# Patient Record
Sex: Male | Born: 1935 | Race: White | Hispanic: No | Marital: Single | Smoking: Former smoker
Health system: Southern US, Community
[De-identification: ages and names within clinical notes are randomized; demographics above are authoritative.]

## PROBLEM LIST (undated history)

## (undated) DIAGNOSIS — K7010 Alcoholic hepatitis without ascites: Secondary | ICD-10-CM

## (undated) DIAGNOSIS — F419 Anxiety disorder, unspecified: Secondary | ICD-10-CM

## (undated) DIAGNOSIS — J449 Chronic obstructive pulmonary disease, unspecified: Secondary | ICD-10-CM

## (undated) DIAGNOSIS — C44621 Squamous cell carcinoma of skin of unspecified upper limb, including shoulder: Secondary | ICD-10-CM

## (undated) DIAGNOSIS — I10 Essential (primary) hypertension: Secondary | ICD-10-CM

## (undated) DIAGNOSIS — F32A Depression, unspecified: Secondary | ICD-10-CM

## (undated) DIAGNOSIS — E785 Hyperlipidemia, unspecified: Secondary | ICD-10-CM

## (undated) DIAGNOSIS — K8050 Calculus of bile duct without cholangitis or cholecystitis without obstruction: Secondary | ICD-10-CM

## (undated) DIAGNOSIS — F329 Major depressive disorder, single episode, unspecified: Secondary | ICD-10-CM

## (undated) DIAGNOSIS — F1910 Other psychoactive substance abuse, uncomplicated: Secondary | ICD-10-CM

## (undated) DIAGNOSIS — N400 Enlarged prostate without lower urinary tract symptoms: Secondary | ICD-10-CM

## (undated) DIAGNOSIS — C4491 Basal cell carcinoma of skin, unspecified: Secondary | ICD-10-CM

## (undated) DIAGNOSIS — C4492 Squamous cell carcinoma of skin, unspecified: Secondary | ICD-10-CM

## (undated) DIAGNOSIS — I714 Abdominal aortic aneurysm, without rupture: Secondary | ICD-10-CM

## (undated) DIAGNOSIS — D649 Anemia, unspecified: Secondary | ICD-10-CM

## (undated) DIAGNOSIS — M1990 Unspecified osteoarthritis, unspecified site: Secondary | ICD-10-CM

## (undated) DIAGNOSIS — J189 Pneumonia, unspecified organism: Secondary | ICD-10-CM

## (undated) DIAGNOSIS — K219 Gastro-esophageal reflux disease without esophagitis: Secondary | ICD-10-CM

## (undated) DIAGNOSIS — G5630 Lesion of radial nerve, unspecified upper limb: Secondary | ICD-10-CM

## (undated) DIAGNOSIS — D334 Benign neoplasm of spinal cord: Secondary | ICD-10-CM

## (undated) HISTORY — DX: Depression, unspecified: F32.A

## (undated) HISTORY — DX: Benign neoplasm of spinal cord: D33.4

## (undated) HISTORY — DX: Anxiety disorder, unspecified: F41.9

## (undated) HISTORY — DX: Alcoholic hepatitis without ascites: K70.10

## (undated) HISTORY — DX: Basal cell carcinoma of skin, unspecified: C44.91

## (undated) HISTORY — DX: Squamous cell carcinoma of skin, unspecified: C44.92

## (undated) HISTORY — DX: Squamous cell carcinoma of skin of unspecified upper limb, including shoulder: C44.621

## (undated) HISTORY — DX: Calculus of bile duct without cholangitis or cholecystitis without obstruction: K80.50

## (undated) HISTORY — DX: Chronic obstructive pulmonary disease, unspecified: J44.9

## (undated) HISTORY — DX: Benign prostatic hyperplasia without lower urinary tract symptoms: N40.0

## (undated) HISTORY — DX: Hyperlipidemia, unspecified: E78.5

## (undated) HISTORY — DX: Major depressive disorder, single episode, unspecified: F32.9

## (undated) HISTORY — DX: Other psychoactive substance abuse, uncomplicated: F19.10

## (undated) HISTORY — DX: Essential (primary) hypertension: I10

## (undated) HISTORY — DX: Pneumonia, unspecified organism: J18.9

## (undated) HISTORY — DX: Lesion of radial nerve, unspecified upper limb: G56.30

## (undated) HISTORY — DX: Anemia, unspecified: D64.9

## (undated) HISTORY — DX: Abdominal aortic aneurysm, without rupture: I71.4

## (undated) NOTE — *Deleted (*Deleted)
Chronic Care Management Pharmacy Assistant   Name: Justin Benson  MRN: [id number] DOB: February 11, 1935  Reason for Encounter: Disease State and Medication Review  Patient Questions:  1.  Have you seen any other providers since your last visit? Yes, 05/10/2020- [name] (PCP)  2.  Any changes in your medicines or health? Yes, Amlodipine 10 mg discontinued 05/10/2020.   PCP : [name], MD  Allergies:   Allergies  Allergen Reactions   Amlodipine Other (See Comments)    transaminitis    Medications: Outpatient Encounter Medications as of 05/31/2020  Medication Sig   atorvastatin (LIPITOR) 10 MG tablet Take 0.5 tablets (5 mg total) by mouth daily.   famotidine (PEPCID) 20 MG tablet TAKE 1 TABLET BY MOUTH EVERYDAY AT BEDTIME   ferrous sulfate 325 (65 FE) MG tablet Take 1 tablet (325 mg total) by mouth every other day.   finasteride (PROSCAR) 5 MG tablet TAKE 1 TABLET BY MOUTH IN  THE EVENING   levothyroxine (SYNTHROID) 25 MCG tablet TAKE 1 TABLET BY MOUTH  DAILY BEFORE BREAKFAST   metFORMIN (GLUCOPHAGE-XR) 500 MG 24 hr tablet Take 2 tablets (1,000 mg total) by mouth 2 (two) times daily.   Multiple Vitamin (MULTIVITAMIN WITH MINERALS) TABS Take 1 tablet by mouth daily.   omeprazole (PRILOSEC) 40 MG capsule Take 1 capsule (40 mg total) by mouth daily.   polyethylene glycol powder (GLYCOLAX/MIRALAX) 17 GM/SCOOP powder Take 8.5 g by mouth daily as needed for moderate constipation. Hold for diarrhea   quinapril (ACCUPRIL) 20 MG tablet TAKE 1 TABLET BY MOUTH AT  BEDTIME   quinapril-hydrochlorothiazide (ACCURETIC) 20-25 MG tablet Take 1 tablet by mouth daily.   sertraline (ZOLOFT) 50 MG tablet Take 1 tablet (50 mg total) by mouth daily.   No facility-administered encounter medications on file as of 05/31/2020.    Current Diagnosis: Patient Active Problem List   Diagnosis Date Noted   Hoarseness 05/10/2020   Constipation 02/28/2020   Carotid stenosis 09/12/2019    Interstitial lung disease ([place]) 09/09/2019   Dysphagia 09/06/2019   Nausea 09/06/2019   Bilateral leg edema 02/09/2019   Iron deficiency anemia 08/30/2017   Borderline hypothyroidism 08/30/2017   Kidney cyst, acquired 05/26/2017   Chronic lower back pain 06/24/2016   Serous otitis media with rupture of tympanic membrane 03/28/2016   Abdominal aortic atherosclerosis ([place]) 06/05/2014   Advanced care planning/counseling discussion 17/49/4496   Alcoholic hepatitis without ascites 06/02/2014   Health maintenance examination 06/02/2014   Medicare annual wellness visit, subsequent 06/01/2013   Weight loss 01/11/2013   Transaminitis 12/01/2012   Right shoulder pain 11/30/2012   Schwannoma of spinal cord ([place]) 11/26/2012   DIVERTICULOSIS OF COLON 12/22/2008   Hyperlipidemia associated with type 2 diabetes mellitus ([place]) 01/10/2007   Alcohol use disorder, severe, in sustained remission ([place]) 01/10/2007   Ex-smoker 01/10/2007   MDD (major depressive disorder), recurrent episode, moderate ([place]) 01/10/2007   COPD (chronic obstructive pulmonary disease) ([place]) 01/10/2007   Benign prostatic hyperplasia 01/10/2007   Type 2 diabetes mellitus with other specified complication ([place]) 75/91/6384   Essential hypertension 01/09/2007    Goals Addressed             This Visit's Progress    Pharmacy Care Plan   Not on track    CARE PLAN ENTRY  Current Barriers:  Chronic Disease Management support, education, and care coordination needs related to Hypertension and Diabetes   Hypertension BP Readings from Last 3 Encounters:  01/18/20 132/60  01/06/20 Marland Kitchen)  142/66  11/18/19 (!) 169/70  Home blood pressure monitoring: weekly basis, 120-130s/60s Pharmacist Clinical Goal(s): Over the next 30 days, patient will work with PharmD and providers to maintain BP goal <140/90 mmHg Current regimen:  Quinapril 20 mg/HCTZ 25 mg - 1 tablet daily Quinapril 20 mg - 1 tablet daily Interventions: Reviewed home  blood pressure monitoring. Blood pressures consistently 130-140s/50-60s Pulse 50-60s. .  Discussed amlodipine discontinuation and patient's choice to defer carvedilol.  Patient self care activities - Over the next 30 days, patient will: Continue current medications as prescribed   Diabetes Lab Results  Component Value Date/Time   HGBA1C 7.4 (H) 09/02/2019 09:03 AM   HGBA1C 7.3 (H) 05/07/2019 11:46 AM  Pharmacist Clinical Goal(s): Over the next 30 days, patient will work with PharmD and providers to achieve A1c goal <7% Current regimen:  Metformin 500 mg XR - 2 tablets twice daily with meals  Interventions: Reviewed blood sugar readings. Fasting readings range: 94-137. Postprandial reading range: 115-219. The majority of patient's readings are at or below goal. Patient identified things that contributed to above goal readings.  Patient self care activities - Over the next 30 days, patient will: Continue to check blood sugar daily. Contact provider with any episodes of hypoglycemia (blood glucose < 70)  Schedule comprehensive eye exam   Please see past updates related to this goal by clicking on the "Past Updates" button in the selected goal        Reviewed chart prior to disease state call. Spoke with patient regarding BP  Recent Office Vitals: BP Readings from Last 3 Encounters:  05/10/20 (!) 150/66  03/22/20 (!) 150/60  02/28/20 (!) 154/62   Pulse Readings from Last 3 Encounters:  05/10/20 66  03/22/20 65  02/28/20 73    Wt Readings from Last 3 Encounters:  05/10/20 128 lb 3 oz (58.1 kg)  03/22/20 127 lb (57.6 kg)  02/28/20 129 lb 3 oz (58.6 kg)     Kidney Function Lab Results  Component Value Date/Time   CREATININE 0.80 09/02/2019 09:03 AM   CREATININE 0.75 05/07/2019 11:46 AM   GFR 92.50 09/02/2019 09:03 AM   GFRNONAA 90 (L) 02/26/2013 11:00 AM   GFRAA >90 02/26/2013 11:00 AM    BMP Latest Ref Rng & Units 09/02/2019 05/07/2019 02/09/2019  Glucose 70 - 99 mg/dL  151(H) 106(H) 116(H)  BUN 6 - 23 mg/dL _0 Creatinine 0.40 - 1.50 mg/dL 0.80 0.75 0.76  Sodium 135 - 145 mEq/L 137 138 138  Potassium 3.5 - 5.1 mEq/L 4.5 4.4 4.9  Chloride 96 - 112 mEq/L 100 99 99  CO2 19 - 32 mEq/L 33(H) 33(H) 34(H)  Calcium 8.4 - 10.5 mg/dL 9.5 9.9 9.6    Current antihypertensive regimen:  Quinapril 20 mg/HCTZ 25 mg- 1 tablet daily Quinapril 20 mg- 1 tablet daily How often are you checking your Blood Pressure? twice daily Current home BP readings: 166/68- morning reading, 145/62- afternoon reading, 170/82- morning reading, 137/61- afternoon reading. What recent interventions/DTPs have been made by any provider to improve Blood Pressure control since last CPP Visit: Amlodipine discontinued by PCP on 05/10/2020, asked to bring BP cuff to next appointment to compare readings. Any recent hospitalizations or ED visits since last visit with CPP? No What diet changes have been made to improve Blood Pressure Control?  Patient states he eats salads for lunch, banana and coffee in the morning with medications. What exercise is being done to improve your Blood Pressure Control?  Patient  did not mention any particular exercise routines but he states he keep pretty busy, takes care of himself.  Adherence Review: Is the patient currently on ACE/ARB medication? Yes- Quinapril  Does the patient have >5 day gap between last estimated fill dates? No  Per Insurance Adherence Review data: ? Medication Adherence for Cholesterol (Statins) (MAC)--- Met: Med Compliance CHOL:  90-99% ? Medication Adherence for Diabetes Medications (MAD)--- Met:  Med Compliance DM:  80-89% ? Medication Adherence for Hypertension (RAS antagonists) [facility])--- Met: Med Compliance HTN:  90-99%    Follow-Up:  Pharmacist Review- Follow up with patient on elevated blood pressure from Medication dispensing call. Patient states his blood pressure are still elevated, he finds that he feel anxious a lot and mostly  when he wakes up, he lives by himself and has to take care of every household chores himself. Patient agreed to check his blood pressure daily and record. Patient aware we will follow up with him next week for Hypertension adherence call.  Debbora Dus, CPP notified.  06/05/2020- Patient called stating when we were reviewing medications on 05/30/2020 for delivery he forgot to mention that he is taking Finasteride 5 mg- 1 tablet daily. Informed patient that medication was not on his profile with [facility] and inquired how many pill he had left. Patient stated he has 2 bottles of this medication from previous pharmacy, he will count pills and call me back.  06/06/2020- Patient returned call and stated he had a total of 93 pills on hand. Informed patient that we will follow up with him monthly to see how many pills he has left. Informed that I will send request to pharmacist Debbora Dus to request PCP to send a prescription to [facility] pharmacy to place on hold for when he needs. Patient agrees with plan and very thankful for the help. Debbora Dus notified.  Pattricia Boss, [place] Pharmacist Assistant [phone]

---

## 1994-07-29 DIAGNOSIS — I10 Essential (primary) hypertension: Secondary | ICD-10-CM

## 1994-07-29 HISTORY — DX: Essential (primary) hypertension: I10

## 1998-04-28 ENCOUNTER — Encounter: Payer: Self-pay | Admitting: Family Medicine

## 2000-02-27 ENCOUNTER — Encounter: Payer: Self-pay | Admitting: Family Medicine

## 2000-02-27 LAB — CONVERTED CEMR LAB
Hgb A1c MFr Bld: 6 %
PSA: 0.6 ng/mL

## 2000-08-29 ENCOUNTER — Encounter: Payer: Self-pay | Admitting: Family Medicine

## 2001-09-26 ENCOUNTER — Encounter: Payer: Self-pay | Admitting: Family Medicine

## 2002-03-29 ENCOUNTER — Encounter: Payer: Self-pay | Admitting: Family Medicine

## 2002-09-27 ENCOUNTER — Encounter: Payer: Self-pay | Admitting: Family Medicine

## 2002-12-28 ENCOUNTER — Encounter: Payer: Self-pay | Admitting: Family Medicine

## 2002-12-28 LAB — CONVERTED CEMR LAB: Hgb A1c MFr Bld: 7.1 %

## 2003-06-29 ENCOUNTER — Encounter: Payer: Self-pay | Admitting: Family Medicine

## 2003-06-29 LAB — CONVERTED CEMR LAB: Hgb A1c MFr Bld: 6.2 %

## 2003-09-27 ENCOUNTER — Encounter: Payer: Self-pay | Admitting: Family Medicine

## 2003-09-27 LAB — CONVERTED CEMR LAB: Microalbumin U total vol: 3.1 mg/L

## 2003-12-28 ENCOUNTER — Encounter: Payer: Self-pay | Admitting: Family Medicine

## 2003-12-28 LAB — CONVERTED CEMR LAB
Hgb A1c MFr Bld: 6.3 %
PSA: 0.9 ng/mL

## 2004-04-28 ENCOUNTER — Encounter: Payer: Self-pay | Admitting: Family Medicine

## 2004-06-14 ENCOUNTER — Ambulatory Visit: Payer: Self-pay | Admitting: Family Medicine

## 2004-06-28 ENCOUNTER — Encounter: Payer: Self-pay | Admitting: Family Medicine

## 2004-06-28 LAB — CONVERTED CEMR LAB: Hgb A1c MFr Bld: 7.4 %

## 2004-07-24 ENCOUNTER — Ambulatory Visit: Payer: Self-pay | Admitting: Family Medicine

## 2004-07-26 ENCOUNTER — Ambulatory Visit: Payer: Self-pay | Admitting: Family Medicine

## 2004-09-05 ENCOUNTER — Ambulatory Visit: Payer: Self-pay | Admitting: Family Medicine

## 2004-09-24 ENCOUNTER — Ambulatory Visit: Payer: Self-pay | Admitting: Family Medicine

## 2004-09-24 ENCOUNTER — Encounter: Admission: RE | Admit: 2004-09-24 | Discharge: 2004-09-24 | Payer: Self-pay | Admitting: Family Medicine

## 2004-11-26 ENCOUNTER — Encounter: Payer: Self-pay | Admitting: Family Medicine

## 2004-11-26 LAB — CONVERTED CEMR LAB: Hgb A1c MFr Bld: 5.9 %

## 2004-11-29 ENCOUNTER — Ambulatory Visit: Payer: Self-pay | Admitting: Family Medicine

## 2004-12-06 ENCOUNTER — Ambulatory Visit: Payer: Self-pay | Admitting: Family Medicine

## 2005-04-28 ENCOUNTER — Encounter: Payer: Self-pay | Admitting: Family Medicine

## 2005-05-23 ENCOUNTER — Ambulatory Visit: Payer: Self-pay | Admitting: Family Medicine

## 2005-05-27 ENCOUNTER — Ambulatory Visit: Payer: Self-pay | Admitting: Family Medicine

## 2005-10-23 ENCOUNTER — Ambulatory Visit: Payer: Self-pay | Admitting: Family Medicine

## 2005-11-21 ENCOUNTER — Ambulatory Visit: Payer: Self-pay | Admitting: Family Medicine

## 2005-11-22 ENCOUNTER — Ambulatory Visit: Payer: Self-pay | Admitting: Family Medicine

## 2005-11-25 ENCOUNTER — Ambulatory Visit: Payer: Self-pay | Admitting: Family Medicine

## 2006-01-10 ENCOUNTER — Ambulatory Visit: Payer: Self-pay | Admitting: Family Medicine

## 2006-01-15 ENCOUNTER — Ambulatory Visit: Payer: Self-pay | Admitting: Family Medicine

## 2006-04-28 ENCOUNTER — Encounter: Payer: Self-pay | Admitting: Family Medicine

## 2006-05-26 ENCOUNTER — Ambulatory Visit: Payer: Self-pay | Admitting: Family Medicine

## 2006-05-29 ENCOUNTER — Ambulatory Visit: Payer: Self-pay | Admitting: Family Medicine

## 2006-06-28 ENCOUNTER — Encounter: Payer: Self-pay | Admitting: Family Medicine

## 2006-06-28 LAB — CONVERTED CEMR LAB: Hgb A1c MFr Bld: 6.3 %

## 2006-07-08 ENCOUNTER — Ambulatory Visit: Payer: Self-pay | Admitting: Family Medicine

## 2006-07-10 ENCOUNTER — Ambulatory Visit: Payer: Self-pay | Admitting: Family Medicine

## 2006-09-27 ENCOUNTER — Encounter: Payer: Self-pay | Admitting: Family Medicine

## 2006-10-10 ENCOUNTER — Ambulatory Visit: Payer: Self-pay | Admitting: Family Medicine

## 2006-10-10 LAB — CONVERTED CEMR LAB
ALT: 36 units/L (ref 0–40)
AST: 33 units/L (ref 0–37)
BUN: 18 mg/dL (ref 6–23)
Calcium: 9.1 mg/dL (ref 8.4–10.5)
Chloride: 105 meq/L (ref 96–112)
Creatinine, Ser: 0.9 mg/dL (ref 0.4–1.5)
PSA: 1.32 ng/mL (ref 0.10–4.00)
Potassium: 4.7 meq/L (ref 3.5–5.1)
VLDL: 17 mg/dL (ref 0–40)

## 2006-10-14 ENCOUNTER — Ambulatory Visit: Payer: Self-pay | Admitting: Family Medicine

## 2006-11-27 ENCOUNTER — Ambulatory Visit: Payer: Self-pay | Admitting: Family Medicine

## 2007-01-08 ENCOUNTER — Ambulatory Visit: Payer: Self-pay | Admitting: Family Medicine

## 2007-01-08 LAB — CONVERTED CEMR LAB
Bilirubin Urine: NEGATIVE
Glucose, Urine, Semiquant: NEGATIVE
Hgb A1c MFr Bld: 6 % (ref 4.6–6.0)
Mucus, UA: 0
Nitrite: NEGATIVE
Protein, U semiquant: NEGATIVE
Specific Gravity, Urine: <1.005
Urine crystals, microscopic: 0 /hpf
Urobilinogen, UA: 0.2
pH: 7

## 2007-01-09 ENCOUNTER — Encounter: Payer: Self-pay | Admitting: Family Medicine

## 2007-01-09 DIAGNOSIS — I10 Essential (primary) hypertension: Secondary | ICD-10-CM | POA: Insufficient documentation

## 2007-01-09 DIAGNOSIS — E1169 Type 2 diabetes mellitus with other specified complication: Secondary | ICD-10-CM

## 2007-01-10 DIAGNOSIS — F1010 Alcohol abuse, uncomplicated: Secondary | ICD-10-CM

## 2007-01-10 DIAGNOSIS — N400 Enlarged prostate without lower urinary tract symptoms: Secondary | ICD-10-CM

## 2007-01-10 DIAGNOSIS — Z87891 Personal history of nicotine dependence: Secondary | ICD-10-CM | POA: Insufficient documentation

## 2007-01-10 DIAGNOSIS — F331 Major depressive disorder, recurrent, moderate: Secondary | ICD-10-CM

## 2007-01-10 DIAGNOSIS — E785 Hyperlipidemia, unspecified: Secondary | ICD-10-CM

## 2007-01-10 DIAGNOSIS — J449 Chronic obstructive pulmonary disease, unspecified: Secondary | ICD-10-CM

## 2007-01-12 ENCOUNTER — Ambulatory Visit: Payer: Self-pay | Admitting: Family Medicine

## 2007-05-04 ENCOUNTER — Ambulatory Visit: Payer: Self-pay | Admitting: Family Medicine

## 2007-05-12 ENCOUNTER — Ambulatory Visit: Payer: Self-pay | Admitting: Family Medicine

## 2007-05-12 DIAGNOSIS — K921 Melena: Secondary | ICD-10-CM | POA: Insufficient documentation

## 2007-06-15 ENCOUNTER — Ambulatory Visit: Payer: Self-pay | Admitting: Family Medicine

## 2007-06-15 LAB — CONVERTED CEMR LAB
ALT: 26 units/L (ref 0–53)
AST: 20 units/L (ref 0–37)
Albumin: 4 g/dL (ref 3.5–5.2)
Basophils Absolute: 0 10*3/uL (ref 0.0–0.1)
Calcium: 9.2 mg/dL (ref 8.4–10.5)
Chloride: 102 meq/L (ref 96–112)
Creatinine, Ser: 0.9 mg/dL (ref 0.4–1.5)
Creatinine,U: 119.2 mg/dL
Eosinophils Relative: 1.6 % (ref 0.0–5.0)
HCT: 43 % (ref 39.0–52.0)
LDL Cholesterol: 73 mg/dL (ref 0–99)
MCHC: 33.5 g/dL (ref 30.0–36.0)
Neutrophils Relative %: 52.4 % (ref 43.0–77.0)
PSA: 1.37 ng/mL (ref 0.10–4.00)
Platelets: 228 10*3/uL (ref 150–400)
RBC: 4.65 M/uL (ref 4.22–5.81)
RDW: 12.9 % (ref 11.5–14.6)
Sodium: 138 meq/L (ref 135–145)
TSH: 2.7 microintl units/mL (ref 0.35–5.50)
Total Bilirubin: 0.7 mg/dL (ref 0.3–1.2)
Total CHOL/HDL Ratio: 3.6
WBC: 4 10*3/uL — ABNORMAL LOW (ref 4.5–10.5)

## 2007-06-17 ENCOUNTER — Ambulatory Visit: Payer: Self-pay | Admitting: Family Medicine

## 2007-06-18 ENCOUNTER — Encounter: Payer: Self-pay | Admitting: Family Medicine

## 2007-07-17 ENCOUNTER — Ambulatory Visit: Payer: Self-pay | Admitting: Family Medicine

## 2007-07-20 ENCOUNTER — Encounter (INDEPENDENT_AMBULATORY_CARE_PROVIDER_SITE_OTHER): Payer: Self-pay | Admitting: *Deleted

## 2007-11-30 ENCOUNTER — Telehealth: Payer: Self-pay | Admitting: Family Medicine

## 2007-12-09 ENCOUNTER — Ambulatory Visit: Payer: Self-pay | Admitting: Family Medicine

## 2007-12-09 LAB — CONVERTED CEMR LAB: Hgb A1c MFr Bld: 6.3 % — ABNORMAL HIGH (ref 4.6–6.0)

## 2007-12-14 ENCOUNTER — Ambulatory Visit: Payer: Self-pay | Admitting: Family Medicine

## 2008-05-24 ENCOUNTER — Encounter (INDEPENDENT_AMBULATORY_CARE_PROVIDER_SITE_OTHER): Payer: Self-pay | Admitting: *Deleted

## 2008-06-27 ENCOUNTER — Ambulatory Visit: Payer: Self-pay | Admitting: Family Medicine

## 2008-06-27 LAB — CONVERTED CEMR LAB
ALT: 61 units/L — ABNORMAL HIGH (ref 0–53)
Basophils Absolute: 0 10*3/uL (ref 0.0–0.1)
Basophils Relative: 0.1 % (ref 0.0–3.0)
Bilirubin, Direct: 0.1 mg/dL (ref 0.0–0.3)
Calcium: 9.1 mg/dL (ref 8.4–10.5)
Cholesterol: 170 mg/dL (ref 0–200)
Creatinine, Ser: 0.9 mg/dL (ref 0.4–1.5)
Creatinine,U: 226.1 mg/dL
GFR calc Af Amer: 107 mL/min
HCT: 42 % (ref 39.0–52.0)
Hemoglobin: 14.5 g/dL (ref 13.0–17.0)
MCHC: 34.6 g/dL (ref 30.0–36.0)
MCV: 89.8 fL (ref 78.0–100.0)
Microalb Creat Ratio: 4 mg/g (ref 0.0–30.0)
Microalb, Ur: 0.9 mg/dL (ref 0.0–1.9)
Monocytes Absolute: 0.6 10*3/uL (ref 0.1–1.0)
Neutro Abs: 1.9 10*3/uL (ref 1.4–7.7)
PSA: 1.17 ng/mL (ref 0.10–4.00)
RBC: 4.67 M/uL (ref 4.22–5.81)
RDW: 13.6 % (ref 11.5–14.6)
Sodium: 143 meq/L (ref 135–145)
TSH: 2.61 microintl units/mL (ref 0.35–5.50)
Total Bilirubin: 0.9 mg/dL (ref 0.3–1.2)
Total Protein: 6.8 g/dL (ref 6.0–8.3)
Triglycerides: 140 mg/dL (ref 0–149)

## 2008-06-29 ENCOUNTER — Ambulatory Visit: Payer: Self-pay | Admitting: Family Medicine

## 2008-11-01 ENCOUNTER — Telehealth: Payer: Self-pay | Admitting: Family Medicine

## 2008-11-09 ENCOUNTER — Ambulatory Visit: Payer: Self-pay | Admitting: Family Medicine

## 2008-11-09 LAB — FECAL OCCULT BLOOD, GUAIAC: Fecal Occult Blood: POSITIVE

## 2008-11-09 LAB — CONVERTED CEMR LAB: OCCULT 3: POSITIVE

## 2008-11-10 ENCOUNTER — Ambulatory Visit: Payer: Self-pay | Admitting: Family Medicine

## 2008-11-26 HISTORY — PX: COLONOSCOPY: SHX174

## 2008-12-20 ENCOUNTER — Ambulatory Visit: Payer: Self-pay | Admitting: Gastroenterology

## 2008-12-21 ENCOUNTER — Encounter: Payer: Self-pay | Admitting: Gastroenterology

## 2008-12-21 ENCOUNTER — Ambulatory Visit: Payer: Self-pay | Admitting: Gastroenterology

## 2008-12-21 LAB — HM COLONOSCOPY

## 2008-12-22 DIAGNOSIS — D126 Benign neoplasm of colon, unspecified: Secondary | ICD-10-CM

## 2008-12-22 DIAGNOSIS — K5730 Diverticulosis of large intestine without perforation or abscess without bleeding: Secondary | ICD-10-CM | POA: Insufficient documentation

## 2008-12-26 ENCOUNTER — Encounter: Payer: Self-pay | Admitting: Gastroenterology

## 2008-12-28 ENCOUNTER — Ambulatory Visit: Payer: Self-pay | Admitting: Family Medicine

## 2008-12-28 LAB — CONVERTED CEMR LAB: Hgb A1c MFr Bld: 6.7 % — ABNORMAL HIGH (ref 4.6–6.5)

## 2009-01-02 ENCOUNTER — Ambulatory Visit: Payer: Self-pay | Admitting: Family Medicine

## 2009-01-16 ENCOUNTER — Ambulatory Visit: Payer: Self-pay | Admitting: Family Medicine

## 2009-04-05 ENCOUNTER — Ambulatory Visit: Payer: Self-pay | Admitting: Family Medicine

## 2009-04-19 ENCOUNTER — Ambulatory Visit: Payer: Self-pay | Admitting: Family Medicine

## 2009-07-17 ENCOUNTER — Ambulatory Visit: Payer: Self-pay | Admitting: Family Medicine

## 2009-07-17 LAB — CONVERTED CEMR LAB
ALT: 42 units/L (ref 0–53)
AST: 27 units/L (ref 0–37)
Alkaline Phosphatase: 55 units/L (ref 39–117)
BUN: 17 mg/dL (ref 6–23)
Basophils Relative: 0.1 % (ref 0.0–3.0)
Chloride: 102 meq/L (ref 96–112)
Creatinine,U: 107.5 mg/dL
Eosinophils Absolute: 0.1 10*3/uL (ref 0.0–0.7)
Eosinophils Relative: 1.5 % (ref 0.0–5.0)
GFR calc non Af Amer: 88.14 mL/min (ref >60)
Hemoglobin: 14.4 g/dL (ref 13.0–17.0)
Hgb A1c MFr Bld: 6.3 % (ref 4.6–6.5)
LDL Cholesterol: 70 mg/dL (ref 0–99)
Lymphocytes Relative: 23.2 % (ref 12.0–46.0)
MCHC: 33.6 g/dL (ref 30.0–36.0)
Microalb, Ur: 0.3 mg/dL (ref 0.0–1.9)
Monocytes Relative: 14.2 % — ABNORMAL HIGH (ref 3.0–12.0)
Neutro Abs: 3.2 10*3/uL (ref 1.4–7.7)
Neutrophils Relative %: 61 % (ref 43.0–77.0)
PSA: 1.24 ng/mL (ref 0.10–4.00)
Potassium: 4.6 meq/L (ref 3.5–5.1)
RBC: 4.53 M/uL (ref 4.22–5.81)
Sodium: 140 meq/L (ref 135–145)
Total Bilirubin: 0.8 mg/dL (ref 0.3–1.2)
Total CHOL/HDL Ratio: 3
VLDL: 25.8 mg/dL (ref 0.0–40.0)
WBC: 5.2 10*3/uL (ref 4.5–10.5)

## 2009-08-14 ENCOUNTER — Telehealth: Payer: Self-pay | Admitting: Family Medicine

## 2009-08-28 ENCOUNTER — Ambulatory Visit: Payer: Self-pay | Admitting: Family Medicine

## 2009-10-23 ENCOUNTER — Encounter: Payer: Self-pay | Admitting: Family Medicine

## 2009-10-30 ENCOUNTER — Ambulatory Visit (HOSPITAL_BASED_OUTPATIENT_CLINIC_OR_DEPARTMENT_OTHER): Admission: RE | Admit: 2009-10-30 | Discharge: 2009-10-30 | Payer: Self-pay | Admitting: Urology

## 2009-10-30 HISTORY — PX: CIRCUMCISION: SUR203

## 2009-11-07 ENCOUNTER — Encounter: Payer: Self-pay | Admitting: Family Medicine

## 2009-11-22 ENCOUNTER — Telehealth: Payer: Self-pay | Admitting: Family Medicine

## 2009-12-19 ENCOUNTER — Encounter: Payer: Self-pay | Admitting: Family Medicine

## 2010-03-06 ENCOUNTER — Encounter (INDEPENDENT_AMBULATORY_CARE_PROVIDER_SITE_OTHER): Payer: Self-pay | Admitting: *Deleted

## 2010-05-17 ENCOUNTER — Encounter: Payer: Self-pay | Admitting: Family Medicine

## 2010-05-29 ENCOUNTER — Ambulatory Visit: Payer: Self-pay | Admitting: Family Medicine

## 2010-06-06 ENCOUNTER — Ambulatory Visit: Payer: Self-pay | Admitting: Family Medicine

## 2010-08-30 NOTE — Letter (Signed)
Summary: [facility]  [facility],Note   Imported By: Beau Fanny 12/20/2009 13:21:28  _____________________________________________________________________  External Attachment:    Type:   Image     Comment:   External Document

## 2010-08-30 NOTE — Assessment & Plan Note (Signed)
Summary: FOLLOW-UP/JRR   Vital Signs:  Patient profile:   76 year old male Weight:      155 pounds BMI:     23.65 Temp:     97.4 degrees F oral Pulse rate:   64 / minute Pulse rhythm:   regular BP sitting:   124 / 68  (left arm) Cuff size:   regular  Vitals Entered By: Sydell Axon LPN (June 06, 2010 8:05 AM) CC: Follow-up visit and head congestion   History of Present Illness: Pt here for followup. He started with congestion about three days ago and has gotten slightly more stopped up. He denies fever or chills. He feels ok otherwise. His A1C has increased. He has been eating too much with a "little" ice cream here and there. We discussed progressive nature of the diabetes disease. He went to [place] since being here and had a great time. Airplane was full of hacking, coughing people.  Problems Prior to Update: 1)  Phimosis (DR TANNENBAUM)  (ICD-605) 2)  Diverticulosis of Colon  (ICD-562.10) 3)  Hemorrhoids, Int and Ext  (ICD-455.6) 4)  Colonic Polyps  (ICD-211.3) 5)  Blood in Stool  (ICD-578.1) 6)  Screening For Malignannt Neoplasm, Site Nec  (ICD-V76.49) 7)  Balanitis  (ICD-607.1) 8)  Benign Prostatic Hypertrophy  (ICD-600.00) 9)  Hyperlipidemia  (ICD-272.4) 10)  Depression  (ICD-311) 11)  COPD  (ICD-496) 12)  Hypertension  (ICD-401.9) 13)  Nicotine Addiction Quit 1994 80pyh  (ICD-305.1) 14)  Abuse, Alcohol, in Remission (QUIT 06/1999) H/o Incr Lfts  (ICD-305.03) 15)  Diabetes Mellitus, Type II  (ICD-250.00)  Medications Prior to Update: 1)  Cardura 8 Mg Tabs (Doxazosin Mesylate) .... Take One By Mouth Daily 2)  Lipitor 10 Mg Tabs (Atorvastatin Calcium) .Marland Kitchen.. 1 Daily By Mouth At Bedtime 3)  Quinapril Hcl 40 Mg Tabs (Quinapril Hcl) .Marland Kitchen.. 1 Daily By Mouth 4)  Quinapril-Hydrochlorothiazide 10-12.5 Mg Tabs (Quinapril-Hydrochlorothiazide) .Marland Kitchen.. 1 Daily By Mouth 5)  Bayer Low Strength 81 Mg  Tbec (Aspirin) .Marland Kitchen.. 1 By Mouth Daily 6)  Tonic H2o .... Daily As Needed 7)  Hca  Multivitamin/minerals   Tabs (Multiple Vitamins-Minerals) .... Take One By Mouth Daily 8)  Metformin Hcl 500 Mg Tabs (Metformin Hcl) .... One Tab By Mouth Twice A Day. 9)  Sertraline Hcl 100 Mg Tabs (Sertraline Hcl) .... 1/2 Tablet By Mouth Daily  Allergies: No Known Drug Allergies  Physical Exam  General:  Well-developed,well-nourished,in no acute distress; alert,appropriate and cooperative throughout examination, congested with clear discharge. Head:  Normocephalic and atraumatic without obvious abnormalities. No apparent alopecia or balding, has mild thinning of hair. Sinuses NT. Eyes:  Conjunctiva clear bilaterally.  Ears:  External ear exam shows no significant lesions or deformities.  Otoscopic examination reveals clear canals, tympanic membranes are intact bilaterally without bulging, retraction, inflammation or discharge. Hearing is grossly normal bilaterally. Nose:  External nasal examination shows no deformity or inflammation. Nasal mucosa are pink and moist without lesions. Nose congested with clear discharge, L>R. Mouth:  Oral mucosa and oropharynx without lesions or exudates.  Teeth in good repair. Mild PND. Neck:  No deformities, masses, or tenderness noted. Chest Wall:  No deformities, masses, tenderness or gynecomastia noted. Lungs:  Normal respiratory effort, chest expands symmetrically. Lungs are clear to auscultation, no crackles or wheezes. Heart:  Normal rate and regular rhythm. S1 and S2 normal without gallop, murmur, click, rub or other extra sounds. Abdomen:  Bowel sounds positive,abdomen soft and non-tender without masses, organomegaly or hernias noted.  Impression & Recommendations:  Problem # 1:  URI (ICD-465.9) Assessment New Appears standard cold. See instructions. His updated medication list for this problem includes:    Bayer Low Strength 81 Mg Tbec (Aspirin) .Marland Kitchen... 1 by mouth daily  Problem # 2:  DIABETES MELLITUS, TYPE II (ICD-250.00) Assessment:  Deteriorated  A1C significantly elevated over usual level. He will tighten up. His updated medication list for this problem includes:    Quinapril Hcl 40 Mg Tabs (Quinapril hcl) .Marland Kitchen... 1 daily by mouth    Quinapril-hydrochlorothiazide 10-12.5 Mg Tabs (Quinapril-hydrochlorothiazide) .Marland Kitchen... 1 daily by mouth    Bayer Low Strength 81 Mg Tbec (Aspirin) .Marland Kitchen... 1 by mouth daily    Metformin Hcl 500 Mg Tabs (Metformin hcl) ..... One tab by mouth twice a day.  Labs Reviewed: Creat: 0.9 (07/17/2009)   Microalbumin: 2.4 (04/28/2006)  Last Eye Exam: normal (02/10/2008) Reviewed HgBA1c results: 7.0 (05/29/2010)  6.3 (07/17/2009)  Problem # 3:  PHIMOSIS (DR TANNENBAUM) (ICD-605) Assessment: Improved Circumcision has greatly improved chronic hygiene.   Problem # 4:  BENIGN PROSTATIC HYPERTROPHY (ICD-600.00) Assessment: Improved Switched from Cardura to Generic Flomax.Marland KitchenMarland KitchenBp stable and sxs improved. Has not helped his love life!  Problem # 5:  HYPERTENSION (ICD-401.9) Assessment: Unchanged Stable with stopping Cardura. The following medications were removed from the medication list:    Cardura 8 Mg Tabs (Doxazosin mesylate) .Marland Kitchen... Take one by mouth daily His updated medication list for this problem includes:    Quinapril Hcl 40 Mg Tabs (Quinapril hcl) .Marland Kitchen... 1 daily by mouth    Quinapril-hydrochlorothiazide 10-12.5 Mg Tabs (Quinapril-hydrochlorothiazide) .Marland Kitchen... 1 daily by mouth  BP today: 124/68 Prior BP: 138/60 (08/28/2009)  Prior 10 Yr Risk Heart Disease: 14 % (01/02/2009)  Labs Reviewed: K+: 4.6 (07/17/2009) Creat: : 0.9 (07/17/2009)   Chol: 138 (07/17/2009)   HDL: 42.00 (07/17/2009)   LDL: 70 (07/17/2009)   TG: 129.0 (07/17/2009)  Complete Medication List: 1)  Lipitor 10 Mg Tabs (Atorvastatin calcium) .Marland Kitchen.. 1 daily by mouth at bedtime 2)  Quinapril Hcl 40 Mg Tabs (Quinapril hcl) .Marland Kitchen.. 1 daily by mouth 3)  Quinapril-hydrochlorothiazide 10-12.5 Mg Tabs (Quinapril-hydrochlorothiazide) .Marland Kitchen.. 1  daily by mouth 4)  Bayer Low Strength 81 Mg Tbec (Aspirin) .Marland Kitchen.. 1 by mouth daily 5)  Tonic H2o  .... Daily as needed 6)  Hca Multivitamin/minerals Tabs (Multiple vitamins-minerals) .... Take one by mouth daily 7)  Metformin Hcl 500 Mg Tabs (Metformin hcl) .... One tab by mouth twice a day. 8)  Sertraline Hcl 100 Mg Tabs (Sertraline hcl) .... 1/2 tablet by mouth daily 9)  Tamsulosin Hcl 0.4 Mg Caps (Tamsulosin hcl) .... Take one by mouth at bedtime  Patient Instructions: 1)  RTC mos for Comp Exam, labs prior. 2)  Take Guaifenesin by going to [facility] or [facility] and getting MUCOUS RELIEF EXPECTORANT (400mg ), take 11/2 tabs by mouth AM and NOON. 3)  Drink lots of fluids anytime taking Guaifenesin.    Orders Added: 1)  Est. Patient Level IV [27253]    Current Allergies (reviewed today): No known allergies

## 2010-08-30 NOTE — Letter (Signed)
Summary: Nadara Eaton letter  [place] at [facility]  [address] [place], [place] [address]   Phone: [phone]  Fax: [phone]       03/06/2010 MRN: [id number]  [facility] [address] [place], [place]  [address]  Dear Mr. [facility] [place] [place] announce the retirement of Arta Silence, M.D., from full-time practice at the [facility] office effective January 25, 2010 and his plans of returning part-time.  It is important to Dr. Hetty Ely and to our practice that you understand that [facility] has seven physicians in our office for your health care needs.  We will continue to offer the same exceptional care that you have today.    Dr. Hetty Ely has spoken to many of you about his plans for retirement and returning part-time in the fall.   We will continue to work with you through the transition to schedule appointments for you in the office and meet the high standards that [place] is committed to.   Again, it is with great pleasure that we share the news that Dr. Hetty Ely will return to [facility] at [facility] in October of 2011 with a reduced schedule.    If you have any questions, or would like to request an appointment with one of our physicians, please call us at [phone] and press the option for Scheduling an appointment.  We take pleasure in providing you with excellent patient care and look forward to seeing you at your next office visit.  Our [facility] Physicians are:  Tillman Abide, M.D. Laurita Quint, M.D. Roxy Manns, M.D. Kerby Nora, M.D. Hannah Beat, M.D. Ruthe Mannan, M.D. We proudly welcomed Raechel Ache, M.D. and Eustaquio Boyden, M.D. to the practice in July/August 2011.  Sincerely,  [place] [facility]

## 2010-08-30 NOTE — Progress Notes (Signed)
Summary: Rx Sertraline & Quinapril/HCTZ  Phone Note Refill Request Call back at [phone] Message from:  [facility] on November 22, 2009 1:26 PM  Refills Requested: Medication #1:  SERTRALINE HCL 100 MG TABS 1/2 tablet by mouth daily.   Supply Requested: 3 months  Medication #2:  QUINAPRIL-HYDROCHLOROTHIAZIDE 10-12.5 MG TABS 1 daily by mouth   Supply Requested: 3 months Received faxed refill request, form in your IN box.   Method Requested: Fax to Mail Away Pharmacy Initial call taken by: Linde Gillis CMA Duncan Dull),  November 22, 2009 1:27 PM    Prescriptions: SERTRALINE HCL 100 MG TABS (SERTRALINE HCL) 1/2 tablet by mouth daily  #45 x 3   Entered and Authorized by:   Shaune Leeks MD   Signed by:   Shaune Leeks MD on 11/22/2009   Method used:   Electronically to        [facility]* (mail-order)             ,          Ph: [phone]       Fax: [phone]   RxID:   [id number] METFORMIN HCL 500 MG TABS (METFORMIN HCL) one tab by mouth twice a day.  #180 x 4   Entered and Authorized by:   Shaune Leeks MD   Signed by:   Shaune Leeks MD on 11/22/2009   Method used:   Electronically to        [facility]* (mail-order)             ,          Ph: [phone]       Fax: [phone]   RxID:   [id number] QUINAPRIL-HYDROCHLOROTHIAZIDE 10-12.5 MG TABS (QUINAPRIL-HYDROCHLOROTHIAZIDE) 1 daily by mouth  #90 x 3   Entered and Authorized by:   Shaune Leeks MD   Signed by:   Shaune Leeks MD on 11/22/2009   Method used:   Electronically to        [facility]* (mail-order)             ,          Ph: [phone]       Fax: [phone]   RxID:   [id number] QUINAPRIL HCL 40 MG TABS (QUINAPRIL HCL) 1 daily by mouth  #90 x 3   Entered and Authorized by:   Shaune Leeks MD   Signed by:   Shaune Leeks MD on 11/22/2009   Method used:   Electronically to        [facility]* (mail-order)             ,        Ph: [phone]       Fax: [phone]   RxID:   [id number] LIPITOR 10 MG TABS (ATORVASTATIN CALCIUM) 1 daily by mouth at bedtime  #90 x 3   Entered and Authorized by:   Shaune Leeks MD   Signed by:   Shaune Leeks MD on 11/22/2009   Method used:   Electronically to        [facility]* (mail-order)             ,          Ph: [phone]       Fax: [phone]   RxID:   [id number]

## 2010-08-30 NOTE — Assessment & Plan Note (Signed)
Summary: RESCHEDULED CPX... CYD   Vital Signs:  Patient profile:   76 year old male Weight:      152 pounds Temp:     97.8 degrees F oral Pulse rate:   64 / minute Pulse rhythm:   regular BP sitting:   138 / 60  (left arm) Cuff size:   regular  Vitals Entered By: Sydell Axon LPN (August 28, 2009 10:48 AM) CC: 30 Minute checkup, had a colonoscopy 05/10 by Dr. Christella Hartigan   History of Present Illness: Pt here for followup. He is doing well and has no complaints.  Preventive Screening-Counseling & Management  Alcohol-Tobacco     Alcohol drinks/day: 0     Alcohol type: Rare wine     Smoking Status: quit     Year Quit: 1994     Pack years: 66     Passive Smoke Exposure: no  Caffeine-Diet-Exercise     Caffeine use/day: 2     Does Patient Exercise: yes     Type of exercise: walks     Times/week: 5  Problems Prior to Update: 1)  Diverticulosis of Colon  (ICD-562.10) 2)  Hemorrhoids, Int and Ext  (ICD-455.6) 3)  Colonic Polyps  (ICD-211.3) 4)  Shoulder Pain, Right  (ICD-719.41) 5)  Unspecified Site of Sprain and Strain, L Elbow  (ICD-848.9) 6)  Other Specified Heat Effects  (ICD-992.8) 7)  Blood in Stool  (ICD-578.1) 8)  Screening For Malignannt Neoplasm, Site Nec  (ICD-V76.49) 9)  Balanitis  (ICD-607.1) 10)  Benign Prostatic Hypertrophy  (ICD-600.00) 11)  Hyperlipidemia  (ICD-272.4) 12)  Depression  (ICD-311) 13)  COPD  (ICD-496) 14)  Hypertension  (ICD-401.9) 15)  Nicotine Addiction Quit 1994 80pyh  (ICD-305.1) 16)  Abuse, Alcohol, 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 Take One By Mouth Daily 2)  Lipitor 10 Mg Tabs (Atorvastatin Calcium) .Marland Kitchen.. 1 Daily By Mouth At Bedtime 3)  Quinapril Hcl 40 Mg Tabs (Quinapril Hcl) .Marland Kitchen.. 1 Daily By Mouth 4)  Quinapril-Hydrochlorothiazide 10-12.5 Mg Tabs (Quinapril-Hydrochlorothiazide) .Marland Kitchen.. 1 Daily  By Mouth 5)  Bayer Low Strength 81 Mg  Tbec (Aspirin) .Marland Kitchen.. 1 By Mouth Daily 6)  Tonic H2o .... Daily As Needed 7)  Hca Multivitamin/minerals   Tabs (Multiple Vitamins-Minerals) .... Take One By Mouth Daily 8)  Metformin Hcl 500 Mg Tabs (Metformin Hcl) .... One Tab By Mouth Twice A Day. 9)  Sertraline Hcl 100 Mg Tabs (Sertraline Hcl) .... 1/2 Tablet By Mouth Daily  Allergies: No Known Drug Allergies  Past History:  Past Medical History: Last updated: 12/20/2008 Diabetes mellitus, type II (10/2000) Hypertension (1996) COPD Depression Hyperlipidemia Benign prostatic hypertrophy   Past Surgical History: Last updated: 12/28/2008 Pneumonia as baby  Colonoscopy Polyp x 2 Removed Divertics Int/Ext Hemms (Dr Christella Hartigan) 12/22/08         repeat 2015  Family History: Last updated: 08/28/2009 Father dec 84 Prostate Ca Aortic Aneurysm Stroke Mother dec 30s Tubal Childbirth Brother dec 48  Hodgkin's at [facility] 1/2  A 50s Prostate Ca  Brother 1/2  A 50s Prostate Ca Brother A  65 DM ([place]) Sister dec Cerebral Hemm Sister A Lupus no colon cancer  Social History: Last updated: 12/20/2008 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 Divorced since 1979  Active Sexually   1 Daughter 22, widowed  Former Smoker quit 1994 80pyh Alcohol use-no  excessive none since 06/1999 Drug use-no  Risk Factors: Alcohol Use: 0 (08/28/2009) Caffeine Use: 2 (08/28/2009) Exercise: yes (08/28/2009)  Risk Factors: Smoking Status: quit (08/28/2009) Passive Smoke Exposure: no (08/28/2009)  Family History: Father dec 84 Prostate Ca Aortic Aneurysm Stroke Mother dec 30s Tubal Childbirth Brother dec 48  Hodgkin's at [facility] 1/2  A 50s Prostate Ca  Brother 1/2  A 50s Prostate Ca Brother A  65 DM ([place]) Sister dec Cerebral Hemm Sister A Lupus no colon cancer  Review of Systems General:  Denies chills, fatigue, fever, sweats, weakness, and weight loss. Eyes:  Denies blurring,  discharge, and eye pain. ENT:  Complains of ringing in ears; denies decreased hearing and earache; chronic. CV:  Complains of shortness of breath with exertion; denies chest pain or discomfort, difficulty breathing while lying down, fainting, fatigue, and palpitations; stable. Resp:  Denies cough, shortness of breath, and wheezing. GI:  Complains of hemorrhoids and indigestion; denies abdominal pain, bloody stools, change in bowel habits, constipation, dark tarry stools, diarrhea, loss of appetite, nausea, vomiting, vomiting blood, and yellowish skin color; stable, rare heartburn. GU:  Complains of nocturia; denies discharge, dysuria, and urinary frequency; couple times with lots of liquids.. MS:  Complains of joint pain; denies joint swelling, low back pain, muscle aches, muscle weakness, and stiffness; right hip pain, wakes him up at times.. Derm:  Denies dryness, itching, and rash. Neuro:  Denies numbness, poor balance, tingling, and tremors.  Physical Exam  General:  Well-developed,well-nourished,in no acute distress; alert,appropriate and cooperative throughout examination Head:  Normocephalic and atraumatic without obvious abnormalities. No apparent alopecia or balding, has mild thinning of hair. Eyes:  Conjunctiva clear bilaterally.  Ears:  External ear exam shows no significant lesions or deformities.  Otoscopic examination reveals clear canals, tympanic membranes are intact bilaterally without bulging, retraction, inflammation or discharge. Hearing is grossly normal bilaterally. Nose:  External nasal examination shows no deformity or inflammation. Nasal mucosa are pink and moist without lesions or exudates. Mouth:  Oral mucosa and oropharynx without lesions or exudates.  Teeth in good repair. Neck:  No deformities, masses, or tenderness noted. Chest Wall:  No deformities, masses, tenderness or gynecomastia noted. Breasts:  No masses or gynecomastia noted Lungs:  Normal respiratory  effort, chest expands symmetrically. Lungs are clear to auscultation, no crackles or wheezes. Heart:  Normal rate and regular rhythm. S1 and S2 normal without gallop, murmur, click, rub or other extra sounds. Abdomen:  Bowel sounds positive,abdomen soft and non-tender without masses, organomegaly or hernias noted. Rectal:  No external abnormalities noted. Normal sphincter tone. No rectal masses or tenderness. Incr sphincter tone. Genitalia:  Testes bilaterally descended without nodularity, tenderness or masses. No scrotal masses or lesions. No penis lesions or urethral discharge. Prostate:  Prostate gland firm and smooth, no enlargement, nodularity, tenderness, mass, asymmetry or induration. 20 gms. Msk:  No deformity or scoliosis noted of thoracic or lumbar spine.   Pulses:  R and L carotid,radial,femoral,dorsalis pedis and posterior tibial pulses are full and equal bilaterally Extremities:  No clubbing, cyanosis, edema, or deformity noted with normal full range of motion of all joints.   Neurologic:  No cranial nerve deficits noted. Station and gait are normal. Plantar reflexes are down-going bilaterally. DTRs are symmetrical throughout. Sensory, motor and coordinative functions appear intact. Skin:  multiple Aks thick and irritated on the dorsum of the hands, some near prominent veins. No worrisomer lesions seen globally. Cervical Nodes:  No lymphadenopathy noted Inguinal Nodes:  No significant  adenopathy Psych:  Cognition and judgment appear intact. Alert and cooperative with normal attention span and concentration. No apparent delusions, illusions, hallucinations   Impression & Recommendations:  Problem # 1:  DIVERTICULOSIS OF COLON (ICD-562.10) Assessment Unchanged  Discussed being sseen for LLQ discomfort for more than two days.  Colonoscopy:  Labs Reviewed: Hgb: 14.4 (07/17/2009)   Hct: 42.8 (07/17/2009)   WBC: 5.2 (07/17/2009)  Problem # 2:  HEMORRHOIDS, INT AND EXT  (ICD-455.6) Assessment: Unchanged Stable.  Problem # 3:  COLONIC POLYPS (ICD-211.3) Assessment: Unchanged Just had colonoscopy last Fall.  Problem # 4:  HYPERLIPIDEMIA (ICD-272.4) Assessment: Unchanged Adequate. His updated medication list for this problem includes:    Lipitor 10 Mg Tabs (Atorvastatin calcium) .Marland Kitchen... 1 daily by mouth at bedtime  Labs Reviewed: SGOT: 27 (07/17/2009)   SGPT: 42 (07/17/2009)  Prior 10 Yr Risk Heart Disease: 14 % (01/02/2009)   HDL:42.00 (07/17/2009), 45.3 (06/27/2008)  LDL:70 (07/17/2009), 97 (06/27/2008)  Chol:138 (07/17/2009), 170 (06/27/2008)  Trig:129.0 (07/17/2009), 140 (06/27/2008)  Problem # 5:  DEPRESSION (ICD-311) Assessment: Unchanged Well controlled. His updated medication list for this problem includes:    Sertraline Hcl 100 Mg Tabs (Sertraline hcl) .Marland Kitchen... 1/2 tablet by mouth daily  Problem # 6:  HYPERTENSION (ICD-401.9) Assessment: Unchanged  His updated medication list for this problem includes:    Cardura 8 Mg Tabs (Doxazosin mesylate) .Marland Kitchen... Take one by mouth daily    Quinapril Hcl 40 Mg Tabs (Quinapril hcl) .Marland Kitchen... 1 daily by mouth    Quinapril-hydrochlorothiazide 10-12.5 Mg Tabs (Quinapril-hydrochlorothiazide) .Marland Kitchen... 1 daily by mouth  BP today: 138/60 Prior BP: 126/60 (04/19/2009)  Prior 10 Yr Risk Heart Disease: 14 % (01/02/2009)  Labs Reviewed: K+: 4.6 (07/17/2009) Creat: : 0.9 (07/17/2009)   Chol: 138 (07/17/2009)   HDL: 42.00 (07/17/2009)   LDL: 70 (07/17/2009)   TG: 129.0 (07/17/2009)  Problem # 7:  DIABETES MELLITUS, TYPE II (ICD-250.00) Assessment: Unchanged Good control altho A1C slightly elevated. His updated medication list for this problem includes:    Quinapril Hcl 40 Mg Tabs (Quinapril hcl) .Marland Kitchen... 1 daily by mouth    Quinapril-hydrochlorothiazide 10-12.5 Mg Tabs (Quinapril-hydrochlorothiazide) .Marland Kitchen... 1 daily by mouth    Bayer Low Strength 81 Mg Tbec (Aspirin) .Marland Kitchen... 1 by mouth daily    Metformin Hcl 500 Mg Tabs  (Metformin hcl) ..... One tab by mouth twice a day.  Labs Reviewed: Creat: 0.9 (07/17/2009)   Microalbumin: 2.4 (04/28/2006)  Last Eye Exam: normal (02/10/2008) Reviewed HgBA1c results: 6.3 (07/17/2009)  5.8 (04/05/2009)  Complete Medication List: 1)  Cardura 8 Mg Tabs (Doxazosin mesylate) .... Take one by mouth daily 2)  Lipitor 10 Mg Tabs (Atorvastatin calcium) .Marland Kitchen.. 1 daily by mouth at bedtime 3)  Quinapril Hcl 40 Mg Tabs (Quinapril hcl) .Marland Kitchen.. 1 daily by mouth 4)  Quinapril-hydrochlorothiazide 10-12.5 Mg Tabs (Quinapril-hydrochlorothiazide) .Marland Kitchen.. 1 daily by mouth 5)  Bayer Low Strength 81 Mg Tbec (Aspirin) .Marland Kitchen.. 1 by mouth daily 6)  Tonic H2o  .... Daily as needed 7)  Hca Multivitamin/minerals Tabs (Multiple vitamins-minerals) .... Take one by mouth daily 8)  Metformin Hcl 500 Mg Tabs (Metformin hcl) .... One tab by mouth twice a day. 9)  Sertraline Hcl 100 Mg Tabs (Sertraline hcl) .... 1/2 tablet by mouth daily  Other Orders: TD Toxoids IM 7 YR + (16109) Admin 1st Vaccine (60454) Admin 1st Vaccine [facility]) [phone])  Patient Instructions: 1)  RTC as needed.  Current Allergies (reviewed today): No known allergies    Tetanus/Td Vaccine  Vaccine Type: Td    Site: left deltoid    Mfr: Sanofi Pasteur    Dose: 0.5 ml    Route: IM    Given by: Sydell Axon LPN    Exp. Date: 06/13/2011    Lot #: [id number]    VIS given: 06/16/07 version given August 28, 2009.

## 2010-08-30 NOTE — Progress Notes (Signed)
Summary: Clarification for Quinapril   Phone Note Refill Request Call back at [phone] Message from:  [employer] on August 14, 2009 11:15 AM  Refills Requested: Medication #1:  QUINAPRIL HCL 40 MG TABS 1 daily by mouth  Medication #2:  QUINAPRIL-HYDROCHLOROTHIAZIDE 10-12.5 MG TABS 1 daily by mouth Received faxed request for clarification.  Please advise.  Form in your IN box   Method Requested: Fax to Local Pharmacy Initial call taken by: Linde Gillis CMA Duncan Dull),  August 14, 2009 11:16 AM  Follow-up for Phone Call        I'm not quite sure if he is supposed to be taking both.  Can you please call patient to find out what he is taking. Thank you. Follow-up by: Ruthe Mannan MD,  August 14, 2009 11:25 AM  Additional Follow-up for Phone Call Additional follow up Details #1::        Spoke with patient and he is taking both.  One tablet daily. Additional Follow-up by: Linde Gillis CMA [facility] Dull),  August 14, 2009 12:54 PM    Additional Follow-up for Phone Call Additional follow up Details #2::    Ok, signed form.  It's in my box. Follow-up by: Ruthe Mannan MD,  August 14, 2009 1:09 PM   Appended Document: Clarification for Quinapril  Faxed to [facility]

## 2010-08-30 NOTE — Miscellaneous (Signed)
Summary: Flu vaccine  Clinical Lists Changes  Observations: Added new observation of FLU VAX: Historical (05/07/2010 10:31)      Influenza Immunization History:    Influenza # 1:  Historical (05/07/2010) Received form from [facility]

## 2010-08-31 NOTE — Letter (Signed)
Summary: [facility],Note  Diane Warden,ANP-C,[facility],Note   Imported By: Beau Fanny 11/08/2009 14:44:07  _____________________________________________________________________  External Attachment:    Type:   Image     Comment:   External Document  Appended Document: Horton Chin    Clinical Lists Changes  Observations: Added new observation of PAST SURG HX: Pneumonia as baby  Colonoscopy Polyp x 2 Removed Divertics Int/Ext Hemms (Dr Christella Hartigan) 12/22/08         repeat 2015 Circumcision  (Dr Patsi Sears)  10/30/09 (11/09/2009 7:02)       Past Surgical History:    Pneumonia as baby     Colonoscopy Polyp x 2 Removed Divertics Int/Ext Hemms (Dr Christella Hartigan) 12/22/08         repeat 2015    Circumcision  (Dr Patsi Sears)  10/30/09

## 2010-08-31 NOTE — Letter (Signed)
Summary: Dr.Sigmund Tannenbaum,[facility],Note  Dr.Sigmund Tannenbaum,[facility],Note   Imported By: Beau Fanny 10/24/2009 13:51:04  _____________________________________________________________________  External Attachment:    Type:   Image     Comment:   External Document  Appended Document: Dr.Sigmund Tannenbaum,[facility],Note    Clinical Lists Changes  Problems: Added new problem of PHIMOSIS (DR TANNENBAUM) (ICD-605)

## 2010-09-03 ENCOUNTER — Encounter (INDEPENDENT_AMBULATORY_CARE_PROVIDER_SITE_OTHER): Payer: Self-pay | Admitting: *Deleted

## 2010-09-03 ENCOUNTER — Other Ambulatory Visit (INDEPENDENT_AMBULATORY_CARE_PROVIDER_SITE_OTHER): Payer: 59

## 2010-09-03 ENCOUNTER — Other Ambulatory Visit: Payer: Self-pay | Admitting: Family Medicine

## 2010-09-03 DIAGNOSIS — I10 Essential (primary) hypertension: Secondary | ICD-10-CM

## 2010-09-03 DIAGNOSIS — K5730 Diverticulosis of large intestine without perforation or abscess without bleeding: Secondary | ICD-10-CM

## 2010-09-03 DIAGNOSIS — E785 Hyperlipidemia, unspecified: Secondary | ICD-10-CM

## 2010-09-03 DIAGNOSIS — E119 Type 2 diabetes mellitus without complications: Secondary | ICD-10-CM

## 2010-09-03 DIAGNOSIS — N400 Enlarged prostate without lower urinary tract symptoms: Secondary | ICD-10-CM

## 2010-09-03 LAB — CBC WITH DIFFERENTIAL/PLATELET
Basophils Relative: 0.4 % (ref 0.0–3.0)
HCT: 40.8 % (ref 39.0–52.0)
Hemoglobin: 13.9 g/dL (ref 13.0–17.0)
Lymphocytes Relative: 20.8 % (ref 12.0–46.0)
MCHC: 33.9 g/dL (ref 30.0–36.0)
Monocytes Relative: 14.7 % — ABNORMAL HIGH (ref 3.0–12.0)
Neutro Abs: 3.4 10*3/uL (ref 1.4–7.7)
RBC: 4.41 Mil/uL (ref 4.22–5.81)

## 2010-09-03 LAB — BASIC METABOLIC PANEL
CO2: 31 mEq/L (ref 19–32)
Calcium: 9.2 mg/dL (ref 8.4–10.5)
GFR: 105.19 mL/min (ref >60.00)
Potassium: 5.1 mEq/L (ref 3.5–5.1)
Sodium: 139 mEq/L (ref 135–145)

## 2010-09-03 LAB — MICROALBUMIN / CREATININE URINE RATIO
Creatinine,U: 249.3 mg/dL
Microalb Creat Ratio: 0.8 mg/g (ref 0.0–30.0)
Microalb, Ur: 2 mg/dL — ABNORMAL HIGH (ref 0.0–1.9)

## 2010-09-03 LAB — LIPID PANEL
HDL: 41 mg/dL (ref >39.00)
Total CHOL/HDL Ratio: 3

## 2010-09-03 LAB — HEPATIC FUNCTION PANEL
ALT: 33 U/L (ref 0–53)
AST: 22 U/L (ref 0–37)
Alkaline Phosphatase: 68 U/L (ref 39–117)

## 2010-09-03 LAB — HEMOGLOBIN A1C: Hgb A1c MFr Bld: 7.5 % — ABNORMAL HIGH (ref 4.6–6.5)

## 2010-09-03 LAB — PSA: PSA: 1.32 ng/mL (ref 0.10–4.00)

## 2010-09-06 ENCOUNTER — Encounter (INDEPENDENT_AMBULATORY_CARE_PROVIDER_SITE_OTHER): Payer: Medicare Other | Admitting: Family Medicine

## 2010-09-06 ENCOUNTER — Encounter: Payer: Self-pay | Admitting: Family Medicine

## 2010-09-06 DIAGNOSIS — Z0100 Encounter for examination of eyes and vision without abnormal findings: Secondary | ICD-10-CM

## 2010-09-06 DIAGNOSIS — N400 Enlarged prostate without lower urinary tract symptoms: Secondary | ICD-10-CM

## 2010-09-06 DIAGNOSIS — I10 Essential (primary) hypertension: Secondary | ICD-10-CM

## 2010-09-06 DIAGNOSIS — E785 Hyperlipidemia, unspecified: Secondary | ICD-10-CM

## 2010-09-06 DIAGNOSIS — E119 Type 2 diabetes mellitus without complications: Secondary | ICD-10-CM

## 2010-09-06 DIAGNOSIS — Z0110 Encounter for examination of ears and hearing without abnormal findings: Secondary | ICD-10-CM

## 2010-09-06 DIAGNOSIS — Z0000 Encounter for general adult medical examination without abnormal findings: Secondary | ICD-10-CM

## 2010-09-06 LAB — HM DIABETES FOOT EXAM

## 2010-09-12 ENCOUNTER — Encounter: Payer: Self-pay | Admitting: Family Medicine

## 2010-09-12 ENCOUNTER — Ambulatory Visit (INDEPENDENT_AMBULATORY_CARE_PROVIDER_SITE_OTHER): Payer: Medicare Other | Admitting: Family Medicine

## 2010-09-12 DIAGNOSIS — L570 Actinic keratosis: Secondary | ICD-10-CM

## 2010-09-13 NOTE — Letter (Signed)
Summary: Nature conservation officer [employer] Wellness Visit Questionnaire   [employer] Annual Wellness Visit Questionnaire   Imported By: Beau Fanny 09/06/2010 16:55:12  _____________________________________________________________________  External Attachment:    Type:   Image     Comment:   External Document

## 2010-09-13 NOTE — Assessment & Plan Note (Signed)
Summary: CPX/RBH   Vital Signs:  Patient profile:   76 year old male Weight:      156 pounds Temp:     97.9 degrees F oral Pulse rate:   72 / minute Pulse rhythm:   regular BP sitting:   130 / 68  (left arm) Cuff size:   regular  Vitals Entered By: Sydell Axon LPN (September 06, 2010 10:39 AM) CC: 30 Minute checkup, had a colonoscopy 5/10  Vision Screening:Left eye with correction: 20 / 20 Right eye with correction: 20 / 30 Both eyes with correction: 20 / 20       25db HL: Left  500 hz: 25db 1000 hz: 25db 2000 hz: 25db 4000 hz: No Response Right  500 hz: 25db 1000 hz: 25db 2000 hz: 25db 4000 hz: No Response    History of Present Illness: Pt here for Comp Exam. Is now Medicare insured and doing fine. He had congestion when seen in the Fall. He has mild LLQ stomach pain, every evening after eating...he has diverticulitis. He had stopped his Citrucel for a while and now has gotten back on it and sxs have resolved. He was given Tansulosin...is still having nightly sxs. He has strarted drinking wine regularly.  Preventive Screening-Counseling & Management  Alcohol-Tobacco     Alcohol drinks/day: 1, sometimes more.     Alcohol type: Rare wine     Smoking Status: quit     Year Quit: 1994     Pack years: 62     Passive Smoke Exposure: no  Caffeine-Diet-Exercise     Caffeine use/day: 2     Does Patient Exercise: yes     Type of exercise: walks     Times/week: 5  Problems Prior to Update: 1)  Uri  (ICD-465.9) 2)  Diverticulosis of Colon  (ICD-562.10) 3)  Hemorrhoids, Int and Ext  (ICD-455.6) 4)  Colonic Polyps  (ICD-211.3) 5)  Blood in Stool  (ICD-578.1) 6)  Screening For Malignannt Neoplasm, Site Nec  (ICD-V76.49) 7)  Benign Prostatic Hypertrophy  (ICD-600.00) 8)  Hyperlipidemia  (ICD-272.4) 9)  Depression  (ICD-311) 10)  COPD  (ICD-496) 11)  Hypertension  (ICD-401.9) 12)  Nicotine Addiction Quit 1994 80pyh  (ICD-305.1) 13)  Abuse, Alcohol, 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 1 Daily By Mouth At Bedtime 2)  Quinapril Hcl 40 Mg Tabs (Quinapril Hcl) .Marland Kitchen.. 1 Daily By Mouth 3)  Quinapril-Hydrochlorothiazide 10-12.5 Mg Tabs (Quinapril-Hydrochlorothiazide) .Marland Kitchen.. 1 Daily By Mouth 4)  Bayer Low Strength 81 Mg  Tbec (Aspirin) .Marland Kitchen.. 1 By Mouth Daily 5)  Tonic H2o .... Daily As Needed 6)  Hca Multivitamin/minerals   Tabs (Multiple Vitamins-Minerals) .... Take One By Mouth Daily 7)  Metformin Hcl 500 Mg Tabs (Metformin Hcl) .... One Tab By Mouth Twice A Day. 8)  Sertraline Hcl 100 Mg Tabs (Sertraline Hcl) .... 1/2 Tablet By Mouth Daily 9)  Tamsulosin Hcl 0.4 Mg Caps (Tamsulosin Hcl) .... Take One By Mouth At Bedtime  Current Medications (verified): 1)  Lipitor 10 Mg Tabs (Atorvastatin Calcium) .Marland Kitchen.. 1 Daily By Mouth At Bedtime 2)  Quinapril Hcl 40 Mg Tabs (Quinapril Hcl) .Marland Kitchen.. 1 Daily By Mouth 3)  Quinapril-Hydrochlorothiazide 10-12.5 Mg Tabs (Quinapril-Hydrochlorothiazide) .Marland Kitchen.. 1 Daily By Mouth 4)  Bayer Low Strength 81 Mg  Tbec (Aspirin) .Marland Kitchen.. 1 By Mouth Daily 5)  Tonic H2o .Marland KitchenMarland KitchenMarland Kitchen  Daily As Needed 6)  Hca Multivitamin/minerals   Tabs (Multiple Vitamins-Minerals) .... Take One By Mouth Daily 7)  Metformin Hcl 500 Mg Tabs (Metformin Hcl) .... One Tab By Mouth Twice A Day. 8)  Sertraline Hcl 100 Mg Tabs (Sertraline Hcl) .... 1/2 Tablet By Mouth Daily 9)  Tamsulosin Hcl 0.4 Mg Caps (Tamsulosin Hcl) .... Take One By Mouth At Bedtime  Allergies: No Known Drug Allergies  Past History:  Past Medical History: Last updated: 12/20/2008 Diabetes mellitus, type II (10/2000) Hypertension (1996) COPD Depression Hyperlipidemia Benign prostatic hypertrophy   Past Surgical History: Last updated: 11/09/2009 Pneumonia as baby  Colonoscopy Polyp x 2 Removed Divertics Int/Ext Hemms (Dr Christella Hartigan) 12/22/08         repeat  2015 Circumcision  (Dr Patsi Sears)  10/30/09  Family History: Last updated: 08/28/2009 Father dec 84 Prostate Ca Aortic Aneurysm Stroke Mother dec 30s Tubal Childbirth Brother dec 48  Hodgkin's at [facility] 1/2  A 50s Prostate Ca  Brother 1/2  A 50s Prostate Ca Brother A  65 DM ([place]) Sister dec Cerebral Hemm Sister A Lupus no colon cancer  Social History: Last updated: 12/20/2008 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 (09/06/2010) Caffeine Use: 2 (09/06/2010) Exercise: yes (09/06/2010)  Risk Factors: Smoking Status: quit (09/06/2010) Passive Smoke Exposure: no (09/06/2010)  Review of Systems General:  Denies chills, fatigue, fever, sweats, weakness, and weight loss. Eyes:  Denies blurring, discharge, and eye pain. ENT:  Complains of ringing in ears; denies decreased hearing, ear discharge, and earache. CV:  Denies chest pain or discomfort, fainting, fatigue, palpitations, shortness of breath with exertion, swelling of feet, and swelling of hands. Resp:  Denies cough, shortness of breath, and wheezing. GI:  Denies abdominal pain, bloody stools, change in bowel habits, constipation, dark tarry stools, diarrhea, indigestion, loss of appetite, nausea, vomiting, vomiting blood, and yellowish skin color. GU:  Complains of nocturia and urinary frequency; denies discharge and dysuria. MS:  Denies joint pain, low back pain, muscle aches, cramps, and stiffness. Derm:  Denies dryness, itching, and rash; has AKs, will muse LN2 next time.. Neuro:  Denies numbness, poor balance, tingling, and tremors.  Physical Exam  General:  Well-developed,well-nourished,in no acute distress; alert,appropriate and cooperative throughout examination, clear. Head:  Normocephalic and  atraumatic without obvious abnormalities. No apparent alopecia or balding, has mild thinning of hair. Sinuses NT. Eyes:  Conjunctiva clear bilaterally.  Ears:  External ear exam shows no significant lesions or deformities.  Otoscopic examination reveals clear canals, tympanic membranes are intact bilaterally without bulging, retraction, inflammation or discharge. Hearing is grossly normal bilaterally. Nose:  External nasal examination shows no deformity or inflammation. Nasal mucosa are pink and moist without lesions.  Mouth:  Oral mucosa and oropharynx without lesions or exudates.  Teeth in good repair. Neck:  No deformities, masses, or tenderness noted. Chest Wall:  No deformities, masses, tenderness or gynecomastia noted. Breasts:  No masses or gynecomastia noted Lungs:  Normal respiratory effort, chest expands symmetrically. Lungs are clear to auscultation, no crackles or wheezes. Heart:  Normal rate and regular rhythm. S1 and S2 normal without gallop, murmur, click, rub or other extra sounds. Abdomen:  Bowel sounds positive,abdomen soft and non-tender without masses, organomegaly or hernias noted. Mildly protuberant. Rectal:  No external abnormalities noted. Normal sphincter  tone. No rectal masses or tenderness. Incr sphincter tone. Slight scarring 12 oclock pos. Gneg. Genitalia:  Testes bilaterally descended without nodularity, tenderness or masses. No scrotal masses or lesions. No penis lesions or urethral discharge. Prostate:  Prostate gland firm and smooth, no enlargement, nodularity, tenderness, mass, asymmetry or induration. 20 gms. Msk:  No deformity or scoliosis noted of thoracic or lumbar spine.   Pulses:  R and L carotid,radial,femoral,dorsalis pedis and posterior tibial pulses are full and equal bilaterally Extremities:  No clubbing, cyanosis, edema, or deformity noted with normal full range of motion of all joints.   Neurologic:  No cranial nerve deficits noted. Station and gait are  normal. Sensory, motor and coordinative functions appear intact. Skin:  Multiple Aks thick and irritated on the dorsum of the hands and forehead and scalp, some near prominent veins. No worrisome lesions seen globally. Cervical Nodes:  No lymphadenopathy noted Inguinal Nodes:  No significant adenopathy Psych:  Cognition and judgment appear intact. Alert and cooperative with normal attention span and concentration. No apparent delusions, illusions, hallucinations  Diabetes Management Exam:    Foot Exam (with socks and/or shoes not present):       Sensory-Pinprick/Light touch:          Left medial foot (L-4): normal          Left dorsal foot (L-5): normal          Left lateral foot (S-1): normal          Right medial foot (L-4): normal          Right dorsal foot (L-5): normal          Right lateral foot (S-1): normal       Sensory-Monofilament:          Left foot: normal          Right foot: normal       Inspection:          Left foot: normal          Right foot: normal       Nails:          Left foot: thickened          Right foot: normal    Eye Exam:       Eye Exam done elsewhere          Date: 02/07/2010          Results: normal          Done by: [facility]   Impression & Recommendations:  Problem # 1:  PREVENTIVE HEALTH CARE (ICD-V70.0) Assessment Comment Only  I have personally reviewed the Medicare Annual Wellness questionnaire and have noted 1.   The patient's medical and social history 2.   Their use of alcohol, tobacco or illicit drugs 3.   Their current medications and supplements 4.   The patient's functional ability including ADL's, fall risks, home safety risks and hearing or visual             impairment. 5.   Diet and physical activities 6.   Evidence for depression or mood disorders  Orders: [facility] -Subsequent Annual Wellness Visit [phone])  Problem # 2:  DIVERTICULOSIS OF COLON (ICD-562.10) Discussed being seen for prolonged LLQ discomfort. Colonoscopy:  5/10 2 polyps removed. divertics.  Labs Reviewed: Hgb: 13.9 (09/03/2010)   Hct: 40.8 (09/03/2010)   WBC: 5.4 (09/03/2010)  Problem # 3:  HEMORRHOIDS, INT AND EXT (ICD-455.6) Assessment: Unchanged stable, no recent  problems, uses Citrucel regularly.  Problem # 4:  BENIGN PROSTATIC HYPERTROPHY (ICD-600.00) Assessment: Deteriorated Sxs deteriorating on Tamsulosin. Will add Finasteride and try stopping Tamsulosin in 6 mos. Script written. Orders: Prescription Created Electronically [phone])  Problem # 5:  HYPERLIPIDEMIA (ICD-272.4) Assessment: Unchanged Good nos. Cont Lipitor. His updated medication list for this problem includes:    Lipitor 10 Mg Tabs (Atorvastatin calcium) .Marland Kitchen... 1 daily by mouth at bedtime  Orders: Prescription Created Electronically [phone])  Labs Reviewed: SGOT: 22 (09/03/2010)   SGPT: 33 (09/03/2010)  Prior 10 Yr Risk Heart Disease: 14 % (01/02/2009)   HDL:41.00 (09/03/2010), 42.00 (07/17/2009)  LDL:75 (09/03/2010), 70 (07/17/2009)  Chol:142 (09/03/2010), 138 (07/17/2009)  Trig:131.0 (09/03/2010), 129.0 (07/17/2009)  Problem # 6:  DEPRESSION (ICD-311) Assessment: Unchanged He does not think the increased wine drinking is from depression. He feels he can stop the wine on his own. His updated medication list for this problem includes:    Sertraline Hcl 100 Mg Tabs (Sertraline hcl) .Marland Kitchen... 1/2 tablet by mouth daily  Orders: Prescription Created Electronically [phone])  Problem # 7:  HYPERTENSION (ICD-401.9) Assessment: Unchanged Stable. Cont meds. His updated medication list for this problem includes:    Quinapril Hcl 40 Mg Tabs (Quinapril hcl) .Marland Kitchen... 1 daily by mouth    Quinapril-hydrochlorothiazide 10-12.5 Mg Tabs (Quinapril-hydrochlorothiazide) .Marland Kitchen... 1 daily by mouth  Orders: Prescription Created Electronically [phone])  BP today: 130/68 Prior BP: 124/68 (06/06/2010)  Prior 10 Yr Risk Heart Disease: 14 % (01/02/2009)  Labs Reviewed: K+: 5.1  (09/03/2010) Creat: : 0.8 (09/03/2010)   Chol: 142 (09/03/2010)   HDL: 41.00 (09/03/2010)   LDL: 75 (09/03/2010)   TG: 131.0 (09/03/2010)  Problem # 8:  DIABETES MELLITUS, TYPE II (ICD-250.00) Assessment: Deteriorated Has worsened slightly. Increase Metformin to two at night, one in the AM. His updated medication list for this problem includes:    Quinapril Hcl 40 Mg Tabs (Quinapril hcl) .Marland Kitchen... 1 daily by mouth    Quinapril-hydrochlorothiazide 10-12.5 Mg Tabs (Quinapril-hydrochlorothiazide) .Marland Kitchen... 1 daily by mouth    Bayer Low Strength 81 Mg Tbec (Aspirin) .Marland Kitchen... 1 by mouth daily    Metformin Hcl 500 Mg Tabs (Metformin hcl) ..... One tab by mouth in am, two at night..  Orders: Prescription Created Electronically [phone])  Labs Reviewed: Creat: 0.8 (09/03/2010)   Microalbumin: 2.4 (04/28/2006)  Last Eye Exam: normal (02/07/2010) Reviewed HgBA1c results: 7.5 (09/03/2010)  7.0 (05/29/2010)  Problem # 9:  ABUSE, ALCOHOL, IN REMISSION (QUIT 06/1999) H/O INCR LFTS (ICD-305.03) Assessment: Unchanged Does not seem to be recurrence but pt must guard against this and be careful. Discussed.  Complete Medication List: 1)  Lipitor 10 Mg Tabs (Atorvastatin calcium) .Marland Kitchen.. 1 daily by mouth at bedtime 2)  Quinapril Hcl 40 Mg Tabs (Quinapril hcl) .Marland Kitchen.. 1 daily by mouth 3)  Quinapril-hydrochlorothiazide 10-12.5 Mg Tabs (Quinapril-hydrochlorothiazide) .Marland Kitchen.. 1 daily by mouth 4)  Bayer Low Strength 81 Mg Tbec (Aspirin) .Marland Kitchen.. 1 by mouth daily 5)  Tonic H2o  .... Daily as needed 6)  Hca Multivitamin/minerals Tabs (Multiple vitamins-minerals) .... Take one by mouth daily 7)  Metformin Hcl 500 Mg Tabs (Metformin hcl) .... One tab by mouth in am, two at night.Marland Kitchen 8)  Sertraline Hcl 100 Mg Tabs (Sertraline hcl) .... 1/2 tablet by mouth daily 9)  Tamsulosin Hcl 0.4 Mg Caps (Tamsulosin hcl) .... Take one by mouth at bedtime 10)  Finasteride 5 Mg Tabs (Finasteride) .... One tab by mouth daily  Other Orders: Audiometry  [phone]) Vision Screening (37628)  Patient Instructions:  1)  RTC for LN2 tx...15 mins.  2)  Start Finasteride. Increase Metformin. Prescriptions: FINASTERIDE 5 MG TABS (FINASTERIDE) one tab by mouth daily  #90 x 12   Entered and Authorized by:   Shaune Leeks MD   Signed by:   Shaune Leeks MD on 09/06/2010   Method used:   Faxed to ...       [facility] (mail-order)             , [place]         Ph: [phone]       Fax: [phone]   RxID:   [id number] METFORMIN HCL 500 MG TABS (METFORMIN HCL) one tab by mouth in AM, two at night..  #270 x 3   Entered and Authorized by:   Shaune Leeks MD   Signed by:   Shaune Leeks MD on 09/06/2010   Method used:   Faxed to ...       [facility] (mail-order)             , [place]         Ph: [phone]       Fax: [phone]   RxID:   [id number] TAMSULOSIN HCL 0.4 MG CAPS (TAMSULOSIN HCL) Take one by mouth at bedtime  #90 x 3   Entered and Authorized by:   Shaune Leeks MD   Signed by:   Shaune Leeks MD on 09/06/2010   Method used:   Faxed to ...       [facility] (mail-order)             , [place]         Ph: [phone]       Fax: [phone]   RxID:   [id number] SERTRALINE HCL 100 MG TABS (SERTRALINE HCL) 1/2 tablet by mouth daily  #45 x 3   Entered and Authorized by:   Shaune Leeks MD   Signed by:   Shaune Leeks MD on 09/06/2010   Method used:   Faxed to ...       [facility] (mail-order)             , [place]         Ph: [phone]       Fax: [phone]   RxID:   [id number] QUINAPRIL-HYDROCHLOROTHIAZIDE 10-12.5 MG TABS (QUINAPRIL-HYDROCHLOROTHIAZIDE) 1 daily by mouth  #90 x 3   Entered and Authorized by:   Shaune Leeks MD   Signed by:   Shaune Leeks MD on 09/06/2010   Method used:   Faxed to ...       [facility] (mail-order)             , [place]         Ph: [phone]       Fax: [phone]   RxID:   [id number] QUINAPRIL HCL 40 MG TABS (QUINAPRIL HCL) 1  daily by mouth  #90 x 3   Entered and Authorized by:   Shaune Leeks MD   Signed by:   Shaune Leeks MD on 09/06/2010   Method used:   Faxed to ...       [facility] (mail-order)             , [place]         Ph: [phone]       Fax: [phone]   RxID:   [id number] LIPITOR 10 MG TABS (ATORVASTATIN CALCIUM) 1 daily by  mouth at bedtime  #90 x 3   Entered and Authorized by:   Shaune Leeks MD   Signed by:   Shaune Leeks MD on 09/06/2010   Method used:   Faxed to ...       [facility] (mail-order)             , [place]         Ph: [phone]       Fax: [phone]   RxID:   [id number]    Orders Added: 1)  Audiometry [92552] 2)  Vision Screening [10272] 3)  [facility] -Subsequent Annual Wellness Visit [G0439] 4)  Prescription Created Electronically [phone]

## 2010-09-19 NOTE — Assessment & Plan Note (Signed)
Summary: rtc for ln2 15 mins/rbh   Vital Signs:  Patient profile:   76 year old male Weight:      153 pounds Temp:     97.3 degrees F oral Pulse rate:   76 / minute Pulse rhythm:   regular BP sitting:   130 / 70  (left arm) Cuff size:   regular  Vitals Entered By: Sydell Axon LPN (September 12, 2010 11:54 AM) CC: Follow-up to remove spots on hand   History of Present Illness: Pt here for trmt of AKs of hands and forehead. He has been treated multiple times by Derm with chemical peels but would prefer LN2 trmt here.  HE has no other complaints and feels well.  Problems Prior to Update: 1)  Preventive Health Care  (ICD-V70.0) 2)  Diverticulosis of Colon  (ICD-562.10) 3)  Hemorrhoids, Int and Ext  (ICD-455.6) 4)  Colonic Polyps  (ICD-211.3) 5)  Blood in Stool  (ICD-578.1) 6)  Screening For Malignannt Neoplasm, Site Nec  (ICD-V76.49) 7)  Benign Prostatic Hypertrophy  (ICD-600.00) 8)  Hyperlipidemia  (ICD-272.4) 9)  Depression  (ICD-311) 10)  COPD  (ICD-496) 11)  Hypertension  (ICD-401.9) 12)  Nicotine Addiction Quit 1994 80pyh  (ICD-305.1) 13)  Abuse, Alcohol, in Remission (QUIT 06/1999) H/o Incr Lfts  (ICD-305.03) 14)  Diabetes Mellitus, Type II  (ICD-250.00)  Medications Prior to Update: 1)  Lipitor 10 Mg Tabs (Atorvastatin Calcium) .Marland Kitchen.. 1 Daily By Mouth At Bedtime 2)  Quinapril Hcl 40 Mg Tabs (Quinapril Hcl) .Marland Kitchen.. 1 Daily By Mouth 3)  Quinapril-Hydrochlorothiazide 10-12.5 Mg Tabs (Quinapril-Hydrochlorothiazide) .Marland Kitchen.. 1 Daily By Mouth 4)  Bayer Low Strength 81 Mg  Tbec (Aspirin) .Marland Kitchen.. 1 By Mouth Daily 5)  Tonic H2o .... Daily As Needed 6)  Hca Multivitamin/minerals   Tabs (Multiple Vitamins-Minerals) .... Take One By Mouth Daily 7)  Metformin Hcl 500 Mg Tabs (Metformin Hcl) .... One Tab By Mouth in Am, Two At Night.Marland Kitchen 8)  Sertraline Hcl 100 Mg Tabs (Sertraline Hcl) .... 1/2 Tablet By Mouth Daily 9)  Tamsulosin Hcl 0.4 Mg Caps (Tamsulosin Hcl) .... Take One By Mouth At  Bedtime 10)  Finasteride 5 Mg Tabs (Finasteride) .... One Tab By Mouth Daily  Current Medications (verified): 1)  Lipitor 10 Mg Tabs (Atorvastatin Calcium) .Marland Kitchen.. 1 Daily By Mouth At Bedtime 2)  Quinapril Hcl 40 Mg Tabs (Quinapril Hcl) .Marland Kitchen.. 1 Daily By Mouth 3)  Quinapril-Hydrochlorothiazide 10-12.5 Mg Tabs (Quinapril-Hydrochlorothiazide) .Marland Kitchen.. 1 Daily By Mouth 4)  Bayer Low Strength 81 Mg  Tbec (Aspirin) .Marland Kitchen.. 1 By Mouth Daily 5)  Tonic H2o .... Daily As Needed 6)  Hca Multivitamin/minerals   Tabs (Multiple Vitamins-Minerals) .... Take One By Mouth Daily 7)  Metformin Hcl 500 Mg Tabs (Metformin Hcl) .... One Tab By Mouth in Am, Two At Night.Marland Kitchen 8)  Sertraline Hcl 100 Mg Tabs (Sertraline Hcl) .... 1/2 Tablet By Mouth Daily 9)  Tamsulosin Hcl 0.4 Mg Caps (Tamsulosin Hcl) .... Take One By Mouth At Bedtime 10)  Finasteride 5 Mg Tabs (Finasteride) .... One Tab By Mouth Daily  Allergies: No Known Drug Allergies  Physical Exam  General:  Well-developed,well-nourished,in no acute distress; alert,appropriate and cooperative throughout examination, clear. Skin:  Multiple AKs on his hands dorsally and on the scalp, principally the frontal area but also a few on the more posterior area....Marland Kitchenalll treated with LN2 per protocol. Pt tolerated procedure well.   Impression & Recommendations:  Problem # 1:  ACTINIC KERATOSIS, HEAD AND HANDS (ICD-702.0) Assessment New  Old problem treated with LN2 here for the first time. Tolerated well. Keep clean and dry.   Orders: Cryotherapy/Destruction benign or premalignant lesion (1st lesion)  (17000) Cryotherapy/Destruction benign or premalignant lesion (2nd-14th lesions) (17003)  Complete Medication List: 1)  Lipitor 10 Mg Tabs (Atorvastatin calcium) .Marland Kitchen.. 1 daily by mouth at bedtime 2)  Quinapril Hcl 40 Mg Tabs (Quinapril hcl) .Marland Kitchen.. 1 daily by mouth 3)  Quinapril-hydrochlorothiazide 10-12.5 Mg Tabs (Quinapril-hydrochlorothiazide) .Marland Kitchen.. 1 daily by mouth 4)  Bayer  Low Strength 81 Mg Tbec (Aspirin) .Marland Kitchen.. 1 by mouth daily 5)  Tonic H2o  .... Daily as needed 6)  Hca Multivitamin/minerals Tabs (Multiple vitamins-minerals) .... Take one by mouth daily 7)  Metformin Hcl 500 Mg Tabs (Metformin hcl) .... One tab by mouth in am, two at night.Marland Kitchen 8)  Sertraline Hcl 100 Mg Tabs (Sertraline hcl) .... 1/2 tablet by mouth daily 9)  Tamsulosin Hcl 0.4 Mg Caps (Tamsulosin hcl) .... Take one by mouth at bedtime 10)  Finasteride 5 Mg Tabs (Finasteride) .... One tab by mouth daily  Patient Instructions: 1)  Keep areas clean and dry.  2)  RTC as needed or if becomes irritated.   Orders Added: 1)  Cryotherapy/Destruction benign or premalignant lesion (1st lesion)  [17000] 2)  Cryotherapy/Destruction benign or premalignant lesion (2nd-14th lesions) [17003]    Current Allergies (reviewed today): No known allergies

## 2010-10-17 LAB — POCT I-STAT, CHEM 8
Calcium, Ion: 1.19 mmol/L (ref 1.12–1.32)
HCT: 45 % (ref 39.0–52.0)
Hemoglobin: 15.3 g/dL (ref 13.0–17.0)
TCO2: 28 mmol/L (ref 0–100)

## 2010-10-17 LAB — GLUCOSE, CAPILLARY: Glucose-Capillary: 119 mg/dL — ABNORMAL HIGH (ref 70–99)

## 2011-03-12 ENCOUNTER — Telehealth: Payer: Self-pay | Admitting: *Deleted

## 2011-03-12 NOTE — Telephone Encounter (Signed)
Pt has appt to see you on 8/22 and he is asking for labs prior.  OK to schedule?

## 2011-03-13 ENCOUNTER — Other Ambulatory Visit: Payer: Self-pay | Admitting: Family Medicine

## 2011-03-13 DIAGNOSIS — E119 Type 2 diabetes mellitus without complications: Secondary | ICD-10-CM

## 2011-03-13 NOTE — Telephone Encounter (Signed)
Patient notified as instructed by telephone. Lab appointment scheduled. 

## 2011-03-13 NOTE — Telephone Encounter (Signed)
Left message for patient to call back  

## 2011-03-13 NOTE — Telephone Encounter (Signed)
Have ordered A1C and Renal panel. Pls schedule appt time.

## 2011-03-14 ENCOUNTER — Encounter: Payer: Self-pay | Admitting: Family Medicine

## 2011-03-15 ENCOUNTER — Other Ambulatory Visit (INDEPENDENT_AMBULATORY_CARE_PROVIDER_SITE_OTHER): Payer: 59 | Admitting: Family Medicine

## 2011-03-15 DIAGNOSIS — E119 Type 2 diabetes mellitus without complications: Secondary | ICD-10-CM

## 2011-03-15 LAB — RENAL FUNCTION PANEL
Albumin: 4.6 g/dL (ref 3.5–5.2)
BUN: 18 mg/dL (ref 6–23)
Creatinine, Ser: 0.8 mg/dL (ref 0.4–1.5)
GFR: 105.03 mL/min (ref >60.00)
Glucose, Bld: 119 mg/dL — ABNORMAL HIGH (ref 70–99)
Phosphorus: 2.1 mg/dL — ABNORMAL LOW (ref 2.3–4.6)
Potassium: 4.5 mEq/L (ref 3.5–5.1)

## 2011-03-15 LAB — HEMOGLOBIN A1C: Hgb A1c MFr Bld: 6.2 % (ref 4.6–6.5)

## 2011-03-20 ENCOUNTER — Encounter: Payer: Self-pay | Admitting: Family Medicine

## 2011-03-20 ENCOUNTER — Ambulatory Visit (INDEPENDENT_AMBULATORY_CARE_PROVIDER_SITE_OTHER): Payer: Medicare Other | Admitting: Family Medicine

## 2011-03-20 DIAGNOSIS — IMO0002 Reserved for concepts with insufficient information to code with codable children: Secondary | ICD-10-CM

## 2011-03-20 DIAGNOSIS — R451 Restlessness and agitation: Secondary | ICD-10-CM | POA: Insufficient documentation

## 2011-03-20 DIAGNOSIS — F1011 Alcohol abuse, in remission: Secondary | ICD-10-CM

## 2011-03-20 DIAGNOSIS — N400 Enlarged prostate without lower urinary tract symptoms: Secondary | ICD-10-CM

## 2011-03-20 DIAGNOSIS — J449 Chronic obstructive pulmonary disease, unspecified: Secondary | ICD-10-CM

## 2011-03-20 DIAGNOSIS — E119 Type 2 diabetes mellitus without complications: Secondary | ICD-10-CM

## 2011-03-20 NOTE — Patient Instructions (Addendum)
RTC approx 45 days.  Have pharmacy call for script for Zostavax. Will transfer to Dr Reece Agar after next visit.

## 2011-03-20 NOTE — Assessment & Plan Note (Signed)
Great response. Cont increased Metformin and compliant diet and exercise. Lab Results  Component Value Date   HGBA1C 6.2 03/15/2011

## 2011-03-20 NOTE — Progress Notes (Signed)
  Subjective:    Patient ID: Justin Benson, male    DOB: 11-07-1934, 76 y.o.   MRN: [id number]  HPI Pt here for 6 month followup of DM, BPH and incr wine intake in the face of ETOH abuse in the past. He has done really well wioth his sugar control. We increased his Metformin to twice a day but he has really helped himself with continued diet and exercise. We also started Proscar last time with his past Flomax and he has recently stopped the Flomax with continued significantly impoved urination. He is satisfied with the way things are there.  He has been off and on with the wine. He had gotten to where he was drinking a liter of wine a day and then went for three months without anything. He has gone up and down since late May and hasn't had any for the last ten days or so. He thinks he drinks to alleviate thoughts and stresss of the mind. He has been on Sertraline 50 for a while now and is willing to increase to 100. I do not want to use a Benzo with his ETOH problems of the past and poss present. He would like a zostavax.    Review of SystemsNoncontributory except as above.       Objective:   Physical Exam  Constitutional: He appears well-developed and well-nourished. No distress.  HENT:  Head: Normocephalic and atraumatic.  Right Ear: External ear normal.  Left Ear: External ear normal.  Nose: Nose normal.  Mouth/Throat: Oropharynx is clear and moist.  Eyes: Conjunctivae and EOM are normal. Pupils are equal, round, and reactive to light. Right eye exhibits no discharge. Left eye exhibits no discharge.  Neck: Normal range of motion. Neck supple.  Cardiovascular: Normal rate and regular rhythm.   Pulmonary/Chest: Effort normal and breath sounds normal. He has no wheezes.  Lymphadenopathy:    He has no cervical adenopathy.  Skin: He is not diaphoretic.  Psychiatric: He has a normal mood and affect. His behavior is normal. Judgment and thought content normal.       If depressed, is doing  well with it. Could be mildly agitated.          Assessment & Plan:

## 2011-03-20 NOTE — Assessment & Plan Note (Signed)
Use care to avoid wine as much as able. Will treat for anxiousness, which is hopefully why he is intaking wine.

## 2011-03-20 NOTE — Assessment & Plan Note (Signed)
Nocturia under better control. Frequency and need to go back immediately much improved. Cont Proscar. May stay off Flomax.

## 2011-03-20 NOTE — Assessment & Plan Note (Signed)
Assume this to be more anxiety than further depression altho would treat the same way. Increase Zoloft to 100mg  a day. See me back when about to run out.

## 2011-04-02 ENCOUNTER — Telehealth: Payer: Self-pay | Admitting: *Deleted

## 2011-04-02 MED ORDER — ZOSTER VACCINE LIVE 19400 UNT/0.65ML ~~LOC~~ SOLR: 0.6500 mL | Freq: Once | SUBCUTANEOUS | Status: DC

## 2011-04-02 NOTE — Telephone Encounter (Signed)
Pt is asking that an order for zostavax be sent to [facility] [address] in [place]. He has not checked with his insurance but says he will pay for it if it's not covered.

## 2011-04-03 NOTE — Telephone Encounter (Signed)
Patient notified by telephone that rx has been sent to the pharmacy per his request.

## 2011-05-08 ENCOUNTER — Ambulatory Visit (INDEPENDENT_AMBULATORY_CARE_PROVIDER_SITE_OTHER): Payer: Medicare Other | Admitting: Family Medicine

## 2011-05-08 ENCOUNTER — Encounter: Payer: Self-pay | Admitting: Family Medicine

## 2011-05-08 VITALS — BP 146/68 | HR 60 | Temp 97.2°F | Ht 68.0 in | Wt 148.2 lb

## 2011-05-08 DIAGNOSIS — F329 Major depressive disorder, single episode, unspecified: Secondary | ICD-10-CM

## 2011-05-08 DIAGNOSIS — Z23 Encounter for immunization: Secondary | ICD-10-CM

## 2011-05-08 DIAGNOSIS — IMO0002 Reserved for concepts with insufficient information to code with codable children: Secondary | ICD-10-CM

## 2011-05-08 DIAGNOSIS — R451 Restlessness and agitation: Secondary | ICD-10-CM

## 2011-05-08 DIAGNOSIS — E119 Type 2 diabetes mellitus without complications: Secondary | ICD-10-CM

## 2011-05-08 DIAGNOSIS — F1011 Alcohol abuse, in remission: Secondary | ICD-10-CM

## 2011-05-08 DIAGNOSIS — N400 Enlarged prostate without lower urinary tract symptoms: Secondary | ICD-10-CM

## 2011-05-08 MED ORDER — SERTRALINE HCL 100 MG PO TABS: 100.0000 mg | ORAL_TABLET | Freq: Every day | ORAL | Status: DC

## 2011-05-08 NOTE — Assessment & Plan Note (Signed)
Improved. Cont increased dose of Sertraline at 100mg  a day.

## 2011-05-08 NOTE — Progress Notes (Signed)
  Subjective:    Patient ID: Justin Benson, male    DOB: 12-Jan-1935, 76 y.o.   MRN: [id number]  HPI Pt here for follow up of wine intake, agitation which probably prompted the wine increase and again recheck of BPH, now on Proscar alone. He has not had wine since being seen last time and had not had any for a few weeks prior to that. He is tolerating the higher dose of Sertraline at 100mg  without difficulty and he thinks this has helped. He still typically gets up 1-2 times a nite, which is bearable and an improvement. He continues to be careful with his sugar intake.  He has no complaints today and feels well.    Review of SystemsNoncontributory except as above.       Objective:   Physical Exam  Constitutional: He appears well-developed and well-nourished. No distress.  HENT:  Head: Normocephalic and atraumatic.  Right Ear: External ear normal.  Left Ear: External ear normal.  Nose: Nose normal.  Mouth/Throat: Oropharynx is clear and moist.  Eyes: Conjunctivae and EOM are normal. Pupils are equal, round, and reactive to light. Right eye exhibits no discharge. Left eye exhibits no discharge.  Neck: Normal range of motion. Neck supple.  Cardiovascular: Normal rate and regular rhythm.   Pulmonary/Chest: Effort normal and breath sounds normal. He has no wheezes.  Lymphadenopathy:    He has no cervical adenopathy.  Skin: He is not diaphoretic.          Assessment & Plan:

## 2011-05-08 NOTE — Patient Instructions (Signed)
Cont curr meds and lifestyle.  Call after first of year for appt for PE in  Feb. Will transfer to Dr G at the end of the year.

## 2011-05-08 NOTE — Assessment & Plan Note (Signed)
Now again totally off wine. He feels better altho still admits to desire at times. Encouraged.

## 2011-05-08 NOTE — Assessment & Plan Note (Signed)
Symptoms stable currently on Proscar alone. Discussed adding back Flomax if needed.

## 2011-05-08 NOTE — Assessment & Plan Note (Signed)
Seems well controlled. Cont curr meds. Denies SI/HI.

## 2011-05-08 NOTE — Assessment & Plan Note (Signed)
Still with great control avoiding sweets and carbs. Continue.

## 2011-08-15 ENCOUNTER — Other Ambulatory Visit: Payer: Self-pay | Admitting: Family Medicine

## 2011-09-08 ENCOUNTER — Other Ambulatory Visit: Payer: Self-pay | Admitting: Family Medicine

## 2011-10-15 ENCOUNTER — Other Ambulatory Visit: Payer: Self-pay | Admitting: Family Medicine

## 2011-11-20 ENCOUNTER — Telehealth: Payer: Self-pay | Admitting: Family Medicine

## 2011-11-20 DIAGNOSIS — E785 Hyperlipidemia, unspecified: Secondary | ICD-10-CM

## 2011-11-20 DIAGNOSIS — E119 Type 2 diabetes mellitus without complications: Secondary | ICD-10-CM

## 2011-11-20 DIAGNOSIS — N400 Enlarged prostate without lower urinary tract symptoms: Secondary | ICD-10-CM

## 2011-11-20 NOTE — Telephone Encounter (Signed)
Pt will be coming to see you he was a previous Dr. Hetty Ely pt. HE is coming next week for a f/u and he is saying that Dr. Hetty Ely usually schedules him a lab work before for a1c and liver panel. He wanted to come in the morning for his blood work.

## 2011-11-20 NOTE — Telephone Encounter (Signed)
Placed order in chart.

## 2011-11-21 ENCOUNTER — Other Ambulatory Visit (INDEPENDENT_AMBULATORY_CARE_PROVIDER_SITE_OTHER): Payer: Medicare Other

## 2011-11-21 DIAGNOSIS — E785 Hyperlipidemia, unspecified: Secondary | ICD-10-CM

## 2011-11-21 DIAGNOSIS — N400 Enlarged prostate without lower urinary tract symptoms: Secondary | ICD-10-CM

## 2011-11-21 DIAGNOSIS — E119 Type 2 diabetes mellitus without complications: Secondary | ICD-10-CM

## 2011-11-21 LAB — LIPID PANEL
LDL Cholesterol: 74 mg/dL (ref 0–99)
Total CHOL/HDL Ratio: 3

## 2011-11-21 LAB — COMPREHENSIVE METABOLIC PANEL
ALT: 61 U/L — ABNORMAL HIGH (ref 0–53)
Albumin: 4.2 g/dL (ref 3.5–5.2)
Alkaline Phosphatase: 79 U/L (ref 39–117)
Glucose, Bld: 106 mg/dL — ABNORMAL HIGH (ref 70–99)
Potassium: 4.9 mEq/L (ref 3.5–5.1)
Sodium: 140 mEq/L (ref 135–145)
Total Protein: 6.4 g/dL (ref 6.0–8.3)

## 2011-11-21 LAB — MICROALBUMIN / CREATININE URINE RATIO: Microalb, Ur: 0.5 mg/dL (ref 0.0–1.9)

## 2011-11-21 LAB — HEMOGLOBIN A1C: Hgb A1c MFr Bld: 6.2 % (ref 4.6–6.5)

## 2011-11-27 ENCOUNTER — Ambulatory Visit (INDEPENDENT_AMBULATORY_CARE_PROVIDER_SITE_OTHER): Payer: Medicare Other | Admitting: Family Medicine

## 2011-11-27 ENCOUNTER — Encounter: Payer: Self-pay | Admitting: Family Medicine

## 2011-11-27 VITALS — BP 162/72 | HR 72 | Temp 97.2°F | Wt 146.5 lb

## 2011-11-27 DIAGNOSIS — F1011 Alcohol abuse, in remission: Secondary | ICD-10-CM

## 2011-11-27 DIAGNOSIS — I10 Essential (primary) hypertension: Secondary | ICD-10-CM

## 2011-11-27 DIAGNOSIS — F329 Major depressive disorder, single episode, unspecified: Secondary | ICD-10-CM

## 2011-11-27 DIAGNOSIS — E119 Type 2 diabetes mellitus without complications: Secondary | ICD-10-CM

## 2011-11-27 MED ORDER — FINASTERIDE 5 MG PO TABS: 5.0000 mg | ORAL_TABLET | Freq: Every day | ORAL | Status: DC

## 2011-11-27 MED ORDER — QUINAPRIL-HYDROCHLOROTHIAZIDE 10-12.5 MG PO TABS: 1.0000 | ORAL_TABLET | Freq: Every day | ORAL | Status: DC

## 2011-11-27 MED ORDER — SERTRALINE HCL 100 MG PO TABS: 150.0000 mg | ORAL_TABLET | Freq: Every day | ORAL | Status: DC

## 2011-11-27 MED ORDER — QUINAPRIL HCL 40 MG PO TABS: 40.0000 mg | ORAL_TABLET | Freq: Every day | ORAL | Status: DC

## 2011-11-27 MED ORDER — ATORVASTATIN CALCIUM 10 MG PO TABS: 10.0000 mg | ORAL_TABLET | Freq: Every day | ORAL | Status: DC

## 2011-11-27 NOTE — Assessment & Plan Note (Signed)
Increase zoloft to 150mg , reassess next visit.

## 2011-11-27 NOTE — Patient Instructions (Signed)
Increase zoloft to 1 1/2 tablets daily (150mg ). Refilled all meds to [facility]. Let me know if questions. Return in 6 months for medicare wellness visit, fasting labs prior. Work on decreasing alcohol intake

## 2011-11-27 NOTE — Assessment & Plan Note (Signed)
Recent increase in EtOH use again.  Discussed reasons to stop.  Discussed EtOH is depressant.

## 2011-11-27 NOTE — Assessment & Plan Note (Signed)
BP elevated today - reassess next visit.  Does not check at home.

## 2011-11-27 NOTE — Progress Notes (Signed)
Subjective:    Patient ID: Justin Benson, male    DOB: [birth year], 77 y.o.   MRN: [id number]  HPI CC: transfer of care from Dr. Hetty Ely  [facility] [phone] pt of Dr. Lorenza Chick presents to establish with myself.  Walks 1+ mi daily.  Not SOB with this.  No CP with this.  DM - dx early 2000s.  Compliant and tolerating metformin.  Checks sugars infrequently.  Typically sugars under good control, high fasting are 130s.  Gets yearly eye exams.  01/2012 due.  No recent foot exam. Lab Results  Component Value Date   HGBA1C 6.2 11/21/2011   HTN - No vision changes, CP/tightness, SOB, leg swelling.  Endorses occasional headaches.  No dizziness but occasional headache.  Takes quinapril 40mg  nightly, quinapril hctz in am as well.  HLD - tolerant of lipitor nightly.  H/o BPH on finasteride.  ?Depression - when zoloft started, felt much better.  zoloft recently doubled to 100mg  daily.  Having episodes of overwhelming sadness.  Finds sometimes has trouble controlling EtOH intake - drinks 3x/wk, 1 bottle of wine per sitting.  Mild increase in insomnia.  Feels groggy when awakens.  Concentration ok.  Energy level ok.  Lives alone.  Last bottle of wine was 1 wk ago.  Denies recent life change or stressful event that has brought depressed mood.  Medications and allergies reviewed and updated in chart.  Past histories reviewed and updated if relevant as below. Patient Active Problem List  Diagnoses  . COLONIC POLYPS  . DIABETES MELLITUS, TYPE II  . HYPERLIPIDEMIA  . ABUSE, ALCOHOL, IN REMISSION (QUIT 06/1999) H/O INCR LFTS  . NICOTINE ADDICTION  QUIT 1994  80PYH  . DEPRESSION  . HYPERTENSION  . COPD  . DIVERTICULOSIS OF COLON  . BLOOD IN STOOL  . BENIGN PROSTATIC HYPERTROPHY  . Agitation   Past Medical History  Diagnosis Date  . Diabetes mellitus 4/02  . Hypertension 1996  . COPD (chronic obstructive pulmonary disease)   . Depression   . Hyperlipidemia   . BPH (benign prostatic hypertrophy)     . Pneumonia as a baby   Past Surgical History  Procedure Date  . Circumcision 10/30/09    Dr Patsi Sears   History  Substance Use Topics  . Smoking status: Former Smoker    Types: Cigarettes    Quit date: 07/29/1992  . Smokeless tobacco: Never Used  . Alcohol Use: No   Family History  Problem Relation Age of Onset  . Prostate cancer Father   . Aneurysm Father     aortic  . Stroke Father   . Hodgkin's lymphoma Brother 20  . Prostate cancer Brother 58    1/2  . Prostate cancer Brother 64    1/2  . Other Sister     cerebral hemm  . Lupus Sister   . Colon cancer Neg Hx   . Diabetes Brother    No Known Allergies Current Outpatient Prescriptions on File Prior to Visit  Medication Sig Dispense Refill  . aspirin 81 MG tablet Take 81 mg by mouth daily.        Marland Kitchen atorvastatin (LIPITOR) 10 MG tablet TAKE 1 TABLET DAILY AT BEDTIME  90 tablet  0  . finasteride (PROSCAR) 5 MG tablet TAKE 1 TABLET DAILY  90 tablet  0  . metFORMIN (GLUCOPHAGE) 500 MG tablet TAKE 1 TABLET IN THE MORNING AND 2 TABLETS AT NIGHT  270 tablet  3  . Multiple Vitamins-Minerals (HCA MULTIVITAMIN/MINERALS PO) Take  1 capsule by mouth daily.        . quinapril (ACCUPRIL) 40 MG tablet Take 40 mg by mouth at bedtime.       . quinapril-hydrochlorothiazide (ACCURETIC) 10-12.5 MG per tablet TAKE 1 TABLET DAILY  90 tablet  0  . sertraline (ZOLOFT) 100 MG tablet Take 1 tablet (100 mg total) by mouth daily.  90 tablet  3    Review of Systems Per HPI    Objective:   Physical Exam  Nursing note and vitals reviewed. Constitutional: He is oriented to person, place, and time. He appears well-developed and well-nourished. No distress.  HENT:  Head: Normocephalic and atraumatic.  Right Ear: External ear normal.  Left Ear: External ear normal.  Nose: Nose normal.  Mouth/Throat: Oropharynx is clear and moist. No oropharyngeal exudate.  Eyes: Conjunctivae and EOM are normal. Pupils are equal, round, and reactive to light.  No scleral icterus.  Neck: Normal range of motion. Neck supple. Carotid bruit is not present.  Cardiovascular: Normal rate, regular rhythm, normal heart sounds and intact distal pulses.   No murmur heard. Pulses:      Radial pulses are 2+ on the right side, and 2+ on the left side.  Pulmonary/Chest: Effort normal and breath sounds normal. No respiratory distress. He has no wheezes. He has no rales.  Musculoskeletal: Normal range of motion. He exhibits no edema.       Diabetic foot exam: Normal inspection No skin breakdown No calluses  Normal DP/PT pulses Normal sensation to light touch and monofilament Nails normal  Lymphadenopathy:    He has no cervical adenopathy.  Neurological: He is alert and oriented to person, place, and time.       CN grossly intact, station and gait intact  Skin: Skin is warm and dry. No rash noted.  Psychiatric: He has a normal mood and affect. His behavior is normal. Judgment and thought content normal.       Assessment & Plan:

## 2011-11-27 NOTE — Assessment & Plan Note (Signed)
Lab Results  Component Value Date   HGBA1C 6.2 11/21/2011  great control on current regimen of metformin. Continue.

## 2012-04-22 ENCOUNTER — Other Ambulatory Visit: Payer: Self-pay | Admitting: *Deleted

## 2012-04-22 MED ORDER — QUINAPRIL-HYDROCHLOROTHIAZIDE 10-12.5 MG PO TABS: 1.0000 | ORAL_TABLET | Freq: Every day | ORAL | Status: DC

## 2012-05-21 ENCOUNTER — Other Ambulatory Visit: Payer: Self-pay | Admitting: Family Medicine

## 2012-05-21 DIAGNOSIS — E785 Hyperlipidemia, unspecified: Secondary | ICD-10-CM

## 2012-05-21 DIAGNOSIS — I10 Essential (primary) hypertension: Secondary | ICD-10-CM

## 2012-05-21 DIAGNOSIS — E119 Type 2 diabetes mellitus without complications: Secondary | ICD-10-CM

## 2012-05-26 ENCOUNTER — Other Ambulatory Visit (INDEPENDENT_AMBULATORY_CARE_PROVIDER_SITE_OTHER): Payer: Medicare Other

## 2012-05-26 DIAGNOSIS — E119 Type 2 diabetes mellitus without complications: Secondary | ICD-10-CM

## 2012-05-26 DIAGNOSIS — E785 Hyperlipidemia, unspecified: Secondary | ICD-10-CM

## 2012-05-26 DIAGNOSIS — I10 Essential (primary) hypertension: Secondary | ICD-10-CM

## 2012-05-26 LAB — COMPREHENSIVE METABOLIC PANEL
ALT: 53 U/L (ref 0–53)
AST: 26 U/L (ref 0–37)
Alkaline Phosphatase: 81 U/L (ref 39–117)
Sodium: 138 mEq/L (ref 135–145)
Total Bilirubin: 0.6 mg/dL (ref 0.3–1.2)
Total Protein: 6.7 g/dL (ref 6.0–8.3)

## 2012-05-26 LAB — TSH: TSH: 3.16 u[IU]/mL (ref 0.35–5.50)

## 2012-05-26 LAB — HEMOGLOBIN A1C: Hgb A1c MFr Bld: 6.3 % (ref 4.6–6.5)

## 2012-05-29 ENCOUNTER — Ambulatory Visit: Payer: Medicare Other | Admitting: Family Medicine

## 2012-05-29 ENCOUNTER — Ambulatory Visit (INDEPENDENT_AMBULATORY_CARE_PROVIDER_SITE_OTHER): Payer: Medicare Other | Admitting: Family Medicine

## 2012-05-29 ENCOUNTER — Encounter: Payer: Self-pay | Admitting: Family Medicine

## 2012-05-29 VITALS — BP 150/70 | HR 64 | Temp 97.7°F | Ht 69.5 in | Wt 142.2 lb

## 2012-05-29 DIAGNOSIS — R079 Chest pain, unspecified: Secondary | ICD-10-CM | POA: Insufficient documentation

## 2012-05-29 DIAGNOSIS — D126 Benign neoplasm of colon, unspecified: Secondary | ICD-10-CM

## 2012-05-29 DIAGNOSIS — Z0000 Encounter for general adult medical examination without abnormal findings: Secondary | ICD-10-CM

## 2012-05-29 DIAGNOSIS — F329 Major depressive disorder, single episode, unspecified: Secondary | ICD-10-CM

## 2012-05-29 DIAGNOSIS — I10 Essential (primary) hypertension: Secondary | ICD-10-CM

## 2012-05-29 DIAGNOSIS — K5730 Diverticulosis of large intestine without perforation or abscess without bleeding: Secondary | ICD-10-CM

## 2012-05-29 DIAGNOSIS — E119 Type 2 diabetes mellitus without complications: Secondary | ICD-10-CM

## 2012-05-29 DIAGNOSIS — Z23 Encounter for immunization: Secondary | ICD-10-CM

## 2012-05-29 DIAGNOSIS — E785 Hyperlipidemia, unspecified: Secondary | ICD-10-CM

## 2012-05-29 DIAGNOSIS — N400 Enlarged prostate without lower urinary tract symptoms: Secondary | ICD-10-CM

## 2012-05-29 DIAGNOSIS — F1011 Alcohol abuse, in remission: Secondary | ICD-10-CM

## 2012-05-29 DIAGNOSIS — I714 Abdominal aortic aneurysm, without rupture, unspecified: Secondary | ICD-10-CM

## 2012-05-29 DIAGNOSIS — J449 Chronic obstructive pulmonary disease, unspecified: Secondary | ICD-10-CM

## 2012-05-29 DIAGNOSIS — F17200 Nicotine dependence, unspecified, uncomplicated: Secondary | ICD-10-CM

## 2012-05-29 HISTORY — DX: Abdominal aortic aneurysm, without rupture, unspecified: I71.40

## 2012-05-29 HISTORY — DX: Abdominal aortic aneurysm, without rupture: I71.4

## 2012-05-29 MED ORDER — FINASTERIDE 5 MG PO TABS: 5.0000 mg | ORAL_TABLET | Freq: Every day | ORAL | Status: DC

## 2012-05-29 MED ORDER — HYDROCHLOROTHIAZIDE 12.5 MG PO TABS: 12.5000 mg | ORAL_TABLET | Freq: Every day | ORAL | Status: DC

## 2012-05-29 NOTE — Assessment & Plan Note (Signed)
Discussed being seen for LLQ pain longer than a few days.

## 2012-05-29 NOTE — Assessment & Plan Note (Signed)
Due for rpt colonoscopy 2015 Justin Benson)

## 2012-05-29 NOTE — Assessment & Plan Note (Signed)
Continue proscar.   Lab Results  Component Value Date   PSA 0.46 11/21/2011   PSA 1.32 09/03/2010   PSA 1.24 07/17/2009

## 2012-05-29 NOTE — Assessment & Plan Note (Signed)
Not at goal nor has it been for several visits. Increase HCTZ to 25mg  total (adding 12.5mg  daily).  Consider change to enalpril hctz 20/12.5 bid in future vs addition of beta blocker. Advised him to buy cuff and check at home, if dizziness to stop new HCTZ 12.5mg .

## 2012-05-29 NOTE — Assessment & Plan Note (Signed)
Minimal.  Remote smoker.

## 2012-05-29 NOTE — Assessment & Plan Note (Signed)
With intermittent continued use of EtOH.  encouraged complete abstinence. Already on MVI daily.  LFTs WNL

## 2012-05-29 NOTE — Assessment & Plan Note (Signed)
Chronic, stable on metformin. Continue. Lab Results  Component Value Date   HGBA1C 6.3 05/26/2012

## 2012-05-29 NOTE — Assessment & Plan Note (Signed)
I have personally reviewed the Medicare Annual Wellness questionnaire and have noted 1. The patient's medical and social history 2. Their use of alcohol, tobacco or illicit drugs 3. Their current medications and supplements 4. The patient's functional ability including ADL's, fall risks, home safety risks and hearing or visual impairment. 5. Diet and physical activity 6. Evidence for depression or mood disorders The patients weight, height, BMI have been recorded in the chart.  Hearing and vision has been addressed. I have made referrals, counseling and provided education to the patient based review of the above and I have provided the pt with a written personalized care plan for preventive services. See scanned questionairre. Advanced directives discussed: asked him to bring copy for our records.  Daughter would be HCPOA.  Reviewed preventative protocols and updated unless pt declined. Flu shot today. utd colonoscopy and PSA.  Strong fmhx prostate cancer.

## 2012-05-29 NOTE — Progress Notes (Signed)
Subjective:    Patient ID: Justin Benson, male    DOB: Aug 24, 1934, 77 y.o.   MRN: [id number]  HPI CC: medicare wellness.  HTN - recently increased blood pressure.  No HA, vision changes, CP/tightness, SOB, leg swelling. BP Readings from Last 3 Encounters:  05/29/12 150/70  11/27/11 162/72  05/08/11 146/68    H/o EtOH abuse - decreased drinking.  Now down to 2 bottles of wine on weekends.  Chest pain - occasionally, especially notices with walking up hill near house.  Describes pressure sensation in left substernal chest.  Associated with shortness of breath.  No strong fmhx CAD.    Depression - last visit increased zoloft to 150mg  daily, feels this has significantly helped mood.  H/o COPD, quit smoking 1994.  No trouble breathing. Diabetes type 2 - controlled on metformin. HLD - well controlled.  Hearing and vision screens passed today.  Sees eye doctor yearly.  Does have some tinnitus. No falls in last year. Mood overall stable on celexa.  Denies anhedonia.  Preventative: Strong fmhx prostate cancer.  On finasteride for BPH. Lab Results  Component Value Date   PSA 0.46 11/21/2011   PSA 1.32 09/03/2010   PSA 1.24 07/17/2009  Colon cancer screening - Dr. Christella Hartigan colonoscopy  - 2 small polyps, diverticulosis, lipoma, int/ext hemorrhoids (12/2008) rec rpt 5 yrs Flu shot today Td 2011 Pneumovax 2004 zostavax 2012 Advanced directives: has copy of this at home.  HCPOA would be daughter, Jozeph Persing in [place].  Wt Readings from Last 3 Encounters:  05/29/12 142 lb 4 oz (64.524 kg)  11/27/11 146 lb 8 oz (66.452 kg)  05/08/11 148 lb 4 oz (67.246 kg)   Medications and allergies reviewed and updated in chart.  Past histories reviewed and updated if relevant as below. Patient Active Problem List  Diagnosis  . COLONIC POLYPS  . DIABETES MELLITUS, TYPE II  . HYPERLIPIDEMIA  . ABUSE, ALCOHOL, IN REMISSION (QUIT 06/1999) H/O INCR LFTS  . NICOTINE ADDICTION  QUIT 1994   80PYH  . DEPRESSION  . HYPERTENSION  . COPD  . DIVERTICULOSIS OF COLON  . BLOOD IN STOOL  . BENIGN PROSTATIC HYPERTROPHY  . Agitation   Past Medical History  Diagnosis Date  . Diabetes mellitus 4/02  . Hypertension 1996  . COPD (chronic obstructive pulmonary disease)   . Depression   . Hyperlipidemia   . BPH (benign prostatic hypertrophy)   . Pneumonia as a baby   Past Surgical History  Procedure Date  . Circumcision 10/30/09    Dr Patsi Sears  . Colonoscopy 11/2008    2 small polyps (1 tubular), diverticulosis, lipoma, int/ext hemorrhoids Christella Hartigan)   History  Substance Use Topics  . Smoking status: Former Smoker    Types: Cigarettes    Quit date: 07/29/1992  . Smokeless tobacco: Never Used  . Alcohol Use: Yes     Regular (2 days/week)   Family History  Problem Relation Age of Onset  . Prostate cancer Father   . Aneurysm Father     aortic  . Stroke Father   . Hodgkin's lymphoma Brother 20  . Prostate cancer Brother 44    1/2  . Prostate cancer Brother 77    1/2  . Other Sister     cerebral hemm  . Lupus Sister   . Colon cancer Neg Hx   . Diabetes Brother    No Known Allergies Current Outpatient Prescriptions on File Prior to Visit  Medication Sig Dispense Refill  .  aspirin 81 MG tablet Take 81 mg by mouth daily.        Marland Kitchen atorvastatin (LIPITOR) 10 MG tablet Take 1 tablet (10 mg total) by mouth at bedtime.  90 tablet  3  . metFORMIN (GLUCOPHAGE) 500 MG tablet TAKE 1 TABLET IN THE MORNING AND 2 TABLETS AT NIGHT  270 tablet  3  . Multiple Vitamins-Minerals (HCA MULTIVITAMIN/MINERALS PO) Take 1 capsule by mouth daily.        . quinapril (ACCUPRIL) 40 MG tablet Take 1 tablet (40 mg total) by mouth at bedtime.  90 tablet  3  . quinapril-hydrochlorothiazide (ACCURETIC) 10-12.5 MG per tablet Take 1 tablet by mouth daily.  90 tablet  3  . sertraline (ZOLOFT) 100 MG tablet Take 1.5 tablets (150 mg total) by mouth daily.  135 tablet  3  . hydrochlorothiazide (HYDRODIURIL)  12.5 MG tablet Take 1 tablet (12.5 mg total) by mouth daily.  90 tablet  3    Review of Systems  Constitutional: Negative for fever, chills, activity change, appetite change, fatigue and unexpected weight change.  HENT: Negative for hearing loss and neck pain.   Eyes: Negative for visual disturbance.  Respiratory: Positive for chest tightness (occasionally substernal chest pain with walking up hill) and shortness of breath (occasionally when walking up hill near home). Negative for cough and wheezing.   Cardiovascular: Negative for chest pain, palpitations and leg swelling.  Gastrointestinal: Negative for nausea, vomiting, abdominal pain, diarrhea, constipation, blood in stool and abdominal distention.  Genitourinary: Negative for hematuria and difficulty urinating.  Musculoskeletal: Negative for myalgias and arthralgias.  Skin: Negative for rash.  Neurological: Positive for headaches (mild). Negative for dizziness, seizures and syncope.  Hematological: Does not bruise/bleed easily.  Psychiatric/Behavioral: Negative for dysphoric mood. The patient is not nervous/anxious.        Objective:   Physical Exam  Nursing note and vitals reviewed. Constitutional: He is oriented to person, place, and time. He appears well-developed and well-nourished. No distress.  HENT:  Head: Normocephalic and atraumatic.  Right Ear: Hearing, tympanic membrane, external ear and ear canal normal.  Left Ear: Hearing, tympanic membrane, external ear and ear canal normal.  Nose: Nose normal.  Mouth/Throat: Oropharynx is clear and moist. No oropharyngeal exudate.  Eyes: Conjunctivae normal and EOM are normal. Pupils are equal, round, and reactive to light. No scleral icterus.  Neck: Normal range of motion. Neck supple. No thyromegaly present.  Cardiovascular: Normal rate, regular rhythm, normal heart sounds and intact distal pulses.   No murmur heard. Pulses:      Radial pulses are 2+ on the right side, and 2+ on  the left side.  Pulmonary/Chest: Effort normal and breath sounds normal. No respiratory distress. He has no wheezes. He has no rales.  Abdominal: Soft. Bowel sounds are normal. He exhibits no distension and no mass. There is no tenderness. There is no rebound and no guarding.  Genitourinary: Rectal exam shows external hemorrhoid (minimal). Rectal exam shows no internal hemorrhoid, no fissure, no mass, no tenderness and anal tone normal. Prostate is enlarged (25gm). Prostate is not tender.  Musculoskeletal: Normal range of motion. He exhibits no edema.       Diabetic foot exam: Normal inspection No skin breakdown No calluses  Normal DP/PT pulses Normal sensation to light touch and monofilament Nails normal  Lymphadenopathy:    He has no cervical adenopathy.  Neurological: He is alert and oriented to person, place, and time.       CN grossly  intact, station and gait intact  Skin: Skin is warm and dry. No rash noted.  Psychiatric: He has a normal mood and affect. His behavior is normal. Judgment and thought content normal.      Assessment & Plan:

## 2012-05-29 NOTE — Patient Instructions (Addendum)
Let's add an extra pill of hydrochlorothiazide 12.5mg  daily.  Let's buy a bp cuff and keep track of blood pressures at home.  If any dizziness, stop extra hydrochlorothiazide.  Return in 2-3 months for blood pressure recheck. I encourage you to keep working on cutting back on alcohol. I'm glad sertraline increased dose is helping.  Let's continue this. Pass by Marion's office to schedule cardiology appointment. Flu shot today.

## 2012-05-29 NOTE — Assessment & Plan Note (Signed)
New dx, states ongoing for several months. Some concerning sxs for typical angina. Will refer to cards for further evaluation. Already working on controlling modifiable risk factors.

## 2012-05-29 NOTE — Assessment & Plan Note (Signed)
Chronic, stable. Well controlled on current lipitor dosing.  Continue.

## 2012-05-29 NOTE — Assessment & Plan Note (Signed)
improved on zoloft 150mg  daily.  Continue this dose.  Some subtance abuse hx, reports no abuse currently.

## 2012-06-08 ENCOUNTER — Encounter: Payer: Self-pay | Admitting: Cardiovascular Disease

## 2012-06-08 ENCOUNTER — Ambulatory Visit (INDEPENDENT_AMBULATORY_CARE_PROVIDER_SITE_OTHER): Payer: Medicare Other | Admitting: Cardiovascular Disease

## 2012-06-08 VITALS — BP 140/68 | HR 65 | Ht 69.5 in | Wt 140.5 lb

## 2012-06-08 DIAGNOSIS — E119 Type 2 diabetes mellitus without complications: Secondary | ICD-10-CM

## 2012-06-08 DIAGNOSIS — I10 Essential (primary) hypertension: Secondary | ICD-10-CM

## 2012-06-08 DIAGNOSIS — R079 Chest pain, unspecified: Secondary | ICD-10-CM

## 2012-06-08 DIAGNOSIS — R0989 Other specified symptoms and signs involving the circulatory and respiratory systems: Secondary | ICD-10-CM

## 2012-06-08 DIAGNOSIS — Z0000 Encounter for general adult medical examination without abnormal findings: Secondary | ICD-10-CM

## 2012-06-08 DIAGNOSIS — E785 Hyperlipidemia, unspecified: Secondary | ICD-10-CM

## 2012-06-08 NOTE — Assessment & Plan Note (Signed)
Cholesterol is at goal on the current lipid regimen. No changes to the medications were made.  

## 2012-06-08 NOTE — Patient Instructions (Addendum)
You are doing well. No medication changes were made.  We will schedule a ABD ultrasound for bruit, family history  We will also schedule a treadmill study for chest pain, rule out angina  Please call us if you have new issues that need to be addressed before your next appt.  Your physician wants you to follow-up in: 12 months.  You will receive a reminder letter in the mail two months in advance. If you don't receive a letter, please call our office to schedule the follow-up appointment.

## 2012-06-08 NOTE — Assessment & Plan Note (Addendum)
We have recommended an abdominal ultrasound given faint bruit on exam and family history of abdominal aneurysm.

## 2012-06-08 NOTE — Assessment & Plan Note (Signed)
Blood pressure is well controlled on today's visit. No changes made to the medications. 

## 2012-06-08 NOTE — Assessment & Plan Note (Signed)
We have encouraged continued exercise, careful diet management in an effort to lose weight. 

## 2012-06-08 NOTE — Assessment & Plan Note (Signed)
Chest pain with exertion. Stable over past several years. Currently well managed in terms of his medications. Symptoms are mild and typically only with heavy exertion. We have discussed the possible options and types of stress testing. He prefers routine treadmill testing. This will be scheduled at his convenience.

## 2012-06-08 NOTE — Progress Notes (Signed)
Patient ID: Justin Benson, male    DOB: 10/01/1934, 77 y.o.   MRN: [id number]  HPI Comments: And Justin Benson is a very pleasant 77 year old gentleman with remote history of smoking for 37 years who quit 20 years ago, chronic mild shortness of breath for at least 10 years, presenting with chest pain symptoms typically with exertion such as walking up inclines. He presents by referral from Dr. Sharen Benson for further evaluation.  He reports that he has had these symptoms for several years. He has similar symptoms with other heavy exertion such as raking leaves. He denies any lightheadedness, sweating. If he slows down, his symptoms typically resolve. No lower extremity edema. Symptoms have not been progressing significantly over the past several years and have been stable. He reports having a problem with GERD in the past but does not feel that this is a major issue.  Father had abdominal aortic aneurysm repair and died at age 36 Mother died at an early age, complications from surgery  EKG shows normal sinus rhythm with rate 65 beats per minute with no significant ST or T wave changes    Outpatient Encounter Prescriptions as of 06/08/2012  Medication Sig Dispense Refill  . aspirin 81 MG tablet Take 81 mg by mouth daily.        Marland Kitchen atorvastatin (LIPITOR) 10 MG tablet Take 1 tablet (10 mg total) by mouth at bedtime.  90 tablet  3  . finasteride (PROSCAR) 5 MG tablet Take 1 tablet (5 mg total) by mouth daily.  90 tablet  3  . hydrochlorothiazide (HYDRODIURIL) 12.5 MG tablet Take 1 tablet (12.5 mg total) by mouth daily.  90 tablet  3  . metFORMIN (GLUCOPHAGE) 500 MG tablet TAKE 1 TABLET IN THE MORNING AND 2 TABLETS AT NIGHT  270 tablet  3  . Multiple Vitamins-Minerals (HCA MULTIVITAMIN/MINERALS PO) Take 1 capsule by mouth daily.        . quinapril (ACCUPRIL) 40 MG tablet Take 1 tablet (40 mg total) by mouth at bedtime.  90 tablet  3  . quinapril-hydrochlorothiazide (ACCURETIC) 10-12.5 MG per tablet Take  1 tablet by mouth daily.  90 tablet  3  . sertraline (ZOLOFT) 100 MG tablet Take 1.5 tablets (150 mg total) by mouth daily.  135 tablet  3     Review of Systems  Constitutional: Negative.   HENT: Negative.   Eyes: Negative.   Respiratory: Negative.   Cardiovascular: Positive for chest pain.  Gastrointestinal: Negative.   Musculoskeletal: Negative.   Skin: Negative.   Neurological: Negative.   Hematological: Negative.   Psychiatric/Behavioral: Negative.   All other systems reviewed and are negative.    BP 140/68  Pulse 65  Ht 5' 9.5" (1.765 m)  Wt 140 lb 8 oz (63.73 kg)  BMI 20.45 kg/m2  Physical Exam  Nursing note and vitals reviewed. Constitutional: He is oriented to person, place, and time. He appears well-developed and well-nourished.  HENT:  Head: Normocephalic.  Nose: Nose normal.  Mouth/Throat: Oropharynx is clear and moist.  Eyes: Conjunctivae normal are normal. Pupils are equal, round, and reactive to light.  Neck: Normal range of motion. Neck supple. No JVD present.  Cardiovascular: Normal rate, regular rhythm, S1 normal, S2 normal, normal heart sounds and intact distal pulses.  Exam reveals no gallop and no friction rub.   No murmur heard.      Abdominal bruit noted  Pulmonary/Chest: Effort normal and breath sounds normal. No respiratory distress. He has no wheezes. He  has no rales. He exhibits no tenderness.  Abdominal: Soft. Bowel sounds are normal. He exhibits no distension. There is no tenderness.  Musculoskeletal: Normal range of motion. He exhibits no edema and no tenderness.  Lymphadenopathy:    He has no cervical adenopathy.  Neurological: He is alert and oriented to person, place, and time. Coordination normal.  Skin: Skin is warm and dry. No rash noted. No erythema.  Psychiatric: He has a normal mood and affect. His behavior is normal. Judgment and thought content normal.           Assessment and Plan

## 2012-07-07 ENCOUNTER — Encounter: Payer: Self-pay | Admitting: Cardiovascular Disease

## 2012-07-07 ENCOUNTER — Ambulatory Visit (INDEPENDENT_AMBULATORY_CARE_PROVIDER_SITE_OTHER): Payer: Medicare Other | Admitting: Cardiovascular Disease

## 2012-07-07 VITALS — BP 160/70 | HR 81 | Ht 69.0 in | Wt 146.8 lb

## 2012-07-07 DIAGNOSIS — R079 Chest pain, unspecified: Secondary | ICD-10-CM

## 2012-07-07 MED ORDER — AMLODIPINE BESYLATE 10 MG PO TABS: 10.0000 mg | ORAL_TABLET | Freq: Every day | ORAL | Status: DC

## 2012-07-07 NOTE — Procedures (Signed)
Exercise Treadmill Test   Treadmill ordered for recent epsiodes of chest pain.  Resting EKG shows NSR with rate of 71 bpm, nonspecific St changes, rare APCs. Resting blood pressure of 122/62. Stand bruce protocal was used.  Patient exercised for 3 min 7 sec Peak heart rate of 123 bpm.  This was 85% of the maximum predicted heart rate (target heart rate 123). No symptoms of chest pain or lightheadedness were reported at peak stress or in recovery.  Significant SOB.  Peak Blood pressure recorded was 210/64 Heart rate at 3 minutes in recovery was 120 bpm. ST depression of 1.1 mm with significant upsloping ST  FINAL IMPRESSION: Normal exercise stress test. No significant EKG changes concerning for ischemia (there is ST depression though with significant upslope). Poor exercise tolerance. Significant hypertensive response, poor blood pressure recovery (maintained in the 190 to 200 systolic range for more than 10 minutes) We will start amlodipine 10 mg daily. He will continue his other medications

## 2012-07-07 NOTE — Patient Instructions (Addendum)
Stress test did not suggest blockage.  If you have worsening shortness of breath, please call the office Please start amlodipine one a day for blood pressure  Please call us if you have new issues that need to be addressed before your next appt.  Your physician wants you to follow-up in: 6 months.  You will receive a reminder letter in the mail two months in advance. If you don't receive a letter, please call our office to schedule the follow-up appointment.

## 2012-07-30 DIAGNOSIS — R0989 Other specified symptoms and signs involving the circulatory and respiratory systems: Secondary | ICD-10-CM

## 2012-08-20 ENCOUNTER — Encounter (INDEPENDENT_AMBULATORY_CARE_PROVIDER_SITE_OTHER): Payer: Medicare Other

## 2012-08-20 DIAGNOSIS — I714 Abdominal aortic aneurysm, without rupture, unspecified: Secondary | ICD-10-CM

## 2012-08-20 DIAGNOSIS — R0989 Other specified symptoms and signs involving the circulatory and respiratory systems: Secondary | ICD-10-CM

## 2012-08-20 DIAGNOSIS — R079 Chest pain, unspecified: Secondary | ICD-10-CM

## 2012-08-31 ENCOUNTER — Encounter: Payer: Self-pay | Admitting: Family Medicine

## 2012-08-31 ENCOUNTER — Ambulatory Visit (INDEPENDENT_AMBULATORY_CARE_PROVIDER_SITE_OTHER): Payer: Medicare Other | Admitting: Family Medicine

## 2012-08-31 VITALS — BP 142/78 | HR 72 | Temp 97.9°F | Wt 144.2 lb

## 2012-08-31 DIAGNOSIS — I10 Essential (primary) hypertension: Secondary | ICD-10-CM

## 2012-08-31 MED ORDER — SERTRALINE HCL 100 MG PO TABS: 150.0000 mg | ORAL_TABLET | Freq: Every day | ORAL | Status: DC

## 2012-08-31 MED ORDER — AMLODIPINE BESYLATE 10 MG PO TABS: 10.0000 mg | ORAL_TABLET | Freq: Every day | ORAL | Status: DC

## 2012-08-31 MED ORDER — QUINAPRIL HCL 40 MG PO TABS: 40.0000 mg | ORAL_TABLET | Freq: Every day | ORAL | Status: DC

## 2012-08-31 MED ORDER — METFORMIN HCL 500 MG PO TABS: 500.0000 mg | ORAL_TABLET | ORAL | Status: DC

## 2012-08-31 MED ORDER — QUINAPRIL-HYDROCHLOROTHIAZIDE 20-25 MG PO TABS: 1.0000 | ORAL_TABLET | Freq: Every day | ORAL | Status: DC

## 2012-08-31 NOTE — Progress Notes (Signed)
  Subjective:    Patient ID: Justin Benson, male    DOB: 1934/09/03, 78 y.o.   MRN: [id number]  HPI CC: f/u HTN  Justin Benson presents today as 64mo f/u for persistently elevated BP.  Has been checking at home with wrist cuff - 140-160/70-80.  In interim, seen by Dr. Mariah Milling for persistent chest pain, had treadmill stress test which was normal but showed poor exercise tolerance and marked hypertensive response.  Walks 1 mi several times a week.  Walks briskly. He was surprised at how dyspneic he became with treadmill test. Ex smoker.  2 ppd for almost 40 yrs.  BP Readings from Last 3 Encounters:  08/31/12 142/78  07/07/12 160/70  06/08/12 140/68    Past Medical History  Diagnosis Date  . Diabetes mellitus 4/02  . Hypertension 1996  . COPD (chronic obstructive pulmonary disease)   . Depression   . Hyperlipidemia   . BPH (benign prostatic hypertrophy)   . Pneumonia as a baby  . AAA (abdominal aortic aneurysm) 05/2012    mild 2.3 cm , rpt 1 year     Review of Systems Per HPI    Objective:   Physical Exam  Nursing note and vitals reviewed. Constitutional: He appears well-developed and well-nourished. No distress.  HENT:  Head: Normocephalic and atraumatic.  Mouth/Throat: Oropharynx is clear and moist. No oropharyngeal exudate.  Eyes: Conjunctivae normal and EOM are normal. Pupils are equal, round, and reactive to light. No scleral icterus.  Neck: Normal range of motion. Neck supple. Carotid bruit is not present.  Cardiovascular: Normal rate, regular rhythm, normal heart sounds and intact distal pulses.   No murmur heard. Pulmonary/Chest: Effort normal and breath sounds normal. No respiratory distress. He has no wheezes. He has no rales.  Abdominal: Soft. Bowel sounds are normal.       Still do not appreciate bruit  Musculoskeletal: He exhibits no edema.  Skin: Skin is warm and dry.       Assessment & Plan:

## 2012-08-31 NOTE — Patient Instructions (Addendum)
Lets change quinapril hctz combo to 20/25 every day in am (you may take 2 pills of 10/12.5 until you run out). Continue amlodipine 10mg  daily Continue quinapril alone 40mg  daily. Stop HCTZ alone 12.5mg  daily. Return in 3 months for recheck blood pressure. i've refilled meds. Return in 1 week to check kidney function on higher dose of quinapril.

## 2012-08-31 NOTE — Assessment & Plan Note (Signed)
Chronic, still not at goal. Will increase quinapril hctz to 20/25 daily. Continue quinapril 40mg  nightly and amlodipine recently started by cards. rtc 1 wk for Cr check and 3 mo for recheck HTN. Pt agrees with plan.

## 2012-09-07 ENCOUNTER — Other Ambulatory Visit (INDEPENDENT_AMBULATORY_CARE_PROVIDER_SITE_OTHER): Payer: Medicare Other

## 2012-09-07 DIAGNOSIS — I10 Essential (primary) hypertension: Secondary | ICD-10-CM

## 2012-09-07 LAB — BASIC METABOLIC PANEL
CO2: 31 mEq/L (ref 19–32)
Calcium: 9.4 mg/dL (ref 8.4–10.5)
Creatinine, Ser: 0.8 mg/dL (ref 0.4–1.5)
Glucose, Bld: 164 mg/dL — ABNORMAL HIGH (ref 70–99)
Sodium: 137 mEq/L (ref 135–145)

## 2012-09-08 ENCOUNTER — Encounter: Payer: Self-pay | Admitting: *Deleted

## 2012-09-15 ENCOUNTER — Telehealth: Payer: Self-pay | Admitting: Family Medicine

## 2012-09-15 NOTE — Telephone Encounter (Signed)
[facility] states that they have not received any of pt's RX's that were supposed to be sent to them at pt's last OV on 09/01/2011.  It looks like medications were sent but I cannot see what pharmacy they were sent to.

## 2012-09-16 ENCOUNTER — Other Ambulatory Visit: Payer: Self-pay | Admitting: *Deleted

## 2012-09-16 MED ORDER — AMLODIPINE BESYLATE 10 MG PO TABS: 10.0000 mg | ORAL_TABLET | Freq: Every day | ORAL | Status: DC

## 2012-09-16 MED ORDER — SERTRALINE HCL 100 MG PO TABS: 150.0000 mg | ORAL_TABLET | Freq: Every day | ORAL | Status: DC

## 2012-09-16 MED ORDER — QUINAPRIL-HYDROCHLOROTHIAZIDE 20-25 MG PO TABS: 1.0000 | ORAL_TABLET | Freq: Every day | ORAL | Status: DC

## 2012-09-16 MED ORDER — QUINAPRIL HCL 40 MG PO TABS: 40.0000 mg | ORAL_TABLET | Freq: Every day | ORAL | Status: DC

## 2012-09-16 MED ORDER — METFORMIN HCL 500 MG PO TABS: 500.0000 mg | ORAL_TABLET | ORAL | Status: DC

## 2012-09-16 MED ORDER — ATORVASTATIN CALCIUM 10 MG PO TABS: 10.0000 mg | ORAL_TABLET | Freq: Every day | ORAL | Status: DC

## 2012-09-16 NOTE — Telephone Encounter (Signed)
Meds were refilled to [facility] at last OV. Resent today.

## 2012-09-16 NOTE — Telephone Encounter (Signed)
Refilled as requested  

## 2012-11-26 DIAGNOSIS — D334 Benign neoplasm of spinal cord: Secondary | ICD-10-CM | POA: Insufficient documentation

## 2012-11-26 DIAGNOSIS — K8050 Calculus of bile duct without cholangitis or cholecystitis without obstruction: Secondary | ICD-10-CM

## 2012-11-26 HISTORY — DX: Calculus of bile duct without cholangitis or cholecystitis without obstruction: K80.50

## 2012-11-30 ENCOUNTER — Ambulatory Visit (INDEPENDENT_AMBULATORY_CARE_PROVIDER_SITE_OTHER)
Admission: RE | Admit: 2012-11-30 | Discharge: 2012-11-30 | Disposition: A | Discharge status: Home / Self Care | Payer: Medicare Other | Source: Ambulatory Visit | Attending: Family Medicine | Admitting: Family Medicine

## 2012-11-30 ENCOUNTER — Encounter: Payer: Self-pay | Admitting: Family Medicine

## 2012-11-30 ENCOUNTER — Ambulatory Visit (INDEPENDENT_AMBULATORY_CARE_PROVIDER_SITE_OTHER): Payer: Medicare Other | Admitting: Family Medicine

## 2012-11-30 VITALS — BP 138/82 | HR 68 | Temp 97.8°F | Wt 138.2 lb

## 2012-11-30 DIAGNOSIS — M25519 Pain in unspecified shoulder: Secondary | ICD-10-CM

## 2012-11-30 DIAGNOSIS — R490 Dysphonia: Secondary | ICD-10-CM

## 2012-11-30 DIAGNOSIS — L989 Disorder of the skin and subcutaneous tissue, unspecified: Secondary | ICD-10-CM | POA: Insufficient documentation

## 2012-11-30 DIAGNOSIS — R634 Abnormal weight loss: Secondary | ICD-10-CM

## 2012-11-30 DIAGNOSIS — E119 Type 2 diabetes mellitus without complications: Secondary | ICD-10-CM

## 2012-11-30 DIAGNOSIS — I10 Essential (primary) hypertension: Secondary | ICD-10-CM

## 2012-11-30 DIAGNOSIS — N400 Enlarged prostate without lower urinary tract symptoms: Secondary | ICD-10-CM

## 2012-11-30 DIAGNOSIS — R748 Abnormal levels of other serum enzymes: Secondary | ICD-10-CM

## 2012-11-30 DIAGNOSIS — M25511 Pain in right shoulder: Secondary | ICD-10-CM | POA: Insufficient documentation

## 2012-11-30 LAB — CBC WITH DIFFERENTIAL/PLATELET
Basophils Absolute: 0 10*3/uL (ref 0.0–0.1)
Eosinophils Absolute: 0.1 10*3/uL (ref 0.0–0.7)
Hemoglobin: 13.3 g/dL (ref 13.0–17.0)
Lymphocytes Relative: 22.9 % (ref 12.0–46.0)
MCHC: 34 g/dL (ref 30.0–36.0)
Neutro Abs: 3.2 10*3/uL (ref 1.4–7.7)
RDW: 15.1 % — ABNORMAL HIGH (ref 11.5–14.6)

## 2012-11-30 LAB — HEMOGLOBIN A1C: Hgb A1c MFr Bld: 7.2 % — ABNORMAL HIGH (ref 4.6–6.5)

## 2012-11-30 MED ORDER — ATORVASTATIN CALCIUM 10 MG PO TABS: 10.0000 mg | ORAL_TABLET | Freq: Every day | ORAL | Status: DC

## 2012-11-30 NOTE — Assessment & Plan Note (Signed)
Recheck today. 

## 2012-11-30 NOTE — Assessment & Plan Note (Signed)
Improved bp.  No changes today. BP Readings from Last 3 Encounters:  11/30/12 138/82  08/31/12 142/78  07/07/12 160/70

## 2012-11-30 NOTE — Assessment & Plan Note (Signed)
I have referred to derm for eval of AKs and horn lesions.

## 2012-11-30 NOTE — Assessment & Plan Note (Signed)
About 10 lb weight loss in last 5 months, not trying. Long remote smoking history (80 PY) - will check CXR today (hyperinflated but o/w clear on my read). Also endorsing several month history of mild hoarseness, will refer to ENT for further evaluation although exam today unremarkable. Will check basic blood work including CBC, CMP, TSH. Will also check PSA given strong fmhx (on 5a reductase inhibitor).
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
## 2012-11-30 NOTE — Patient Instructions (Addendum)
I do want [place] to watch weight closely. Blood work today. Chest xray today. Pass by Marion's office for referral to ENT to check on hoarseness and dermatologist referral. Return to see me in 4-6 weeks to recheck weight.

## 2012-11-30 NOTE — Assessment & Plan Note (Signed)
After fall several months back - anticipate RTC injury. Provided with stretching exercises from [facility] patient advisor booklet as well as resistance band.

## 2012-12-01 ENCOUNTER — Other Ambulatory Visit: Payer: Self-pay | Admitting: Family Medicine

## 2012-12-01 DIAGNOSIS — R740 Nonspecific elevation of levels of transaminase and lactic acid dehydrogenase [LDH]: Secondary | ICD-10-CM

## 2012-12-01 DIAGNOSIS — R7401 Elevation of levels of liver transaminase levels: Secondary | ICD-10-CM | POA: Insufficient documentation

## 2012-12-01 LAB — TSH: TSH: 2.35 u[IU]/mL (ref 0.35–5.50)

## 2012-12-01 LAB — COMPREHENSIVE METABOLIC PANEL
ALT: 317 U/L — ABNORMAL HIGH (ref 0–53)
AST: 183 U/L — ABNORMAL HIGH (ref 0–37)
Albumin: 4.5 g/dL (ref 3.5–5.2)
Calcium: 9.9 mg/dL (ref 8.4–10.5)
Chloride: 99 mEq/L (ref 96–112)
Creatinine, Ser: 0.8 mg/dL (ref 0.4–1.5)
Potassium: 5.3 mEq/L — ABNORMAL HIGH (ref 3.5–5.1)
Sodium: 134 mEq/L — ABNORMAL LOW (ref 135–145)

## 2012-12-01 NOTE — Addendum Note (Signed)
Addended by: Alvina Chou on: 12/01/2012 02:13 PM   Modules accepted: Orders

## 2012-12-02 LAB — HEPATITIS PANEL, ACUTE: Hepatitis B Surface Ag: NEGATIVE

## 2012-12-04 ENCOUNTER — Ambulatory Visit
Admission: RE | Admit: 2012-12-04 | Discharge: 2012-12-04 | Disposition: A | Discharge status: Home / Self Care | Payer: Medicare Other | Source: Ambulatory Visit | Attending: Family Medicine | Admitting: Family Medicine

## 2012-12-06 ENCOUNTER — Other Ambulatory Visit: Payer: Self-pay | Admitting: Family Medicine

## 2012-12-06 DIAGNOSIS — N289 Disorder of kidney and ureter, unspecified: Secondary | ICD-10-CM

## 2012-12-06 DIAGNOSIS — R634 Abnormal weight loss: Secondary | ICD-10-CM

## 2012-12-09 ENCOUNTER — Other Ambulatory Visit (INDEPENDENT_AMBULATORY_CARE_PROVIDER_SITE_OTHER): Payer: Medicare Other

## 2012-12-09 DIAGNOSIS — R634 Abnormal weight loss: Secondary | ICD-10-CM

## 2012-12-09 LAB — COMPREHENSIVE METABOLIC PANEL
ALT: 232 U/L — ABNORMAL HIGH (ref 0–53)
BUN: 19 mg/dL (ref 6–23)
CO2: 30 mEq/L (ref 19–32)
Calcium: 9.2 mg/dL (ref 8.4–10.5)
Chloride: 101 mEq/L (ref 96–112)
Creatinine, Ser: 0.8 mg/dL (ref 0.4–1.5)
GFR: 97.22 mL/min (ref >60.00)
Glucose, Bld: 115 mg/dL — ABNORMAL HIGH (ref 70–99)

## 2012-12-09 LAB — GAMMA GT: GGT: 2052 U/L — ABNORMAL HIGH (ref 7–51)

## 2012-12-10 LAB — MITOCHONDRIAL ANTIBODIES: Mitochondrial M2 Ab, IgG: 0.1 (ref <0.91)

## 2012-12-12 ENCOUNTER — Other Ambulatory Visit: Payer: Medicare Other

## 2012-12-14 ENCOUNTER — Ambulatory Visit
Admission: RE | Admit: 2012-12-14 | Discharge: 2012-12-14 | Disposition: A | Discharge status: Home / Self Care | Payer: Medicare Other | Source: Ambulatory Visit | Attending: Family Medicine | Admitting: Family Medicine

## 2012-12-14 DIAGNOSIS — N289 Disorder of kidney and ureter, unspecified: Secondary | ICD-10-CM

## 2012-12-14 MED ORDER — GADOBENATE DIMEGLUMINE 529 MG/ML IV SOLN
13.0000 mL | Freq: Once | INTRAVENOUS | Status: AC | PRN
  Administered 2012-12-14: 13 mL via INTRAVENOUS

## 2012-12-15 ENCOUNTER — Encounter: Payer: Self-pay | Admitting: Family Medicine

## 2012-12-15 ENCOUNTER — Other Ambulatory Visit: Payer: Self-pay | Admitting: Family Medicine

## 2012-12-15 DIAGNOSIS — K8050 Calculus of bile duct without cholangitis or cholecystitis without obstruction: Secondary | ICD-10-CM

## 2012-12-15 DIAGNOSIS — R634 Abnormal weight loss: Secondary | ICD-10-CM

## 2012-12-15 DIAGNOSIS — D3610 Benign neoplasm of peripheral nerves and autonomic nervous system, unspecified: Secondary | ICD-10-CM

## 2012-12-16 ENCOUNTER — Encounter (HOSPITAL_COMMUNITY): Payer: Self-pay | Admitting: Pharmacy Technician

## 2012-12-16 ENCOUNTER — Encounter: Payer: Self-pay | Admitting: Gastroenterology

## 2012-12-16 ENCOUNTER — Other Ambulatory Visit (INDEPENDENT_AMBULATORY_CARE_PROVIDER_SITE_OTHER): Payer: Medicare Other

## 2012-12-16 ENCOUNTER — Ambulatory Visit (INDEPENDENT_AMBULATORY_CARE_PROVIDER_SITE_OTHER): Payer: Medicare Other | Admitting: Gastroenterology

## 2012-12-16 ENCOUNTER — Encounter (HOSPITAL_COMMUNITY): Payer: Self-pay | Admitting: *Deleted

## 2012-12-16 ENCOUNTER — Telehealth: Payer: Self-pay

## 2012-12-16 VITALS — BP 148/54 | HR 68 | Ht 69.0 in | Wt 139.0 lb

## 2012-12-16 DIAGNOSIS — R7989 Other specified abnormal findings of blood chemistry: Secondary | ICD-10-CM

## 2012-12-16 DIAGNOSIS — R932 Abnormal findings on diagnostic imaging of liver and biliary tract: Secondary | ICD-10-CM

## 2012-12-16 LAB — COMPREHENSIVE METABOLIC PANEL
BUN: 13 mg/dL (ref 6–23)
CO2: 32 mEq/L (ref 19–32)
GFR: 106.12 mL/min (ref >60.00)
Glucose, Bld: 121 mg/dL — ABNORMAL HIGH (ref 70–99)
Sodium: 137 mEq/L (ref 135–145)
Total Bilirubin: 0.7 mg/dL (ref 0.3–1.2)
Total Protein: 7 g/dL (ref 6.0–8.3)

## 2012-12-16 LAB — CBC WITH DIFFERENTIAL/PLATELET
Basophils Relative: 0.5 % (ref 0.0–3.0)
Eosinophils Absolute: 0 10*3/uL (ref 0.0–0.7)
HCT: 36.1 % — ABNORMAL LOW (ref 39.0–52.0)
Hemoglobin: 12.4 g/dL — ABNORMAL LOW (ref 13.0–17.0)
Lymphs Abs: 1 10*3/uL (ref 0.7–4.0)
MCHC: 34.4 g/dL (ref 30.0–36.0)
MCV: 90 fl (ref 78.0–100.0)
Monocytes Absolute: 0.6 10*3/uL (ref 0.1–1.0)
Neutro Abs: 3.5 10*3/uL (ref 1.4–7.7)
Neutrophils Relative %: 67.8 % (ref 43.0–77.0)
RBC: 4.01 Mil/uL — ABNORMAL LOW (ref 4.22–5.81)

## 2012-12-16 MED ORDER — CIPROFLOXACIN HCL 500 MG PO TABS: 500.0000 mg | ORAL_TABLET | Freq: Two times a day (BID) | ORAL | Status: AC

## 2012-12-16 NOTE — Telephone Encounter (Signed)
Message copied by Donata Duff on Wed Dec 16, 2012 10:26 AM ------      Message from: Zacarias Pontes      Created: Wed Dec 16, 2012 10:09 AM       Pt has apt scheduled with Dr Barrie Dunker on 5/28 arrive at 1.20 for a 1.50 apt...lmom for pt`s apt info.Marland KitchenMarland KitchenLiborio Nixon      ----- Message -----         From: Donata Duff, CMA         Sent: 12/16/2012   9:37 AM           To: Zacarias Pontes            Pt needs appt to consider elective gallbladder removal       ------

## 2012-12-16 NOTE — Telephone Encounter (Signed)
Justin Benson to notify pt  

## 2012-12-16 NOTE — Progress Notes (Signed)
Review of pertinent gastrointestinal problems: 1. Adenomatous polyp 11/2008 colonsocopy; recall at 5 years   HPI: This is a   very pleasant 77-year-old man whom I am seeing for the first time in several years.  Has been losing some weight.  Slowly.  Probably 8-10 pounds down, can tell his pants fit looser.  He had liver tests checked by his primary care physician and they were elevated. This led to an ultrasound which showed gallstones in his gallbladder as well as a potential renal lesion. That led to MRI with MRCP images. The impression from that MRI is below.  No real abd pains.  Feels like he is eating normally.  He had a chill 1-2 weeks ago, shaking chill when it was pretty warm out. Sweating.  Better by the morning.    IMPRESSION from MRI this month:  1. Benign Bosniak I renal cysts of the left kidney. No renal  abnormality identified to correspond to the small lesion described  on ultrasound.  2. Small lesion within the right hepatic lobe cannot be fully  characterized but favor a small benign hemangioma.  3. Filling defect within the distal common bile duct measuring 4  mm which likely represents a common bile duct stone. Recommend  correlation with bilirubin for biliary obstruction. There is  minimal intrahepatic ductal dilatation.  4. 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 g. schwannoma).  Recommend non emergent neurosurgical consultation .  lfts elevated (t bili normal) reviewed   Review of systems: Pertinent positive and negative review of systems were noted in the above HPI section. Complete review of systems was performed and was otherwise normal.    Past Medical History  Diagnosis Date  . Diabetes mellitus 4/02  . Hypertension 1996  . COPD (chronic obstructive pulmonary disease)   . Depression   . Hyperlipidemia   . BPH (benign prostatic hypertrophy)   . Pneumonia as a baby  . AAA  (abdominal aortic aneurysm) 05/2012    mild 2.3 cm , rpt 1 year    Past Surgical History  Procedure Laterality Date  . Circumcision  10/30/09    Dr Tannenbaum  . Colonoscopy  11/2008    2 small polyps (1 tubular), diverticulosis, lipoma, int/ext hemorrhoids ([facility])    Current Outpatient Prescriptions  Medication Sig Dispense Refill  . amLODipine (NORVASC) 10 MG tablet Take 1 tablet (10 mg total) by mouth daily.  90 tablet  3  . aspirin 81 MG tablet Take 81 mg by mouth daily.        . atorvastatin (LIPITOR) 10 MG tablet Take 1 tablet (10 mg total) by mouth at bedtime.  90 tablet  3  . finasteride (PROSCAR) 5 MG tablet Take 1 tablet (5 mg total) by mouth daily.  90 tablet  3  . metFORMIN (GLUCOPHAGE) 500 MG tablet Take 1 tablet (500 mg total) by mouth as directed. One in am and two at night  270 tablet  3  . Multiple Vitamins-Minerals (HCA MULTIVITAMIN/MINERALS PO) Take 1 capsule by mouth daily.        . quinapril (ACCUPRIL) 40 MG tablet Take 1 tablet (40 mg total) by mouth at bedtime.  90 tablet  3  . quinapril-hydrochlorothiazide (ACCURETIC) 20-25 MG per tablet Take 1 tablet by mouth daily.  90 tablet  3  . sertraline (ZOLOFT) 100 MG tablet Take 1.5 tablets (150 mg total) by mouth daily.  135 tablet  3     No current facility-administered medications for this visit.    Allergies as of 12/16/2012  . (No Known Allergies)    Family History  Problem Relation Age of Onset  . Prostate cancer Father   . Aneurysm Father     aortic  . Stroke Father   . Hodgkin's lymphoma Brother 20  . Prostate cancer Brother 50    1/2  . Prostate cancer Brother 50    1/2  . Other Sister     cerebral hemm  . Lupus Sister   . Colon cancer Neg Hx   . Diabetes Brother     History   Social History  . Marital Status: Divorced    Spouse Name: N/A    Number of Children: 1  . Years of Education: N/A   Occupational History  . [employer] social worker     retired 1999   Social History Main Topics  .  Smoking status: Former Smoker -- 2.00 packs/day for 40 years    Types: Cigarettes    Quit date: 07/29/1992  . Smokeless tobacco: Never Used  . Alcohol Use: Yes     Comment: Regular (2 days/week)  . Drug Use: No  . Sexually Active: Yes   Other Topics Concern  . Not on file   Social History Narrative   Divorced since 1979   Lives alone   1 daughter 22, widowed       Physical Exam: BP 148/54  Pulse 68  Ht 5' 9" (1.753 m)  Wt 139 lb (63.05 kg)  BMI 20.52 kg/m2 Constitutional: generally well-appearing Psychiatric: alert and oriented x3 Eyes: extraocular movements intact Mouth: oral pharynx moist, no lesions Neck: supple no lymphadenopathy Cardiovascular: heart regular rate and rhythm Lungs: clear to auscultation bilaterally Abdomen: soft, nontender, nondistended, no obvious ascites, no peritoneal signs, normal bowel sounds Extremities: no lower extremity edema bilaterally Skin: no lesions on visible extremities    Assessment and plan: 77 y.o. male with  Elevated liver tests, likely stone and bile duct, cholelithiasis  Heat ERCP to confirm and remove this stone is bile duct. He will need elective gallbladder resection as well. He did have what sounds like rigors, chills 1-2 weeks ago and since this is probably related to the bile duct stone and going to put him on antibiotics until the time of his ERCP which will be done next week.     

## 2012-12-16 NOTE — Patient Instructions (Addendum)
You will have labs checked today in the basement lab.  Please head down after you check out with the front desk  (cbc, cmet). ERCP with MAC sedation next week for CBD stone, elevated liver tests. Start cipro one pill twice daily for now. General surgery referral to consider elective GB removal.                                               We are excited to introduce MyChart, a new best-in-class service that provides you online access to important information in your electronic medical record. We want to make it easier for you to view your health information - all in one secure location - when and where you need it. We expect MyChart will enhance the quality of care and service we provide.  When you register for MyChart, you can:    View your test results.    Request appointments and receive appointment reminders via email.    Request medication renewals.    View your medical history, allergies, medications and immunizations.    Communicate with your physician's office through a password-protected site.    Conveniently print information such as your medication lists.  To find out if MyChart is right for you, please talk to a member of our clinical staff today. We will gladly answer your questions about this free health and wellness tool.  If you are age 77 or older and want a member of your family to have access to your record, you must provide written consent by completing a proxy form available at our office. Please speak to our clinical staff about guidelines regarding accounts for patients younger than age 55.  As you activate your MyChart account and need any technical assistance, please call the MyChart technical support line at [phone]) or email your question to [email or web address]. If you email your question(s), please include your name, a return phone number and the best time to reach you.  If you have non-urgent health-related questions, you can send  a message to our office through MyChart at [email or web address]. If you have a medical emergency, call 911.  Thank you for using MyChart as your new health and wellness resource!   MyChart licensed from [employer],  [id number]. Patents Pending.

## 2012-12-23 ENCOUNTER — Encounter (INDEPENDENT_AMBULATORY_CARE_PROVIDER_SITE_OTHER): Payer: Self-pay | Admitting: Surgery

## 2012-12-23 ENCOUNTER — Ambulatory Visit (INDEPENDENT_AMBULATORY_CARE_PROVIDER_SITE_OTHER): Payer: Medicare Other | Admitting: Surgery

## 2012-12-23 VITALS — BP 138/86 | HR 72 | Temp 97.7°F | Resp 12 | Ht 69.0 in | Wt 139.2 lb

## 2012-12-23 DIAGNOSIS — K8020 Calculus of gallbladder without cholecystitis without obstruction: Secondary | ICD-10-CM | POA: Insufficient documentation

## 2012-12-23 NOTE — Progress Notes (Signed)
Patient ID: Justin Benson, male   DOB: 09/16/1934, 78 y.o.   MRN: [id number]  Chief Complaint  Patient presents with  . Abdominal Pain    HPI Justin Benson is a 78 y.o. male.   HPIThis is a very pleasant gentleman referred by Dr. Christella Hartigan for evaluation of symptomatic cholelithiasis.  He was actually recently found on physical examination to have elevated liver function tests. On further review he has had intermittent fevers and chills as well as nausea and vomiting but no pain. He was found ultrasound to have gallstones. Recent MR CP see just a stone in the distal bile duct. He is scheduled for an ERCP tomorrow. Bowel  Movements are normal. He is otherwise without complaints.  Past Medical History  Diagnosis Date  . Diabetes mellitus 4/02  . Hypertension 1996  . COPD (chronic obstructive pulmonary disease)   . Depression   . Hyperlipidemia   . BPH (benign prostatic hypertrophy)   . Pneumonia as a baby  . AAA (abdominal aortic aneurysm) 05/2012    mild 2.3 cm , rpt 1 year  . GERD (gastroesophageal reflux disease)   . Arthritis     fingers and hip    Past Surgical History  Procedure Laterality Date  . Circumcision  10/30/09    Dr Patsi Sears  . Colonoscopy  11/2008    2 small polyps (1 tubular), diverticulosis, lipoma, int/ext hemorrhoids Christella Hartigan)    Family History  Problem Relation Age of Onset  . Prostate cancer Father   . Aneurysm Father     aortic  . Stroke Father   . Hodgkin's lymphoma Brother 20  . Prostate cancer Brother 52    1/2  . Prostate cancer Brother 45    1/2  . Other Sister     cerebral hemm  . Lupus Sister   . Colon cancer Neg Hx   . Diabetes Brother     Social History History  Substance Use Topics  . Smoking status: Former Smoker -- 2.00 packs/day for 40 years    Types: Cigarettes    Quit date: 07/29/1992  . Smokeless tobacco: Never Used  . Alcohol Use: Yes     Comment: Regular (2 days/week)    No Known Allergies  Current Outpatient  Prescriptions  Medication Sig Dispense Refill  . amLODipine (NORVASC) 10 MG tablet Take 10 mg by mouth daily before breakfast.      . aspirin 81 MG tablet Take 81 mg by mouth daily.        Marland Kitchen atorvastatin (LIPITOR) 10 MG tablet Take 10 mg by mouth every evening.      . ciprofloxacin (CIPRO) 500 MG tablet Take 1 tablet (500 mg total) by mouth 2 (two) times daily.  20 tablet  0  . finasteride (PROSCAR) 5 MG tablet Take 5 mg by mouth every evening.      . metFORMIN (GLUCOPHAGE) 500 MG tablet Take 500-1,000 mg by mouth 2 (two) times daily with a meal. Takes 1 tablet in the morning and 2 at night      . Multiple Vitamin (MULTIVITAMIN WITH MINERALS) TABS Take 1 tablet by mouth daily.      Marland Kitchen omeprazole (PRILOSEC) 40 MG capsule Take 40 mg by mouth 2 (two) times daily.      . quinapril (ACCUPRIL) 40 MG tablet Take 40 mg by mouth at bedtime.      . quinapril-hydrochlorothiazide (ACCURETIC) 20-25 MG per tablet Take 1 tablet by mouth every evening.      Marland Kitchen  sertraline (ZOLOFT) 100 MG tablet Take 150 mg by mouth daily. Takes 1 and 1/2       No current facility-administered medications for this visit.    Review of Systems Review of Systems  Constitutional: Positive for fever, chills and unexpected weight change.  HENT: Negative for hearing loss, congestion, sore throat, trouble swallowing and voice change.   Eyes: Negative for visual disturbance.  Respiratory: Negative for cough and wheezing.   Cardiovascular: Negative for chest pain, palpitations and leg swelling.  Gastrointestinal: Positive for nausea and vomiting. Negative for abdominal pain, diarrhea, constipation, blood in stool, abdominal distention, anal bleeding and rectal pain.  Genitourinary: Negative for hematuria and difficulty urinating.  Musculoskeletal: Negative for arthralgias.  Skin: Negative for rash and wound.  Neurological: Negative for seizures, syncope, weakness and headaches.  Hematological: Negative for adenopathy. Does not  bruise/bleed easily.  Psychiatric/Behavioral: Negative for confusion.    Blood pressure 138/86, pulse 72, temperature 97.7 F (36.5 C), temperature source Temporal, resp. rate 12, height 5\' 9"  (1.753 m), weight 139 lb 3.2 oz (63.141 kg).  Physical Exam Physical Exam  Constitutional: He is oriented to person, place, and time. He appears well-developed and well-nourished. No distress.  HENT:  Head: Normocephalic and atraumatic.  Right Ear: External ear normal.  Left Ear: External ear normal.  Nose: Nose normal.  Mouth/Throat: Oropharynx is clear and moist. No oropharyngeal exudate.  Eyes: Conjunctivae are normal. Pupils are equal, round, and reactive to light. Right eye exhibits no discharge. Left eye exhibits no discharge. No scleral icterus.  Neck: Normal range of motion. Neck supple. No tracheal deviation present. No thyromegaly present.  Cardiovascular: Normal rate, regular rhythm, normal heart sounds and intact distal pulses.   No murmur heard. Pulmonary/Chest: Effort normal and breath sounds normal. No respiratory distress. He has no wheezes.  Abdominal: Soft. Bowel sounds are normal. He exhibits no distension and no mass. There is no tenderness. There is no guarding.  Musculoskeletal: Normal range of motion. He exhibits no edema and no tenderness.  Lymphadenopathy:    He has no cervical adenopathy.  Neurological: He is alert and oriented to person, place, and time.  Skin: Skin is warm and dry. No rash noted. He is not diaphoretic. No erythema.  Psychiatric: His behavior is normal.    Data Reviewed I have reviewed the notes from Dr. Christella Hartigan as well as his laboratory data, ultrasound, and MRCP  Assessment    Symptomatic cholelithiasis and possible choledocholithiasis     Plan    I agree with Dr. Christella Hartigan that the patient needs an eventual  Laparoscopic cholecystectomy.  I told him that even with the ERCP that he could still have symptomatic gallstones. I discussed the surgical  procedure in detail and gave him literature regarding the surgery. I discussed the risks of surgery which includes but is not limited to bleeding, infection, bile duct injury, bile leak, injury to surround structures, need to convert to an open procedure, et Karie Soda. He understands and wishes to proceed. Surgery will be scheduled        Nakiya Rallis A 12/23/2012, 1:46 PM

## 2012-12-24 ENCOUNTER — Encounter (HOSPITAL_COMMUNITY)
Admission: RE | Disposition: A | Discharge status: Home / Self Care | Payer: Self-pay | Source: Ambulatory Visit | Attending: Gastroenterology

## 2012-12-24 ENCOUNTER — Encounter (HOSPITAL_COMMUNITY): Payer: Self-pay | Admitting: *Deleted

## 2012-12-24 ENCOUNTER — Ambulatory Visit (HOSPITAL_COMMUNITY)
Admission: RE | Admit: 2012-12-24 | Discharge: 2012-12-24 | Disposition: A | Discharge status: Home / Self Care | Payer: Medicare Other | Source: Ambulatory Visit | Attending: Gastroenterology | Admitting: Gastroenterology

## 2012-12-24 ENCOUNTER — Encounter (HOSPITAL_COMMUNITY): Payer: Self-pay | Admitting: Registered Nurse

## 2012-12-24 ENCOUNTER — Ambulatory Visit (HOSPITAL_COMMUNITY): Payer: Medicare Other | Admitting: Registered Nurse

## 2012-12-24 ENCOUNTER — Ambulatory Visit (HOSPITAL_COMMUNITY): Payer: Medicare Other

## 2012-12-24 DIAGNOSIS — E119 Type 2 diabetes mellitus without complications: Secondary | ICD-10-CM | POA: Insufficient documentation

## 2012-12-24 DIAGNOSIS — Z7982 Long term (current) use of aspirin: Secondary | ICD-10-CM | POA: Insufficient documentation

## 2012-12-24 DIAGNOSIS — R7989 Other specified abnormal findings of blood chemistry: Secondary | ICD-10-CM

## 2012-12-24 DIAGNOSIS — R932 Abnormal findings on diagnostic imaging of liver and biliary tract: Secondary | ICD-10-CM

## 2012-12-24 DIAGNOSIS — J449 Chronic obstructive pulmonary disease, unspecified: Secondary | ICD-10-CM | POA: Insufficient documentation

## 2012-12-24 DIAGNOSIS — F3289 Other specified depressive episodes: Secondary | ICD-10-CM | POA: Insufficient documentation

## 2012-12-24 DIAGNOSIS — F329 Major depressive disorder, single episode, unspecified: Secondary | ICD-10-CM | POA: Insufficient documentation

## 2012-12-24 DIAGNOSIS — K8050 Calculus of bile duct without cholangitis or cholecystitis without obstruction: Secondary | ICD-10-CM

## 2012-12-24 DIAGNOSIS — I10 Essential (primary) hypertension: Secondary | ICD-10-CM | POA: Insufficient documentation

## 2012-12-24 DIAGNOSIS — I739 Peripheral vascular disease, unspecified: Secondary | ICD-10-CM | POA: Insufficient documentation

## 2012-12-24 DIAGNOSIS — K219 Gastro-esophageal reflux disease without esophagitis: Secondary | ICD-10-CM | POA: Insufficient documentation

## 2012-12-24 DIAGNOSIS — K8070 Calculus of gallbladder and bile duct without cholecystitis without obstruction: Secondary | ICD-10-CM | POA: Insufficient documentation

## 2012-12-24 DIAGNOSIS — J4489 Other specified chronic obstructive pulmonary disease: Secondary | ICD-10-CM | POA: Insufficient documentation

## 2012-12-24 DIAGNOSIS — E785 Hyperlipidemia, unspecified: Secondary | ICD-10-CM | POA: Insufficient documentation

## 2012-12-24 DIAGNOSIS — Z79899 Other long term (current) drug therapy: Secondary | ICD-10-CM | POA: Insufficient documentation

## 2012-12-24 HISTORY — DX: Unspecified osteoarthritis, unspecified site: M19.90

## 2012-12-24 HISTORY — PX: ENDOSCOPIC RETROGRADE CHOLANGIOPANCREATOGRAPHY (ERCP) WITH PROPOFOL: SHX5810

## 2012-12-24 HISTORY — DX: Gastro-esophageal reflux disease without esophagitis: K21.9

## 2012-12-24 LAB — GLUCOSE, CAPILLARY: Glucose-Capillary: 112 mg/dL — ABNORMAL HIGH (ref 70–99)

## 2012-12-24 SURGERY — ENDOSCOPIC RETROGRADE CHOLANGIOPANCREATOGRAPHY (ERCP) WITH PROPOFOL
Anesthesia: General

## 2012-12-24 MED ORDER — LIDOCAINE HCL 4 % MT SOLN
OROMUCOSAL | Status: DC | PRN
  Administered 2012-12-24: 4 mL via TOPICAL

## 2012-12-24 MED ORDER — CEFAZOLIN SODIUM-DEXTROSE 2-3 GM-% IV SOLR: 2.0000 g | INTRAVENOUS | Status: DC

## 2012-12-24 MED ORDER — EPHEDRINE SULFATE 50 MG/ML IJ SOLN
INTRAMUSCULAR | Status: DC | PRN
  Administered 2012-12-24: 10 mg via INTRAVENOUS

## 2012-12-24 MED ORDER — SODIUM CHLORIDE 0.9 % IV SOLN: INTRAVENOUS | Status: DC

## 2012-12-24 MED ORDER — LACTATED RINGERS IV SOLN
INTRAVENOUS | Status: DC
  Administered 2012-12-24: 1000 mL via INTRAVENOUS

## 2012-12-24 MED ORDER — LIDOCAINE HCL (CARDIAC) 20 MG/ML IV SOLN
INTRAVENOUS | Status: DC | PRN
  Administered 2012-12-24: 50 mg via INTRAVENOUS

## 2012-12-24 MED ORDER — ONDANSETRON HCL 4 MG/2ML IJ SOLN
INTRAMUSCULAR | Status: DC | PRN
  Administered 2012-12-24: 4 mg via INTRAVENOUS

## 2012-12-24 MED ORDER — SODIUM CHLORIDE 0.9 % IV SOLN
INTRAVENOUS | Status: DC | PRN
  Administered 2012-12-24: 10:00:00

## 2012-12-24 MED ORDER — CIPROFLOXACIN IN D5W 400 MG/200ML IV SOLN
INTRAVENOUS | Status: DC | PRN
  Administered 2012-12-24: 400 mg via INTRAVENOUS

## 2012-12-24 MED ORDER — PHENYLEPHRINE HCL 10 MG/ML IJ SOLN
INTRAMUSCULAR | Status: DC | PRN
  Administered 2012-12-24: 80 ug via INTRAVENOUS

## 2012-12-24 MED ORDER — PROPOFOL 10 MG/ML IV BOLUS
INTRAVENOUS | Status: DC | PRN
  Administered 2012-12-24: 160 mg via INTRAVENOUS

## 2012-12-24 MED ORDER — MIDAZOLAM HCL 5 MG/5ML IJ SOLN
INTRAMUSCULAR | Status: DC | PRN
  Administered 2012-12-24: 1 mg via INTRAVENOUS

## 2012-12-24 MED ORDER — CIPROFLOXACIN IN D5W 400 MG/200ML IV SOLN
INTRAVENOUS | Status: AC
  Filled 2012-12-24: qty 200

## 2012-12-24 MED ORDER — ROCURONIUM BROMIDE 100 MG/10ML IV SOLN
INTRAVENOUS | Status: DC | PRN
  Administered 2012-12-24: 5 mg via INTRAVENOUS

## 2012-12-24 MED ORDER — FENTANYL CITRATE 0.05 MG/ML IJ SOLN
INTRAMUSCULAR | Status: DC | PRN
  Administered 2012-12-24: 50 ug via INTRAVENOUS
  Administered 2012-12-24: 25 ug via INTRAVENOUS

## 2012-12-24 MED ORDER — SUCCINYLCHOLINE CHLORIDE 20 MG/ML IJ SOLN
INTRAMUSCULAR | Status: DC | PRN
  Administered 2012-12-24: 100 mg via INTRAVENOUS

## 2012-12-24 NOTE — Interval H&P Note (Signed)
History and Physical Interval Note:  12/24/2012 8:51 AM  Justin Benson  has presented today for surgery, with the diagnosis of Elevated liver function tests [790.6] Gallstones [574.20]  The various methods of treatment have been discussed with the patient and family. After consideration of risks, benefits and other options for treatment, the patient has consented to  Procedure(s): ENDOSCOPIC RETROGRADE CHOLANGIOPANCREATOGRAPHY (ERCP) WITH PROPOFOL (N/A) as a surgical intervention .  The patient's history has been reviewed, patient examined, no change in status, stable for surgery.  I have reviewed the patient's chart and labs.  Questions were answered to the patient's satisfaction.     Daniel Jacobs   

## 2012-12-24 NOTE — Interval H&P Note (Signed)
History and Physical Interval Note:  12/24/2012 8:51 AM  Kennon Rounds  has presented today for surgery, with the diagnosis of Elevated liver function tests [790.6] Gallstones [574.20]  The various methods of treatment have been discussed with the patient and family. After consideration of risks, benefits and other options for treatment, the patient has consented to  Procedure(s): ENDOSCOPIC RETROGRADE CHOLANGIOPANCREATOGRAPHY (ERCP) WITH PROPOFOL (N/A) as a surgical intervention .  The patient's history has been reviewed, patient examined, no change in status, stable for surgery.  I have reviewed the patient's chart and labs.  Questions were answered to the patient's satisfaction.     Rob Bunting

## 2012-12-24 NOTE — Anesthesia Postprocedure Evaluation (Signed)
Anesthesia Post Note  Patient: Justin Benson  Procedure(s) Performed: Procedure(s) (LRB): ENDOSCOPIC RETROGRADE CHOLANGIOPANCREATOGRAPHY (ERCP) WITH PROPOFOL (N/A)  Anesthesia type: MAC  Patient location: PACU  Post pain: Pain level controlled  Post assessment: Post-op Vital signs reviewed  Last Vitals:  Filed Vitals:   12/24/12 1100  BP: 150/74  Temp:   Resp: 14    Post vital signs: Reviewed  Level of consciousness: sedated  Complications: No apparent anesthesia complications

## 2012-12-24 NOTE — Transfer of Care (Signed)
Immediate Anesthesia Transfer of Care Note  Patient: Justin Benson  Procedure(s) Performed: Procedure(s): ENDOSCOPIC RETROGRADE CHOLANGIOPANCREATOGRAPHY (ERCP) WITH PROPOFOL (N/A)  Patient Location: PACU  Anesthesia Type:General  Level of Consciousness: awake, alert , oriented and patient cooperative  Airway & Oxygen Therapy: Patient Spontanous Breathing and Patient connected to nasal cannula oxygen  Post-op Assessment: Report given to PACU RN, Post -op Vital signs reviewed and stable and Patient moving all extremities X 4  Post vital signs: stable  Complications: No apparent anesthesia complications

## 2012-12-24 NOTE — H&P (View-Only) (Signed)
Review of pertinent gastrointestinal problems: 1. Adenomatous polyp 11/2008 colonsocopy; recall at 5 years   HPI: This is a   very pleasant 78 year old man whom I am seeing for the first time in several years.  Has been losing some weight.  Slowly.  Probably 8-10 pounds down, can tell his pants fit looser.  He had liver tests checked by his primary care physician and they were elevated. This led to an ultrasound which showed gallstones in his gallbladder as well as a potential renal lesion. That led to MRI with MRCP images. The impression from that MRI is below.  No real abd pains.  Feels like he is eating normally.  He had a chill 1-2 weeks ago, shaking chill when it was pretty warm out. Sweating.  Better by the morning.    IMPRESSION from MRI this month:  1. Benign Bosniak I renal cysts of the left kidney. No renal  abnormality identified to correspond to the small lesion described  on ultrasound.  2. Small lesion within the right hepatic lobe cannot be fully  characterized but favor a small benign hemangioma.  3. Filling defect within the distal common bile duct measuring 4  mm which likely represents a common bile duct stone. Recommend  correlation with bilirubin for biliary obstruction. There is  minimal intrahepatic ductal dilatation.  4. 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 g. schwannoma).  Recommend non emergent neurosurgical consultation .  lfts elevated (t bili normal) reviewed   Review of systems: Pertinent positive and negative review of systems were noted in the above HPI section. Complete review of systems was performed and was otherwise normal.    Past Medical History  Diagnosis Date  . Diabetes mellitus 4/02  . Hypertension 1996  . COPD (chronic obstructive pulmonary disease)   . Depression   . Hyperlipidemia   . BPH (benign prostatic hypertrophy)   . Pneumonia as a baby  . AAA  (abdominal aortic aneurysm) 05/2012    mild 2.3 cm , rpt 1 year    Past Surgical History  Procedure Laterality Date  . Circumcision  10/30/09    Dr Patsi Sears  . Colonoscopy  11/2008    2 small polyps (1 tubular), diverticulosis, lipoma, int/ext hemorrhoids Christella Hartigan)    Current Outpatient Prescriptions  Medication Sig Dispense Refill  . amLODipine (NORVASC) 10 MG tablet Take 1 tablet (10 mg total) by mouth daily.  90 tablet  3  . aspirin 81 MG tablet Take 81 mg by mouth daily.        Marland Kitchen atorvastatin (LIPITOR) 10 MG tablet Take 1 tablet (10 mg total) by mouth at bedtime.  90 tablet  3  . finasteride (PROSCAR) 5 MG tablet Take 1 tablet (5 mg total) by mouth daily.  90 tablet  3  . metFORMIN (GLUCOPHAGE) 500 MG tablet Take 1 tablet (500 mg total) by mouth as directed. One in am and two at night  270 tablet  3  . Multiple Vitamins-Minerals (HCA MULTIVITAMIN/MINERALS PO) Take 1 capsule by mouth daily.        . quinapril (ACCUPRIL) 40 MG tablet Take 1 tablet (40 mg total) by mouth at bedtime.  90 tablet  3  . quinapril-hydrochlorothiazide (ACCURETIC) 20-25 MG per tablet Take 1 tablet by mouth daily.  90 tablet  3  . sertraline (ZOLOFT) 100 MG tablet Take 1.5 tablets (150 mg total) by mouth daily.  135 tablet  3  No current facility-administered medications for this visit.    Allergies as of 12/16/2012  . (No Known Allergies)    Family History  Problem Relation Age of Onset  . Prostate cancer Father   . Aneurysm Father     aortic  . Stroke Father   . Hodgkin's lymphoma Brother 20  . Prostate cancer Brother 31    1/2  . Prostate cancer Brother 24    1/2  . Other Sister     cerebral hemm  . Lupus Sister   . Colon cancer Neg Hx   . Diabetes Brother     History   Social History  . Marital Status: Divorced    Spouse Name: N/A    Number of Children: 1  . Years of Education: N/A   Occupational History  . [employer] psychotherapist     retired 1999   Social History Main Topics  .  Smoking status: Former Smoker -- 2.00 packs/day for 40 years    Types: Cigarettes    Quit date: 07/29/1992  . Smokeless tobacco: Never Used  . Alcohol Use: Yes     Comment: Regular (2 days/week)  . Drug Use: No  . Sexually Active: Yes   Other Topics Concern  . Not on file   Social History Narrative   Divorced since 1979   Lives alone   1 daughter 32, widowed       Physical Exam: BP 148/54  Pulse 68  Ht 5\' 9"  (1.753 m)  Wt 139 lb (63.05 kg)  BMI 20.52 kg/m2 Constitutional: generally well-appearing Psychiatric: alert and oriented x3 Eyes: extraocular movements intact Mouth: oral pharynx moist, no lesions Neck: supple no lymphadenopathy Cardiovascular: heart regular rate and rhythm Lungs: clear to auscultation bilaterally Abdomen: soft, nontender, nondistended, no obvious ascites, no peritoneal signs, normal bowel sounds Extremities: no lower extremity edema bilaterally Skin: no lesions on visible extremities    Assessment and plan: 78 y.o. male with  Elevated liver tests, likely stone and bile duct, cholelithiasis  Heat ERCP to confirm and remove this stone is bile duct. He will need elective gallbladder resection as well. He did have what sounds like rigors, chills 1-2 weeks ago and since this is probably related to the bile duct stone and going to put him on antibiotics until the time of his ERCP which will be done next week.

## 2012-12-24 NOTE — Op Note (Signed)
[facility] [address] [place], [address]   ERCP PROCEDURE REPORT  PATIENT: Justin Benson, Justin Benson.  MR# :[id number] BIRTHDATE: Dec 16, 1934  GENDER: Male ENDOSCOPIST: Rachael Fee, MD REFERRED BY: Eustaquio Boyden, M.D. PROCEDURE DATE:  12/24/2012 PROCEDURE:   ERCP with sphincterotomy/papillotomy ASA CLASS:   Class III INDICATIONS:recent imaging ([place] and MRI) shows stones in GB and small stone in CBD. MEDICATIONS: General endotracheal anesthesia (GETA) and Cipro 400 mg IV TOPICAL ANESTHETIC: none  DESCRIPTION OF PROCEDURE:   After the risks benefits and alternatives of the procedure were thoroughly explained, informed consent was obtained.  The [id number]  endoscope was introduced through the mouth  and advanced to the second portion of the duodenum  without detailed examination of the UGI tract. The major papilla was somewhat flat.  A 44 Autotome over a .035 hydrawire was used to cannulate the CBD and constract was injected. Cholangiogram revealved non-dilated CBD (6mm) without clear filling defects. The CHD, right and left hepatic ducts were normal. Cystic duct partially opacified.  Given MRCP images which showed small CBD stone I elected to proceed with small biliary sphincterotomy and then balloon sweeping of the bile duct. No stones or stone debris were noted. The main pancreatic duct was never cannulated with wire or injected with dye.  The scope was then completely withdrawn from the patient and the procedure terminated.     COMPLICATIONS: None  ENDOSCOPIC IMPRESSION: Normal extrahepatic biliary tree.  Given MRCP images which showed CBD stone I performed small biliary sphincterotomy and balloon sweeping of the bile duct.  RECOMMENDATIONS: He is already scheduled for elective cholecystectomy in next few weeks with Dr. Rayburn Ma and knows to call my office sooner with any questions or concerns.   _______________________________ eSignedRachael Fee, MD 12/24/2012 9:51 AM   XB:JYNW Rayburn Ma, MD

## 2012-12-24 NOTE — Anesthesia Preprocedure Evaluation (Addendum)
Anesthesia Evaluation  Patient identified by MRN, date of birth, ID band Patient awake    Reviewed: Allergy & Precautions, H&P , NPO status , Patient's Chart, lab work & pertinent test results  Airway Mallampati: II TM Distance: >3 FB Neck ROM: Full    Dental  (+) Teeth Intact and Dental Advisory Given   Pulmonary pneumonia -, COPDformer smoker,  breath sounds clear to auscultation  Pulmonary exam normal       Cardiovascular hypertension, + Peripheral Vascular Disease (AAA 2.3cm) Rhythm:Regular Rate:Normal     Neuro/Psych Depression negative neurological ROS     GI/Hepatic Neg liver ROS, GERD-  Medicated,(+)     substance abuse (discontinued 06/1999)  alcohol use,   Endo/Other  diabetes, Type 2, Oral Hypoglycemic Agents  Renal/GU negative Renal ROS  negative genitourinary   Musculoskeletal negative musculoskeletal ROS (+)   Abdominal   Peds  Hematology negative hematology ROS (+)   Anesthesia Other Findings   Reproductive/Obstetrics                          Anesthesia Physical Anesthesia Plan  ASA: III  Anesthesia Plan: General   Post-op Pain Management:    Induction: Intravenous  Airway Management Planned: Oral ETT  Additional Equipment:   Intra-op Plan:   Post-operative Plan: Extubation in OR  Informed Consent: I have reviewed the patients History and Physical, chart, labs and discussed the procedure including the risks, benefits and alternatives for the proposed anesthesia with the patient or authorized representative who has indicated his/her understanding and acceptance.   Dental advisory given  Plan Discussed with: CRNA  Anesthesia Plan Comments:         Anesthesia Quick Evaluation

## 2012-12-28 ENCOUNTER — Encounter (HOSPITAL_COMMUNITY): Payer: Self-pay | Admitting: Gastroenterology

## 2012-12-30 ENCOUNTER — Telehealth: Payer: Self-pay | Admitting: Family Medicine

## 2012-12-30 DIAGNOSIS — K8050 Calculus of bile duct without cholangitis or cholecystitis without obstruction: Secondary | ICD-10-CM

## 2012-12-30 NOTE — Telephone Encounter (Signed)
Patient would like to have a liver function test done before 01/11/13 appointment.

## 2012-12-30 NOTE — Telephone Encounter (Signed)
plz have him come in 1 wk prior to next appt for liver function test.

## 2012-12-31 NOTE — Telephone Encounter (Signed)
Lab appt scheduled for 01/04/13

## 2013-01-04 ENCOUNTER — Other Ambulatory Visit (INDEPENDENT_AMBULATORY_CARE_PROVIDER_SITE_OTHER): Payer: Medicare Other

## 2013-01-04 DIAGNOSIS — K8050 Calculus of bile duct without cholangitis or cholecystitis without obstruction: Secondary | ICD-10-CM

## 2013-01-04 LAB — HEPATIC FUNCTION PANEL
AST: 31 U/L (ref 0–37)
Albumin: 4.1 g/dL (ref 3.5–5.2)
Total Bilirubin: 0.7 mg/dL (ref 0.3–1.2)

## 2013-01-11 ENCOUNTER — Encounter: Payer: Self-pay | Admitting: Family Medicine

## 2013-01-11 ENCOUNTER — Ambulatory Visit (INDEPENDENT_AMBULATORY_CARE_PROVIDER_SITE_OTHER): Payer: Medicare Other | Admitting: Family Medicine

## 2013-01-11 VITALS — BP 134/72 | HR 68 | Temp 97.8°F | Wt 137.2 lb

## 2013-01-11 DIAGNOSIS — R634 Abnormal weight loss: Secondary | ICD-10-CM | POA: Insufficient documentation

## 2013-01-11 DIAGNOSIS — K8050 Calculus of bile duct without cholangitis or cholecystitis without obstruction: Secondary | ICD-10-CM

## 2013-01-11 DIAGNOSIS — D334 Benign neoplasm of spinal cord: Secondary | ICD-10-CM

## 2013-01-11 DIAGNOSIS — I10 Essential (primary) hypertension: Secondary | ICD-10-CM

## 2013-01-11 MED ORDER — FINASTERIDE 5 MG PO TABS: 5.0000 mg | ORAL_TABLET | Freq: Every evening | ORAL | Status: DC

## 2013-01-11 NOTE — Assessment & Plan Note (Signed)
S/p ERCP, no stone found but sphincterotomy performed.  LFTs trending back to normal. Discussed lap chole with patient- will consider, was leaning towards not undergoing 2/2 asxs however aware of risk of recurrence and infection.

## 2013-01-11 NOTE — Assessment & Plan Note (Signed)
Slowing down. Aware to monitor at home and return sooner if persistent. Prior presumed 2/2 choledocholithiasis, which has resolved now.

## 2013-01-11 NOTE — Assessment & Plan Note (Signed)
F/u with neurosurg planned.

## 2013-01-11 NOTE — Patient Instructions (Signed)
Good to see you today, call us with questions. Return to see me in 6 months for medicare wellness visit, prior fasting for blood work. Return sooner if weight loss continued.

## 2013-01-11 NOTE — Progress Notes (Signed)
  Subjective:    Patient ID: Justin Benson, male    DOB: September 24, 1934, 78 y.o.   MRN: [id number]  HPI CC: 1 mo f/u  Hoarseness - improved with prilosec.  However has decided not to take.  Choledocholithiasis - had ERCP done by Dr. Christella Hartigan.  Recommended cholecystectomy, but pt has decided to postpone for now.  Aware of risks of not having surgery including infection and re obstruction.  Schwanomma - has seen neurosurgeon - planning on US guided biopsy.  May not need surgery.  Continues to lose weight - down 1 lb.  Lives alone.  No appetite.  Eats 3 meals a day however.  DM - on metformin 500mg  in am and 1000mg  at night.  No low sugars noted. Lab Results  Component Value Date   HGBA1C 7.2* 11/30/2012   Wt Readings from Last 3 Encounters:  01/11/13 137 lb 4 oz (62.256 kg)  12/16/12 139 lb (63.05 kg)  12/16/12 139 lb (63.05 kg)    Past Medical History  Diagnosis Date  . Diabetes mellitus 4/02  . Hypertension 1996  . COPD (chronic obstructive pulmonary disease)   . Depression   . Hyperlipidemia   . BPH (benign prostatic hypertrophy)   . Pneumonia as a baby  . AAA (abdominal aortic aneurysm) 05/2012    mild 2.3 cm , rpt 1 year  . GERD (gastroesophageal reflux disease)   . Arthritis     fingers and hip     Review of Systems Per HPI    Objective:   Physical Exam  Nursing note and vitals reviewed. Constitutional: He appears well-developed and well-nourished. No distress.  HENT:  Head: Normocephalic and atraumatic.  Mouth/Throat: Oropharynx is clear and moist. No oropharyngeal exudate.  Eyes: Conjunctivae and EOM are normal. Pupils are equal, round, and reactive to light. No scleral icterus.  Cardiovascular: Normal rate, regular rhythm, normal heart sounds and intact distal pulses.   No murmur heard. Pulmonary/Chest: Effort normal and breath sounds normal. No respiratory distress. He has no wheezes. He has no rales.  Abdominal: Soft. Normal appearance and bowel sounds are  normal. He exhibits no distension and no mass. There is no hepatosplenomegaly. There is no tenderness. There is no rigidity, no rebound, no guarding and negative Murphy's sign.  Musculoskeletal: He exhibits no edema.  Skin: Skin is warm and dry. No rash noted.       Assessment & Plan:

## 2013-01-11 NOTE — Assessment & Plan Note (Signed)
Stable on current regimen. Continue. ?

## 2013-01-13 ENCOUNTER — Inpatient Hospital Stay (HOSPITAL_COMMUNITY): Admission: RE | Admit: 2013-01-13 | Payer: Medicare Other | Source: Ambulatory Visit

## 2013-01-18 ENCOUNTER — Other Ambulatory Visit (HOSPITAL_COMMUNITY): Payer: Self-pay | Admitting: Neurosurgery

## 2013-01-18 ENCOUNTER — Other Ambulatory Visit: Payer: Self-pay | Admitting: Radiology

## 2013-01-18 DIAGNOSIS — D492 Neoplasm of unspecified behavior of bone, soft tissue, and skin: Secondary | ICD-10-CM

## 2013-01-19 ENCOUNTER — Encounter (HOSPITAL_COMMUNITY): Payer: Self-pay

## 2013-01-20 ENCOUNTER — Ambulatory Visit: Admit: 2013-01-20 | Payer: Medicare Other | Admitting: Surgery

## 2013-01-20 SURGERY — LAPAROSCOPIC CHOLECYSTECTOMY
Anesthesia: General

## 2013-01-21 ENCOUNTER — Encounter (HOSPITAL_COMMUNITY): Payer: Self-pay

## 2013-01-21 ENCOUNTER — Ambulatory Visit (HOSPITAL_COMMUNITY)
Admission: RE | Admit: 2013-01-21 | Discharge: 2013-01-21 | Disposition: A | Discharge status: Home / Self Care | Payer: Medicare Other | Source: Ambulatory Visit | Attending: Neurosurgery | Admitting: Neurosurgery

## 2013-01-21 VITALS — BP 132/57 | HR 73 | Temp 97.4°F | Resp 16 | Ht 69.0 in | Wt 140.0 lb

## 2013-01-21 DIAGNOSIS — I10 Essential (primary) hypertension: Secondary | ICD-10-CM | POA: Insufficient documentation

## 2013-01-21 DIAGNOSIS — D216 Benign neoplasm of connective and other soft tissue of trunk, unspecified: Secondary | ICD-10-CM | POA: Insufficient documentation

## 2013-01-21 DIAGNOSIS — I714 Abdominal aortic aneurysm, without rupture, unspecified: Secondary | ICD-10-CM | POA: Insufficient documentation

## 2013-01-21 DIAGNOSIS — E119 Type 2 diabetes mellitus without complications: Secondary | ICD-10-CM | POA: Insufficient documentation

## 2013-01-21 DIAGNOSIS — F329 Major depressive disorder, single episode, unspecified: Secondary | ICD-10-CM | POA: Insufficient documentation

## 2013-01-21 DIAGNOSIS — K219 Gastro-esophageal reflux disease without esophagitis: Secondary | ICD-10-CM | POA: Insufficient documentation

## 2013-01-21 DIAGNOSIS — D492 Neoplasm of unspecified behavior of bone, soft tissue, and skin: Secondary | ICD-10-CM

## 2013-01-21 DIAGNOSIS — E785 Hyperlipidemia, unspecified: Secondary | ICD-10-CM | POA: Insufficient documentation

## 2013-01-21 DIAGNOSIS — N2881 Hypertrophy of kidney: Secondary | ICD-10-CM | POA: Insufficient documentation

## 2013-01-21 DIAGNOSIS — J449 Chronic obstructive pulmonary disease, unspecified: Secondary | ICD-10-CM | POA: Insufficient documentation

## 2013-01-21 DIAGNOSIS — F3289 Other specified depressive episodes: Secondary | ICD-10-CM | POA: Insufficient documentation

## 2013-01-21 DIAGNOSIS — N400 Enlarged prostate without lower urinary tract symptoms: Secondary | ICD-10-CM | POA: Insufficient documentation

## 2013-01-21 DIAGNOSIS — M129 Arthropathy, unspecified: Secondary | ICD-10-CM | POA: Insufficient documentation

## 2013-01-21 DIAGNOSIS — J4489 Other specified chronic obstructive pulmonary disease: Secondary | ICD-10-CM | POA: Insufficient documentation

## 2013-01-21 LAB — CBC
HCT: 36.3 % — ABNORMAL LOW (ref 39.0–52.0)
Hemoglobin: 12.8 g/dL — ABNORMAL LOW (ref 13.0–17.0)
MCHC: 35.3 g/dL (ref 30.0–36.0)
RBC: 4.13 MIL/uL — ABNORMAL LOW (ref 4.22–5.81)
WBC: 4.9 10*3/uL (ref 4.0–10.5)

## 2013-01-21 LAB — PROTIME-INR
INR: 1.01 (ref 0.00–1.49)
Prothrombin Time: 13.1 seconds (ref 11.6–15.2)

## 2013-01-21 LAB — APTT: aPTT: 27 seconds (ref 24–37)

## 2013-01-21 MED ORDER — HYDROCODONE-ACETAMINOPHEN 5-325 MG PO TABS: 1.0000 | ORAL_TABLET | ORAL | Status: DC | PRN

## 2013-01-21 MED ORDER — FENTANYL CITRATE 0.05 MG/ML IJ SOLN
INTRAMUSCULAR | Status: AC
  Filled 2013-01-21: qty 4

## 2013-01-21 MED ORDER — SODIUM CHLORIDE 0.9 % IV SOLN
INTRAVENOUS | Status: DC
  Administered 2013-01-21: 10:00:00 via INTRAVENOUS

## 2013-01-21 MED ORDER — GELATIN ABSORBABLE 12-7 MM EX MISC
CUTANEOUS | Status: AC
  Filled 2013-01-21: qty 1

## 2013-01-21 MED ORDER — MIDAZOLAM HCL 2 MG/2ML IJ SOLN
INTRAMUSCULAR | Status: AC
  Filled 2013-01-21: qty 4

## 2013-01-21 MED ORDER — FENTANYL CITRATE 0.05 MG/ML IJ SOLN
INTRAMUSCULAR | Status: AC | PRN
  Administered 2013-01-21 (×2): 25 ug via INTRAVENOUS
  Administered 2013-01-21: 50 ug via INTRAVENOUS

## 2013-01-21 MED ORDER — MIDAZOLAM HCL 2 MG/2ML IJ SOLN
INTRAMUSCULAR | Status: AC | PRN
  Administered 2013-01-21 (×2): 1 mg via INTRAVENOUS

## 2013-01-21 NOTE — Procedures (Signed)
Procedure:  CT guided core biopsy of right psoas muscle mass Findings:  3.5 cm rounded mass in right psoas muscle.  4 solid 18 G core biopsy samples obtained via 17 G needle.  No complications.

## 2013-01-21 NOTE — H&P (Signed)
Agree 

## 2013-01-21 NOTE — H&P (Signed)
Justin Benson is an 78 y.o. male.   Chief Complaint: Pt was being followed for elevated LFTs US showed enlarged kidney Was then sent for MRI which revealed R psoas mass Now scheduled for paraspinal mass biopsy No pain; no tired or weakness; + minimal wt loss HPI: DM; HTN; COPD; HLD; BPH; AAA  Past Medical History  Diagnosis Date  . Diabetes mellitus 4/02  . Hypertension 1996  . COPD (chronic obstructive pulmonary disease)   . Depression   . Hyperlipidemia   . BPH (benign prostatic hypertrophy)   . Pneumonia as a baby  . AAA (abdominal aortic aneurysm) 05/2012    mild 2.3 cm , rpt 1 year  . GERD (gastroesophageal reflux disease)   . Arthritis     fingers and hip  . 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 Choledocholithiasis 11/2012    s/p ERCP, considering lap chole    Past Surgical History  Procedure Laterality Date  . Circumcision  10/30/09    Dr Patsi Sears  . Colonoscopy  11/2008    2 small polyps (1 tubular), diverticulosis, lipoma, int/ext hemorrhoids Christella Hartigan)  . Endoscopic retrograde cholangiopancreatography (ercp) with propofol N/A 12/24/2012    Procedure: ENDOSCOPIC RETROGRADE CHOLANGIOPANCREATOGRAPHY (ERCP) WITH PROPOFOL;  Surgeon: Rachael Fee, MD;  Location: [facility];  Service: Endoscopy;  Laterality: N/A;    Family History  Problem Relation Age of Onset  . Prostate cancer Father   . Aneurysm Father     aortic  . Stroke Father   . Hodgkin's lymphoma Brother 20  . Prostate cancer Brother 62    1/2  . Prostate cancer Brother 47    1/2  . Other Sister     cerebral hemm  . Lupus Sister   . Colon cancer Neg Hx   . Diabetes Brother    Social History:  reports that he quit smoking about 20 years ago. His smoking use included Cigarettes. He has a 80 pack-year smoking history. He has never used smokeless tobacco. He reports that  drinks alcohol. He reports that he does not use illicit drugs.  Allergies: No Known  Allergies   (Not in a hospital admission)  Results for orders placed during the hospital encounter of 01/21/13 (from the past 48 hour(s))  CBC     Status: Abnormal   Collection Time    01/21/13  9:45 AM      Result Value Range   WBC 4.9  4.0 - 10.5 K/uL   RBC 4.13 (*) 4.22 - 5.81 MIL/uL   Hemoglobin 12.8 (*) 13.0 - 17.0 g/dL   HCT 29.5 (*) 62.1 - 30.8 %   MCV 87.9  78.0 - 100.0 fL   MCH 31.0  26.0 - 34.0 pg   MCHC 35.3  30.0 - 36.0 g/dL   RDW 65.7  84.6 - 96.2 %   Platelets 243  150 - 400 K/uL   No results found.  Review of Systems  Constitutional: Positive for weight loss. Negative for fever.       Minimal  Respiratory: Negative for shortness of breath.   Cardiovascular: Negative for chest pain.  Gastrointestinal: Negative for nausea, vomiting and abdominal pain.  Musculoskeletal: Negative for back pain.  Neurological: Negative for headaches.    There were no vitals taken for this visit. Physical Exam  Constitutional: He is oriented to person, place, and time.  Cardiovascular: Normal rate, regular rhythm and normal heart sounds.  No murmur heard. Respiratory: Effort normal and breath sounds normal. He has no wheezes.  GI: Soft. Bowel sounds are normal. There is no tenderness.  Musculoskeletal: Normal range of motion.  Neurological: He is alert and oriented to person, place, and time.  Skin: Skin is warm and dry.  Psychiatric: He has a normal mood and affect. His behavior is normal. Judgment and thought content normal.     Assessment/Plan Coincidental finding on MRI of Rt psoas mass  scheduled for biopsy now Pt aware of procedure benefits and risks and agreeable to proceed Consent signed and in chart  Justin Benson A 01/21/2013, 10:17 AM

## 2013-02-02 ENCOUNTER — Encounter (INDEPENDENT_AMBULATORY_CARE_PROVIDER_SITE_OTHER): Payer: Medicare Other | Admitting: Surgery

## 2013-02-04 ENCOUNTER — Other Ambulatory Visit: Payer: Self-pay

## 2013-02-22 ENCOUNTER — Encounter (HOSPITAL_COMMUNITY): Payer: Self-pay | Admitting: Pharmacy Technician

## 2013-02-25 NOTE — Pre-Procedure Instructions (Signed)
CODA MATHEY  02/25/2013   Your procedure is scheduled on:  Monday, March 08, 2013  Report to [facility] at 6:00 AM.  Call this number if you have problems the morning of surgery: [phone]   Remember:   Do not eat food or drink liquids after midnight.   Take these medicines the morning of surgery with A SIP OF WATER: amLODipine (NORVASC) 10 MG tablet, sertraline (ZOLOFT) 100  MG tablet   Stop taking Aspirin and herbal medications. Do not take any NSAIDs ie: Ibuprofen, Advil, Naproxen or any medication  containing Aspirin (1 week prior to surgery)  Do not wear jewelry, make-up or nail polish.  Do not wear lotions, powders, or perfumes. You may wear deodorant.  Do not shave 48 hours prior to surgery. Men may shave face and neck.  Do not bring valuables to the hospital.  [facility] is not responsible for any belongings or valuables.  Contacts, dentures or bridgework may not be worn into surgery.  Leave suitcase in the car. After surgery it may be brought to your room.  For patients admitted to the hospital, checkout time is 11:00 AM the day of discharge.   Patients discharged the day of surgery will not be allowed to drive home.  Name and phone number of your driver:   Special Instructions: Shower using CHG 2 nights before surgery and the night before surgery.  If you shower the day of surgery use CHG.  Use special wash - you have one bottle of CHG for all showers.  You should use approximately 1/3 of the bottle for each shower.   Please read over the following fact sheets that you were given: Pain Booklet, Coughing and Deep Breathing and Surgical Site Infection Prevention

## 2013-02-26 ENCOUNTER — Encounter (HOSPITAL_COMMUNITY): Payer: Self-pay

## 2013-02-26 ENCOUNTER — Encounter (HOSPITAL_COMMUNITY)
Admission: RE | Admit: 2013-02-26 | Discharge: 2013-02-26 | Disposition: A | Discharge status: Home / Self Care | Payer: Medicare Other | Source: Ambulatory Visit | Attending: Surgery | Admitting: Surgery

## 2013-02-26 ENCOUNTER — Telehealth (INDEPENDENT_AMBULATORY_CARE_PROVIDER_SITE_OTHER): Payer: Self-pay | Admitting: Surgery

## 2013-02-26 DIAGNOSIS — Z01812 Encounter for preprocedural laboratory examination: Secondary | ICD-10-CM | POA: Insufficient documentation

## 2013-02-26 HISTORY — PX: LAPAROSCOPIC CHOLECYSTECTOMY: SUR755

## 2013-02-26 LAB — CBC
MCHC: 34.9 g/dL (ref 30.0–36.0)
Platelets: 216 10*3/uL (ref 150–400)
RDW: 15.3 % (ref 11.5–15.5)
WBC: 6.1 10*3/uL (ref 4.0–10.5)

## 2013-02-26 LAB — BASIC METABOLIC PANEL
Chloride: 102 mEq/L (ref 96–112)
Creatinine, Ser: 0.71 mg/dL (ref 0.50–1.35)
GFR calc Af Amer: >90 mL/min (ref >90)
GFR calc non Af Amer: 90 mL/min — ABNORMAL LOW (ref >90)
Potassium: 4.4 mEq/L (ref 3.5–5.1)

## 2013-02-26 NOTE — Progress Notes (Signed)
Pt denies SOB and chest pain. Pt states that he is seen at [facility] but unsure of the cardiologists name.

## 2013-02-26 NOTE — Telephone Encounter (Signed)
Ester at [phone] that she has no orders for this patient attempting pre op for surgery 03/08/13

## 2013-03-01 ENCOUNTER — Other Ambulatory Visit (INDEPENDENT_AMBULATORY_CARE_PROVIDER_SITE_OTHER): Payer: Self-pay | Admitting: Surgery

## 2013-03-01 NOTE — Telephone Encounter (Signed)
Orders must have lapsed since it was so long ago Just redid them

## 2013-03-07 MED ORDER — CEFAZOLIN SODIUM-DEXTROSE 2-3 GM-% IV SOLR
2.0000 g | INTRAVENOUS | Status: AC
  Administered 2013-03-08: 2 g via INTRAVENOUS
  Filled 2013-03-07: qty 50

## 2013-03-07 NOTE — H&P (Signed)
Patient ID: Justin Benson, male DOB: 06/12/1935, 78 y.o. MRN: [id number]  Chief Complaint   Patient presents with   .  Abdominal Pain   HPI  Justin Benson is a 78 y.o. male.  HPIThis is a very pleasant gentleman referred by Dr. Christella Hartigan for evaluation of symptomatic cholelithiasis. He was actually recently found on physical examination to have elevated liver function tests. On further review he has had intermittent fevers and chills as well as nausea and vomiting but no pain. He was found ultrasound to have gallstones. Recent MRCP demonstrated a possible CBD stone.  He has since had a successful ERCP.  He is doing well.  Past Medical History   Diagnosis  Date   .  Diabetes mellitus  4/02   .  Hypertension  1996   .  COPD (chronic obstructive pulmonary disease)    .  Depression    .  Hyperlipidemia    .  BPH (benign prostatic hypertrophy)    .  Pneumonia  as a baby   .  AAA (abdominal aortic aneurysm)  05/2012     mild 2.3 cm , rpt 1 year   .  GERD (gastroesophageal reflux disease)    .  Arthritis      fingers and hip    Past Surgical History   Procedure  Laterality  Date   .  Circumcision   10/30/09     Dr Patsi Sears   .  Colonoscopy   11/2008     2 small polyps (1 tubular), diverticulosis, lipoma, int/ext hemorrhoids Christella Hartigan)    Family History   Problem  Relation  Age of Onset   .  Prostate cancer  Father    .  Aneurysm  Father      aortic   .  Stroke  Father    .  Hodgkin's lymphoma  Brother  20   .  Prostate cancer  Brother  89     1/2   .  Prostate cancer  Brother  5     1/2   .  Other  Sister      cerebral hemm   .  Lupus  Sister    .  Colon cancer  Neg Hx    .  Diabetes  Brother    Social History  History   Substance Use Topics   .  Smoking status:  Former Smoker -- 2.00 packs/day for 40 years     Types:  Cigarettes     Quit date:  07/29/1992   .  Smokeless tobacco:  Never Used   .  Alcohol Use:  Yes      Comment: Regular (2 days/week)   No Known Allergies   Current Outpatient Prescriptions   Medication  Sig  Dispense  Refill   .  amLODipine (NORVASC) 10 MG tablet  Take 10 mg by mouth daily before breakfast.     .  aspirin 81 MG tablet  Take 81 mg by mouth daily.     Marland Kitchen  atorvastatin (LIPITOR) 10 MG tablet  Take 10 mg by mouth every evening.     .  ciprofloxacin (CIPRO) 500 MG tablet  Take 1 tablet (500 mg total) by mouth 2 (two) times daily.  20 tablet  0   .  finasteride (PROSCAR) 5 MG tablet  Take 5 mg by mouth every evening.     .  metFORMIN (GLUCOPHAGE) 500 MG tablet  Take 500-1,000 mg by mouth 2 (two) times  daily with a meal. Takes 1 tablet in the morning and 2 at night     .  Multiple Vitamin (MULTIVITAMIN WITH MINERALS) TABS  Take 1 tablet by mouth daily.     Marland Kitchen  omeprazole (PRILOSEC) 40 MG capsule  Take 40 mg by mouth 2 (two) times daily.     .  quinapril (ACCUPRIL) 40 MG tablet  Take 40 mg by mouth at bedtime.     .  quinapril-hydrochlorothiazide (ACCURETIC) 20-25 MG per tablet  Take 1 tablet by mouth every evening.     .  sertraline (ZOLOFT) 100 MG tablet  Take 150 mg by mouth daily. Takes 1 and 1/2      No current facility-administered medications for this visit.   Review of Systems  Review of Systems  Constitutional: Positive for fever, chills and unexpected weight change.  HENT: Negative for hearing loss, congestion, sore throat, trouble swallowing and voice change.  Eyes: Negative for visual disturbance.  Respiratory: Negative for cough and wheezing.  Cardiovascular: Negative for chest pain, palpitations and leg swelling.  Gastrointestinal: Positive for nausea and vomiting. Negative for abdominal pain, diarrhea, constipation, blood in stool, abdominal distention, anal bleeding and rectal pain.  Genitourinary: Negative for hematuria and difficulty urinating.  Musculoskeletal: Negative for arthralgias.  Skin: Negative for rash and wound.  Neurological: Negative for seizures, syncope, weakness and headaches.  Hematological:  Negative for adenopathy. Does not bruise/bleed easily.  Psychiatric/Behavioral: Negative for confusion.  Blood pressure 138/86, pulse 72, temperature 97.7 F (36.5 C), temperature source Temporal, resp. rate 12, height 5\' 9"  (1.753 m), weight 139 lb 3.2 oz (63.141 kg).  Physical Exam  Physical Exam  Constitutional: He is oriented to person, place, and time. He appears well-developed and well-nourished. No distress.  HENT:  Head: Normocephalic and atraumatic.  Right Ear: External ear normal.  Left Ear: External ear normal.  Nose: Nose normal.  Mouth/Throat: Oropharynx is clear and moist. No oropharyngeal exudate.  Eyes: Conjunctivae are normal. Pupils are equal, round, and reactive to light. Right eye exhibits no discharge. Left eye exhibits no discharge. No scleral icterus.  Neck: Normal range of motion. Neck supple. No tracheal deviation present. No thyromegaly present.  Cardiovascular: Normal rate, regular rhythm, normal heart sounds and intact distal pulses.  No murmur heard.  Pulmonary/Chest: Effort normal and breath sounds normal. No respiratory distress. He has no wheezes.  Abdominal: Soft. Bowel sounds are normal. He exhibits no distension and no mass. There is no tenderness. There is no guarding.  Musculoskeletal: Normal range of motion. He exhibits no edema and no tenderness.  Lymphadenopathy:  He has no cervical adenopathy.  Neurological: He is alert and oriented to person, place, and time.  Skin: Skin is warm and dry. No rash noted. He is not diaphoretic. No erythema.  Psychiatric: His behavior is normal.  Data Reviewed  I have reviewed the notes from Dr. Christella Hartigan as well as his laboratory data, ultrasound, and MRCP  Assessment  Symptomatic cholelithiasis and possible choledocholithiasis  Plan  I agree with Dr. Christella Hartigan that the patient needs an eventual Laparoscopic cholecystectomy. I told him that even with the ERCP that he could still have symptomatic gallstones. I discussed  the surgical procedure in detail and gave him literature regarding the surgery. I discussed the risks of surgery which includes but is not limited to bleeding, infection, bile duct injury, bile leak, injury to surround structures, need to convert to an open procedure, et Karie Soda. He understands and wishes to proceed. Surgery  will be scheduled

## 2013-03-08 ENCOUNTER — Encounter (HOSPITAL_COMMUNITY): Payer: Self-pay | Admitting: Anesthesiology

## 2013-03-08 ENCOUNTER — Ambulatory Visit (HOSPITAL_COMMUNITY): Payer: Medicare Other | Admitting: Anesthesiology

## 2013-03-08 ENCOUNTER — Encounter (HOSPITAL_COMMUNITY): Payer: Self-pay | Admitting: *Deleted

## 2013-03-08 ENCOUNTER — Ambulatory Visit (HOSPITAL_COMMUNITY)
Admission: RE | Admit: 2013-03-08 | Discharge: 2013-03-08 | Disposition: A | Discharge status: Home / Self Care | Payer: Medicare Other | Source: Ambulatory Visit | Attending: Surgery | Admitting: Surgery

## 2013-03-08 ENCOUNTER — Ambulatory Visit (HOSPITAL_COMMUNITY): Payer: Medicare Other

## 2013-03-08 ENCOUNTER — Encounter (HOSPITAL_COMMUNITY)
Admission: RE | Disposition: A | Discharge status: Home / Self Care | Payer: Self-pay | Source: Ambulatory Visit | Attending: Surgery

## 2013-03-08 DIAGNOSIS — Z79899 Other long term (current) drug therapy: Secondary | ICD-10-CM | POA: Insufficient documentation

## 2013-03-08 DIAGNOSIS — Z8701 Personal history of pneumonia (recurrent): Secondary | ICD-10-CM | POA: Insufficient documentation

## 2013-03-08 DIAGNOSIS — K8020 Calculus of gallbladder without cholecystitis without obstruction: Secondary | ICD-10-CM | POA: Insufficient documentation

## 2013-03-08 DIAGNOSIS — E785 Hyperlipidemia, unspecified: Secondary | ICD-10-CM | POA: Insufficient documentation

## 2013-03-08 DIAGNOSIS — K8010 Calculus of gallbladder with chronic cholecystitis without obstruction: Secondary | ICD-10-CM

## 2013-03-08 DIAGNOSIS — E119 Type 2 diabetes mellitus without complications: Secondary | ICD-10-CM | POA: Insufficient documentation

## 2013-03-08 DIAGNOSIS — J449 Chronic obstructive pulmonary disease, unspecified: Secondary | ICD-10-CM | POA: Insufficient documentation

## 2013-03-08 DIAGNOSIS — F3289 Other specified depressive episodes: Secondary | ICD-10-CM | POA: Insufficient documentation

## 2013-03-08 DIAGNOSIS — F329 Major depressive disorder, single episode, unspecified: Secondary | ICD-10-CM | POA: Insufficient documentation

## 2013-03-08 DIAGNOSIS — Z87891 Personal history of nicotine dependence: Secondary | ICD-10-CM | POA: Insufficient documentation

## 2013-03-08 DIAGNOSIS — I714 Abdominal aortic aneurysm, without rupture, unspecified: Secondary | ICD-10-CM | POA: Insufficient documentation

## 2013-03-08 DIAGNOSIS — J4489 Other specified chronic obstructive pulmonary disease: Secondary | ICD-10-CM | POA: Insufficient documentation

## 2013-03-08 DIAGNOSIS — Z7982 Long term (current) use of aspirin: Secondary | ICD-10-CM | POA: Insufficient documentation

## 2013-03-08 DIAGNOSIS — Z800 Family history of malignant neoplasm of digestive organs: Secondary | ICD-10-CM | POA: Insufficient documentation

## 2013-03-08 DIAGNOSIS — N400 Enlarged prostate without lower urinary tract symptoms: Secondary | ICD-10-CM | POA: Insufficient documentation

## 2013-03-08 DIAGNOSIS — K219 Gastro-esophageal reflux disease without esophagitis: Secondary | ICD-10-CM | POA: Insufficient documentation

## 2013-03-08 DIAGNOSIS — Z8042 Family history of malignant neoplasm of prostate: Secondary | ICD-10-CM | POA: Insufficient documentation

## 2013-03-08 DIAGNOSIS — I10 Essential (primary) hypertension: Secondary | ICD-10-CM | POA: Insufficient documentation

## 2013-03-08 DIAGNOSIS — M129 Arthropathy, unspecified: Secondary | ICD-10-CM | POA: Insufficient documentation

## 2013-03-08 HISTORY — PX: CHOLECYSTECTOMY: SHX55

## 2013-03-08 LAB — GLUCOSE, CAPILLARY: Glucose-Capillary: 149 mg/dL — ABNORMAL HIGH (ref 70–99)

## 2013-03-08 SURGERY — LAPAROSCOPIC CHOLECYSTECTOMY WITH INTRAOPERATIVE CHOLANGIOGRAM
Anesthesia: General | Site: Abdomen | Wound class: Contaminated

## 2013-03-08 MED ORDER — BUPIVACAINE-EPINEPHRINE PF 0.25-1:200000 % IJ SOLN
INTRAMUSCULAR | Status: AC
  Filled 2013-03-08: qty 30

## 2013-03-08 MED ORDER — ACETAMINOPHEN 325 MG PO TABS: 650.0000 mg | ORAL_TABLET | ORAL | Status: DC | PRN

## 2013-03-08 MED ORDER — HYDROMORPHONE HCL PF 1 MG/ML IJ SOLN
INTRAMUSCULAR | Status: AC
  Filled 2013-03-08: qty 1

## 2013-03-08 MED ORDER — ESMOLOL HCL 10 MG/ML IV SOLN
INTRAVENOUS | Status: DC | PRN
  Administered 2013-03-08: 50 mg via INTRAVENOUS

## 2013-03-08 MED ORDER — SODIUM CHLORIDE 0.9 % IJ SOLN: 3.0000 mL | INTRAMUSCULAR | Status: DC | PRN

## 2013-03-08 MED ORDER — SODIUM CHLORIDE 0.9 % IV SOLN
INTRAVENOUS | Status: DC | PRN
  Administered 2013-03-08: 09:00:00

## 2013-03-08 MED ORDER — SODIUM CHLORIDE 0.9 % IV SOLN: 250.0000 mL | INTRAVENOUS | Status: DC | PRN

## 2013-03-08 MED ORDER — PROMETHAZINE HCL 25 MG/ML IJ SOLN: 6.2500 mg | INTRAMUSCULAR | Status: DC | PRN

## 2013-03-08 MED ORDER — LIDOCAINE HCL (CARDIAC) 20 MG/ML IV SOLN
INTRAVENOUS | Status: DC | PRN
  Administered 2013-03-08: 60 mg via INTRAVENOUS

## 2013-03-08 MED ORDER — NEOSTIGMINE METHYLSULFATE 1 MG/ML IJ SOLN
INTRAMUSCULAR | Status: DC | PRN
  Administered 2013-03-08: 5 mg via INTRAVENOUS

## 2013-03-08 MED ORDER — HYDROCODONE-ACETAMINOPHEN 5-325 MG PO TABS: 1.0000 | ORAL_TABLET | ORAL | Status: DC | PRN

## 2013-03-08 MED ORDER — ONDANSETRON HCL 4 MG/2ML IJ SOLN
INTRAMUSCULAR | Status: DC | PRN
  Administered 2013-03-08: 4 mg via INTRAVENOUS

## 2013-03-08 MED ORDER — ROCURONIUM BROMIDE 100 MG/10ML IV SOLN
INTRAVENOUS | Status: DC | PRN
  Administered 2013-03-08: 35 mg via INTRAVENOUS

## 2013-03-08 MED ORDER — ACETAMINOPHEN 650 MG RE SUPP: 650.0000 mg | RECTAL | Status: DC | PRN

## 2013-03-08 MED ORDER — SODIUM CHLORIDE 0.9 % IJ SOLN: 3.0000 mL | Freq: Two times a day (BID) | INTRAMUSCULAR | Status: DC

## 2013-03-08 MED ORDER — OXYCODONE HCL 5 MG PO TABS
ORAL_TABLET | ORAL | Status: AC
  Filled 2013-03-08: qty 1

## 2013-03-08 MED ORDER — BUPIVACAINE-EPINEPHRINE 0.25% -1:200000 IJ SOLN
INTRAMUSCULAR | Status: DC | PRN
  Administered 2013-03-08: 20 mL

## 2013-03-08 MED ORDER — OXYCODONE HCL 5 MG PO TABS
5.0000 mg | ORAL_TABLET | ORAL | Status: DC | PRN
  Administered 2013-03-08: 5 mg via ORAL

## 2013-03-08 MED ORDER — GLYCOPYRROLATE 0.2 MG/ML IJ SOLN
INTRAMUSCULAR | Status: DC | PRN
  Administered 2013-03-08: .7 mg via INTRAVENOUS

## 2013-03-08 MED ORDER — ONDANSETRON HCL 4 MG/2ML IJ SOLN: 4.0000 mg | Freq: Once | INTRAMUSCULAR | Status: DC | PRN

## 2013-03-08 MED ORDER — PROPOFOL 10 MG/ML IV BOLUS
INTRAVENOUS | Status: DC | PRN
  Administered 2013-03-08: 200 mg via INTRAVENOUS

## 2013-03-08 MED ORDER — FENTANYL CITRATE 0.05 MG/ML IJ SOLN
INTRAMUSCULAR | Status: DC | PRN
  Administered 2013-03-08: 100 ug via INTRAVENOUS
  Administered 2013-03-08 (×3): 50 ug via INTRAVENOUS

## 2013-03-08 MED ORDER — HYDROMORPHONE HCL PF 1 MG/ML IJ SOLN
0.2500 mg | INTRAMUSCULAR | Status: DC | PRN
  Administered 2013-03-08 (×2): 0.5 mg via INTRAVENOUS

## 2013-03-08 MED ORDER — 0.9 % SODIUM CHLORIDE (POUR BTL) OPTIME
TOPICAL | Status: DC | PRN
  Administered 2013-03-08: 1000 mL

## 2013-03-08 MED ORDER — SODIUM CHLORIDE 0.9 % IR SOLN
Status: DC | PRN
  Administered 2013-03-08: 1000 mL

## 2013-03-08 MED ORDER — LACTATED RINGERS IV SOLN: INTRAVENOUS | Status: DC

## 2013-03-08 MED ORDER — ONDANSETRON HCL 4 MG/2ML IJ SOLN: 4.0000 mg | Freq: Four times a day (QID) | INTRAMUSCULAR | Status: DC | PRN

## 2013-03-08 MED ORDER — MORPHINE SULFATE 4 MG/ML IJ SOLN: 4.0000 mg | INTRAMUSCULAR | Status: DC | PRN

## 2013-03-08 MED ORDER — LACTATED RINGERS IV SOLN
INTRAVENOUS | Status: DC
  Administered 2013-03-08: 07:00:00 via INTRAVENOUS

## 2013-03-08 SURGICAL SUPPLY — 40 items
APPLIER CLIP 5 13 M/L LIGAMAX5 (MISCELLANEOUS) ×3
BANDAGE ADHESIVE 1X3 (GAUZE/BANDAGES/DRESSINGS) ×12 IMPLANT
BENZOIN TINCTURE PRP APPL 2/3 (GAUZE/BANDAGES/DRESSINGS) ×3 IMPLANT
CANISTER SUCTION 2500CC (MISCELLANEOUS) ×3 IMPLANT
CHLORAPREP W/TINT 26ML (MISCELLANEOUS) ×3 IMPLANT
CLIP APPLIE 5 13 M/L LIGAMAX5 (MISCELLANEOUS) ×2 IMPLANT
CLOTH BEACON ORANGE TIMEOUT ST (SAFETY) ×3 IMPLANT
COVER MAYO STAND STRL (DRAPES) ×3 IMPLANT
COVER SURGICAL LIGHT HANDLE (MISCELLANEOUS) ×3 IMPLANT
DECANTER SPIKE VIAL GLASS SM (MISCELLANEOUS) IMPLANT
DRAPE C-ARM 42X72 X-RAY (DRAPES) ×3 IMPLANT
ELECT REM PT RETURN 9FT ADLT (ELECTROSURGICAL) ×3
ELECTRODE REM PT RTRN 9FT ADLT (ELECTROSURGICAL) ×2 IMPLANT
GLOVE BIO SURGEON STRL SZ7.5 (GLOVE) ×3 IMPLANT
GLOVE BIOGEL PI IND STRL 7.0 (GLOVE) ×4 IMPLANT
GLOVE BIOGEL PI IND STRL 7.5 (GLOVE) ×2 IMPLANT
GLOVE BIOGEL PI INDICATOR 7.0 (GLOVE) ×2
GLOVE BIOGEL PI INDICATOR 7.5 (GLOVE) ×1
GLOVE SURG SIGNA 7.5 PF LTX (GLOVE) ×3 IMPLANT
GLOVE SURG SS PI 7.0 STRL IVOR (GLOVE) ×3 IMPLANT
GOWN STRL NON-REIN LRG LVL3 (GOWN DISPOSABLE) ×6 IMPLANT
GOWN STRL REIN XL XLG (GOWN DISPOSABLE) ×3 IMPLANT
KIT BASIN OR (CUSTOM PROCEDURE TRAY) ×3 IMPLANT
KIT ROOM TURNOVER OR (KITS) ×3 IMPLANT
NS IRRIG 1000ML POUR BTL (IV SOLUTION) ×3 IMPLANT
PAD ARMBOARD 7.5X6 YLW CONV (MISCELLANEOUS) ×3 IMPLANT
POUCH SPECIMEN RETRIEVAL 10MM (ENDOMECHANICALS) ×3 IMPLANT
SCISSORS LAP 5X35 DISP (ENDOMECHANICALS) IMPLANT
SET CHOLANGIOGRAPH 5 50 .035 (SET/KITS/TRAYS/PACK) ×3 IMPLANT
SET IRRIG TUBING LAPAROSCOPIC (IRRIGATION / IRRIGATOR) ×3 IMPLANT
SLEEVE ENDOPATH XCEL 5M (ENDOMECHANICALS) ×6 IMPLANT
SPECIMEN JAR MEDIUM (MISCELLANEOUS) ×3 IMPLANT
SPECIMEN JAR SMALL (MISCELLANEOUS) IMPLANT
SUT MON AB 4-0 PC3 18 (SUTURE) ×3 IMPLANT
TOWEL OR 17X24 6PK STRL BLUE (TOWEL DISPOSABLE) ×3 IMPLANT
TOWEL OR 17X26 10 PK STRL BLUE (TOWEL DISPOSABLE) ×3 IMPLANT
TRAY LAPAROSCOPIC (CUSTOM PROCEDURE TRAY) ×3 IMPLANT
TROCAR XCEL BLUNT TIP 100MML (ENDOMECHANICALS) ×3 IMPLANT
TROCAR XCEL NON-BLD 5MMX100MML (ENDOMECHANICALS) ×3 IMPLANT
WATER STERILE IRR 1000ML POUR (IV SOLUTION) IMPLANT

## 2013-03-08 NOTE — Anesthesia Procedure Notes (Signed)
Procedure Name: Intubation Date/Time: 03/08/2013 8:51 AM Performed by: Leona Singleton A Pre-anesthesia Checklist: Patient identified, Emergency Drugs available, Suction available and Patient being monitored Patient Re-evaluated:Patient Re-evaluated prior to inductionOxygen Delivery Method: Circle system utilized Preoxygenation: Pre-oxygenation with 100% oxygen Intubation Type: IV induction Ventilation: Mask ventilation without difficulty Laryngoscope Size: Miller and 2 Grade View: Grade I Tube type: Oral Tube size: 7.0 mm Number of attempts: 1 Airway Equipment and Method: Stylet Placement Confirmation: ETT inserted through vocal cords under direct vision,  positive ETCO2 and breath sounds checked- equal and bilateral Secured at: 22 cm Tube secured with: Tape Dental Injury: Teeth and Oropharynx as per pre-operative assessment

## 2013-03-08 NOTE — Progress Notes (Signed)
Pt voided. Discharged to home with sister

## 2013-03-08 NOTE — Transfer of Care (Signed)
Immediate Anesthesia Transfer of Care Note  Patient: Justin Benson  Procedure(s) Performed: Procedure(s): LAPAROSCOPIC CHOLECYSTECTOMY WITH INTRAOPERATIVE CHOLANGIOGRAM (N/A)  Patient Location: PACU  Anesthesia Type:General  Level of Consciousness: awake, alert , oriented and patient cooperative  Airway & Oxygen Therapy: Patient Spontanous Breathing and Patient connected to nasal cannula oxygen  Post-op Assessment: Report given to PACU RN and Post -op Vital signs reviewed and stable  Post vital signs: Reviewed and stable  Complications: No apparent anesthesia complications

## 2013-03-08 NOTE — Op Note (Signed)
Laparoscopic Cholecystectomy with IOC Procedure Note  Indications: This patient presents with symptomatic gallbladder disease and will undergo laparoscopic cholecystectomy.  Pre-operative Diagnosis: Calculus of gallbladder without mention of cholecystitis or obstruction  Post-operative Diagnosis: Same  Surgeon: Abigail Miyamoto A   Assistants: 0  Anesthesia: General endotracheal anesthesia  ASA Class: 2  Procedure Details  The patient was seen again in the Holding Room. The risks, benefits, complications, treatment options, and expected outcomes were discussed with the patient. The possibilities of reaction to medication, pulmonary aspiration, perforation of viscus, bleeding, recurrent infection, finding a normal gallbladder, the need for additional procedures, failure to diagnose a condition, the possible need to convert to an open procedure, and creating a complication requiring transfusion or operation were discussed with the patient. The likelihood of improving the patient's symptoms with return to their baseline status is good.  The patient and/or family concurred with the proposed plan, giving informed consent. The site of surgery properly noted. The patient was taken to Operating Room, identified as Justin Benson and the procedure verified as Laparoscopic Cholecystectomy with Intraoperative Cholangiogram. A Time Out was held and the above information confirmed.  Prior to the induction of general anesthesia, antibiotic prophylaxis was administered. General endotracheal anesthesia was then administered and tolerated well. After the induction, the abdomen was prepped with Chloraprep and draped in the sterile fashion. The patient was positioned in the supine position.  Local anesthetic agent was injected into the skin near the umbilicus and an incision made. We dissected down to the abdominal fascia with blunt dissection.  The fascia was incised vertically and we entered the peritoneal cavity  bluntly.  A pursestring suture of 0-Vicryl was placed around the fascial opening.  The Hasson cannula was inserted and secured with the stay suture.  Pneumoperitoneum was then created with CO2 and tolerated well without any adverse changes in the patient's vital signs. An 11-mm port was placed in the subxiphoid position.  Two 5-mm ports were placed in the right upper quadrant. All skin incisions were infiltrated with a local anesthetic agent before making the incision and placing the trocars.   We positioned the patient in reverse Trendelenburg, tilted slightly to the patient's left.  The gallbladder was identified, the fundus grasped and retracted cephalad. Adhesions were lysed bluntly and with the electrocautery where indicated, taking care not to injure any adjacent organs or viscus. The infundibulum was grasped and retracted laterally, exposing the peritoneum overlying the triangle of Calot. This was then divided and exposed in a blunt fashion. A critical view of the cystic duct and cystic artery was obtained.  The cystic duct was clearly identified and bluntly dissected circumferentially. The cystic duct was ligated with a clip distally.   An incision was made in the cystic duct and the [facility] cholangiogram catheter introduced. The catheter was secured using a clip. A cholangiogram was then obtained which showed good visualization of the distal and proximal biliary tree with no sign of filling defects or obstruction.  Contrast flowed easily into the duodenum. The catheter was then removed.   The cystic duct was then ligated with clips and divided. The cystic artery was identified, dissected free, ligated with clips and divided as well.   The gallbladder was dissected from the liver bed in retrograde fashion with the electrocautery. The gallbladder was removed and placed in an Endocatch sac. The liver bed was irrigated and inspected. Hemostasis was achieved with the electrocautery. Copious irrigation was  utilized and was repeatedly aspirated until clear.  The gallbladder and Endocatch sac were then removed through the umbilical port site.  The pursestring suture was used to close the umbilical fascia.    We again inspected the right upper quadrant for hemostasis.  Pneumoperitoneum was released as we removed the trocars.  4-0 Monocryl was used to close the skin.   Benzoin, steri-strips, and clean dressings were applied. The patient was then extubated and brought to the recovery room in stable condition. Instrument, sponge, and needle counts were correct at closure and at the conclusion of the case.   Findings: Chronic Cholecystitis with Cholelithiasis  Estimated Blood Loss: Minimal         Drains: 0         Specimens: Gallbladder           Complications: None; patient tolerated the procedure well.         Disposition: PACU - hemodynamically stable.         Condition: stable

## 2013-03-08 NOTE — Anesthesia Preprocedure Evaluation (Addendum)
Anesthesia Evaluation  Patient identified by MRN, date of birth, ID band  Reviewed: Allergy & Precautions, H&P , NPO status , Patient's Chart, lab work & pertinent test results  History of Anesthesia Complications Negative for: history of anesthetic complications  Airway Mallampati: II TM Distance: >3 FB Neck ROM: Full    Dental  (+) Teeth Intact and Dental Advisory Given   Pulmonary COPDformer smoker,          Cardiovascular hypertension, Pt. on medications + CABG and + Peripheral Vascular Disease  AAA   Neuro/Psych PSYCHIATRIC DISORDERS Depression negative neurological ROS     GI/Hepatic Neg liver ROS, GERD-  Medicated and Controlled,  Endo/Other  diabetes, Type 2, Oral Hypoglycemic Agents  Renal/GU negative Renal ROS     Musculoskeletal  (+) Arthritis -, Osteoarthritis,    Abdominal   Peds  Hematology negative hematology ROS (+)   Anesthesia Other Findings   Reproductive/Obstetrics                        Anesthesia Physical Anesthesia Plan  ASA: III  Anesthesia Plan: General   Post-op Pain Management:    Induction: Intravenous  Airway Management Planned: Oral ETT  Additional Equipment:   Intra-op Plan:   Post-operative Plan: Extubation in OR  Informed Consent: I have reviewed the patients History and Physical, chart, labs and discussed the procedure including the risks, benefits and alternatives for the proposed anesthesia with the patient or authorized representative who has indicated his/her understanding and acceptance.     Plan Discussed with: CRNA, Anesthesiologist and Surgeon  Anesthesia Plan Comments:         Anesthesia Quick Evaluation

## 2013-03-08 NOTE — Interval H&P Note (Signed)
History and Physical Interval Note: no change in H and P  03/08/2013 7:01 AM  Justin Benson  has presented today for surgery, with the diagnosis of symptomatic cholelithiasis  The various methods of treatment have been discussed with the patient and family. After consideration of risks, benefits and other options for treatment, the patient has consented to  Procedure(s): LAPAROSCOPIC CHOLECYSTECTOMY (N/A) INTRAOPERATIVE CHOLANGIOGRAM (N/A) as a surgical intervention .  The patient's history has been reviewed, patient examined, no change in status, stable for surgery.  I have reviewed the patient's chart and labs.  Questions were answered to the patient's satisfaction.     Armel Rabbani A

## 2013-03-08 NOTE — Anesthesia Postprocedure Evaluation (Signed)
Anesthesia Post Note  Patient: Justin Benson  Procedure(s) Performed: Procedure(s) (LRB): LAPAROSCOPIC CHOLECYSTECTOMY WITH INTRAOPERATIVE CHOLANGIOGRAM (N/A)  Anesthesia type: General  Patient location: PACU  Post pain: Pain level controlled and Adequate analgesia  Post assessment: Post-op Vital signs reviewed, Patient's Cardiovascular Status Stable, Respiratory Function Stable, Patent Airway and Pain level controlled  Last Vitals:  Filed Vitals:   03/08/13 0943  BP: 188/81  Pulse: 85  Temp: 36.7 C  Resp: 17    Post vital signs: Reviewed and stable  Level of consciousness: awake, alert  and oriented  Complications: No apparent anesthesia complications

## 2013-03-08 NOTE — Preoperative (Signed)
Beta Blockers   Reason not to administer Beta Blockers:Not Applicable 

## 2013-03-09 ENCOUNTER — Encounter (HOSPITAL_COMMUNITY): Payer: Self-pay | Admitting: Surgery

## 2013-03-23 ENCOUNTER — Encounter (INDEPENDENT_AMBULATORY_CARE_PROVIDER_SITE_OTHER): Payer: Self-pay | Admitting: Surgery

## 2013-03-23 ENCOUNTER — Ambulatory Visit (INDEPENDENT_AMBULATORY_CARE_PROVIDER_SITE_OTHER): Payer: Medicare Other | Admitting: Surgery

## 2013-03-23 VITALS — BP 126/71 | HR 62 | Temp 97.3°F | Resp 14 | Ht 69.5 in | Wt 141.8 lb

## 2013-03-23 DIAGNOSIS — Z09 Encounter for follow-up examination after completed treatment for conditions other than malignant neoplasm: Secondary | ICD-10-CM

## 2013-03-23 NOTE — Progress Notes (Signed)
Subjective:     Patient ID: Justin Benson, male   DOB: 1935-06-09, 78 y.o.   MRN: [id number]  HPI He is here for his first postop visit status post laparoscopic cholecystectomy. He is doing well has no complaints he is eating well and moving his bowels well  Review of Systems     Objective:   Physical Exam On exam, his incisions are well healed. The final pathology showed chronic cholecystitis with gallstones    Assessment:     Patient stable postop     Plan:     He may resume normal activity. I will see him back as needed

## 2013-05-24 ENCOUNTER — Other Ambulatory Visit: Payer: Self-pay | Admitting: Family Medicine

## 2013-05-24 DIAGNOSIS — E785 Hyperlipidemia, unspecified: Secondary | ICD-10-CM

## 2013-05-24 DIAGNOSIS — I10 Essential (primary) hypertension: Secondary | ICD-10-CM

## 2013-05-24 DIAGNOSIS — R634 Abnormal weight loss: Secondary | ICD-10-CM

## 2013-05-24 DIAGNOSIS — N400 Enlarged prostate without lower urinary tract symptoms: Secondary | ICD-10-CM

## 2013-05-24 DIAGNOSIS — E119 Type 2 diabetes mellitus without complications: Secondary | ICD-10-CM

## 2013-05-25 ENCOUNTER — Other Ambulatory Visit (INDEPENDENT_AMBULATORY_CARE_PROVIDER_SITE_OTHER): Payer: Medicare Other

## 2013-05-25 DIAGNOSIS — E119 Type 2 diabetes mellitus without complications: Secondary | ICD-10-CM

## 2013-05-25 DIAGNOSIS — D72821 Monocytosis (symptomatic): Secondary | ICD-10-CM

## 2013-05-25 DIAGNOSIS — I10 Essential (primary) hypertension: Secondary | ICD-10-CM

## 2013-05-25 DIAGNOSIS — E785 Hyperlipidemia, unspecified: Secondary | ICD-10-CM

## 2013-05-25 DIAGNOSIS — R634 Abnormal weight loss: Secondary | ICD-10-CM

## 2013-05-25 DIAGNOSIS — N400 Enlarged prostate without lower urinary tract symptoms: Secondary | ICD-10-CM

## 2013-05-25 LAB — CBC WITH DIFFERENTIAL/PLATELET
Basophils Absolute: 0 10*3/uL (ref 0.0–0.1)
HCT: 39.1 % (ref 39.0–52.0)
Lymphs Abs: 1.5 10*3/uL (ref 0.7–4.0)
Monocytes Relative: 14.8 % — ABNORMAL HIGH (ref 3.0–12.0)
Neutro Abs: 3.1 10*3/uL (ref 1.4–7.7)
Platelets: 238 10*3/uL (ref 150.0–400.0)
RBC: 4.2 Mil/uL — ABNORMAL LOW (ref 4.22–5.81)
RDW: 15.5 % — ABNORMAL HIGH (ref 11.5–14.6)
WBC: 5.7 10*3/uL (ref 4.5–10.5)

## 2013-05-25 LAB — COMPREHENSIVE METABOLIC PANEL
Albumin: 4.5 g/dL (ref 3.5–5.2)
BUN: 18 mg/dL (ref 6–23)
CO2: 31 mEq/L (ref 19–32)
Calcium: 9.7 mg/dL (ref 8.4–10.5)
Chloride: 99 mEq/L (ref 96–112)
GFR: 102.87 mL/min (ref >60.00)
Glucose, Bld: 134 mg/dL — ABNORMAL HIGH (ref 70–99)
Potassium: 4.9 mEq/L (ref 3.5–5.1)

## 2013-05-25 LAB — LIPID PANEL: Cholesterol: 200 mg/dL (ref 0–200)

## 2013-05-25 LAB — PSA: PSA: 0.52 ng/mL (ref 0.10–4.00)

## 2013-05-25 LAB — MICROALBUMIN / CREATININE URINE RATIO: Microalb, Ur: 1 mg/dL (ref 0.0–1.9)

## 2013-05-25 LAB — HEMOGLOBIN A1C: Hgb A1c MFr Bld: 6.9 % — ABNORMAL HIGH (ref 4.6–6.5)

## 2013-05-25 NOTE — Addendum Note (Signed)
Addended by: Alvina Chou on: 05/25/2013 02:10 PM   Modules accepted: Orders

## 2013-05-26 LAB — PATHOLOGIST SMEAR REVIEW

## 2013-06-01 ENCOUNTER — Encounter: Payer: Self-pay | Admitting: Family Medicine

## 2013-06-01 ENCOUNTER — Ambulatory Visit (INDEPENDENT_AMBULATORY_CARE_PROVIDER_SITE_OTHER): Payer: Medicare Other | Admitting: Family Medicine

## 2013-06-01 VITALS — BP 132/74 | HR 68 | Temp 97.7°F | Ht 69.5 in | Wt 144.8 lb

## 2013-06-01 DIAGNOSIS — F329 Major depressive disorder, single episode, unspecified: Secondary | ICD-10-CM

## 2013-06-01 DIAGNOSIS — Z0000 Encounter for general adult medical examination without abnormal findings: Secondary | ICD-10-CM

## 2013-06-01 DIAGNOSIS — F3289 Other specified depressive episodes: Secondary | ICD-10-CM

## 2013-06-01 DIAGNOSIS — F1010 Alcohol abuse, uncomplicated: Secondary | ICD-10-CM

## 2013-06-01 DIAGNOSIS — I10 Essential (primary) hypertension: Secondary | ICD-10-CM

## 2013-06-01 DIAGNOSIS — E785 Hyperlipidemia, unspecified: Secondary | ICD-10-CM

## 2013-06-01 DIAGNOSIS — E119 Type 2 diabetes mellitus without complications: Secondary | ICD-10-CM

## 2013-06-01 DIAGNOSIS — N400 Enlarged prostate without lower urinary tract symptoms: Secondary | ICD-10-CM

## 2013-06-01 MED ORDER — ATORVASTATIN CALCIUM 10 MG PO TABS: 5.0000 mg | ORAL_TABLET | Freq: Every evening | ORAL | Status: DC

## 2013-06-01 MED ORDER — FINASTERIDE 5 MG PO TABS: 5.0000 mg | ORAL_TABLET | Freq: Every evening | ORAL | Status: DC

## 2013-06-01 MED ORDER — QUINAPRIL-HYDROCHLOROTHIAZIDE 20-25 MG PO TABS: 1.0000 | ORAL_TABLET | Freq: Every evening | ORAL | Status: DC

## 2013-06-01 MED ORDER — SERTRALINE HCL 100 MG PO TABS: 150.0000 mg | ORAL_TABLET | Freq: Every day | ORAL | Status: DC

## 2013-06-01 MED ORDER — METFORMIN HCL 500 MG PO TABS: 500.0000 mg | ORAL_TABLET | Freq: Two times a day (BID) | ORAL | Status: DC

## 2013-06-01 MED ORDER — AMLODIPINE BESYLATE 10 MG PO TABS: 10.0000 mg | ORAL_TABLET | Freq: Every day | ORAL | Status: DC

## 2013-06-01 MED ORDER — QUINAPRIL HCL 20 MG PO TABS: 20.0000 mg | ORAL_TABLET | Freq: Every day | ORAL | Status: DC

## 2013-06-01 NOTE — Patient Instructions (Addendum)
I do agree with starting to attend AA meetings given recent increase in alcohol intake Bring me a copy of advanced directive to update chart. Make sure multivitamin has both thiamine and folate in it.

## 2013-06-01 NOTE — Assessment & Plan Note (Signed)
Continue sertraline 150mg  daily.  Encouraged abstinence from EtOH.

## 2013-06-01 NOTE — Assessment & Plan Note (Signed)
Continue finasteride.  psa stable.

## 2013-06-01 NOTE — Assessment & Plan Note (Signed)
Encouraged participation in local AA chapter - pt motivated. Encouraged full abstinence from EtOH. Already on MVI daily.

## 2013-06-01 NOTE — Assessment & Plan Note (Signed)
I have personally reviewed the Medicare Annual Wellness questionnaire and have noted 1. The patient's medical and social history 2. Their use of alcohol, tobacco or illicit drugs 3. Their current medications and supplements 4. The patient's functional ability including ADL's, fall risks, home safety risks and hearing or visual impairment. 5. Diet and physical activity 6. Evidence for depression or mood disorders The patients weight, height, BMI have been recorded in the chart.  Hearing and vision has been addressed. I have made referrals, counseling and provided education to the patient based review of the above and I have provided the pt with a written personalized care plan for preventive services. See scanned questionairre. Advanced directives discussed: would want daughter to be HCPOA - has living will at home, will bring [place] a copy.  Reviewed preventative protocols and updated unless pt declined. DRE/PSA reassuring today. Due for colonoscopy next year. Reviewed blood work in detail.
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
## 2013-06-01 NOTE — Assessment & Plan Note (Signed)
Chronic, stable. Continue meds. 

## 2013-06-01 NOTE — Assessment & Plan Note (Signed)
Stable, continue low dose atorvastatin. Discussed goal for him <70, if persistent elevated - increase lipitor dose.

## 2013-09-10 ENCOUNTER — Telehealth: Payer: Self-pay | Admitting: *Deleted

## 2013-09-10 NOTE — Telephone Encounter (Signed)
Lmom to call our office to sched Abd Aorta (1 yr f/u)

## 2013-10-27 LAB — HM DIABETES EYE EXAM

## 2013-11-29 ENCOUNTER — Encounter: Payer: Self-pay | Admitting: Family Medicine

## 2013-11-29 ENCOUNTER — Ambulatory Visit (INDEPENDENT_AMBULATORY_CARE_PROVIDER_SITE_OTHER): Payer: Medicare Other | Admitting: Family Medicine

## 2013-11-29 VITALS — BP 140/64 | HR 72 | Temp 97.8°F | Ht 69.5 in | Wt 146.5 lb

## 2013-11-29 DIAGNOSIS — F1010 Alcohol abuse, uncomplicated: Secondary | ICD-10-CM

## 2013-11-29 DIAGNOSIS — E119 Type 2 diabetes mellitus without complications: Secondary | ICD-10-CM

## 2013-11-29 DIAGNOSIS — I10 Essential (primary) hypertension: Secondary | ICD-10-CM

## 2013-11-29 DIAGNOSIS — E785 Hyperlipidemia, unspecified: Secondary | ICD-10-CM

## 2013-11-29 DIAGNOSIS — F3289 Other specified depressive episodes: Secondary | ICD-10-CM

## 2013-11-29 DIAGNOSIS — F329 Major depressive disorder, single episode, unspecified: Secondary | ICD-10-CM

## 2013-11-29 LAB — HEMOGLOBIN A1C: HEMOGLOBIN A1C: 6.6 % — AB (ref 4.6–6.5)

## 2013-11-29 NOTE — Patient Instructions (Signed)
Blood work today. Return in 6 months for physical, prior fasting for further blood work. Good job with sugar control up to now. Continue blood pressure medicines and cholesterol medicines as up to now

## 2013-11-29 NOTE — Assessment & Plan Note (Signed)
Chronic, stable. recheck A1c today.

## 2013-11-29 NOTE — Assessment & Plan Note (Signed)
Chronic, stable. Continue sertraline 150mg  daily.

## 2013-11-29 NOTE — Assessment & Plan Note (Signed)
Has decreased alcohol use by 1/2.

## 2013-11-29 NOTE — Assessment & Plan Note (Signed)
Chronic, stable. Continue low dose lipitor.  Lab Results  Component Value Date   LDLCALC 94 05/25/2013

## 2013-11-29 NOTE — Assessment & Plan Note (Signed)
Chronic, stable. Continue meds. Tolerating meds well, doubt mild dizziness due to orthostasis.

## 2013-11-29 NOTE — Progress Notes (Signed)
BP 140/64  Pulse 72  Temp(Src) 97.8 F (36.6 C) (Oral)  Ht 5' 9.5" (1.765 m)  Wt 146 lb 8 oz (66.452 kg)  BMI 21.33 kg/m2  SpO2 97%   CC: 6 mo f/u  Subjective:    Patient ID: Justin Benson, male    DOB: 08/04/1934, 79 y.o.   MRN: [id number]  HPI: Justin Benson is a 79 y.o. male presenting on 11/29/2013 for 6 mth f/u   Depression - 150mg  sertraline daily. 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 Has decreased alcohol intake by 1/2.  DM - regularly does check sugars: fasting low 100s, nonfasting 140-160s.  Compliant with antihyperglycemic regimen which includes: metformin 500mg  in am and 1000mg  at night time.  Denies low sugars or hypoglycemic symptoms.  Denies paresthesias. Last diabetic eye exam 4 1 15.  Pneumovax: 06/29/2003.  Prevnar: DUE.  Lab Results  Component Value Date   HGBA1C 6.9* 05/25/2013    HTN - Compliant with current antihypertensive regimen of amlodipine 10mg  in am, quinapril hctz in am and quinapril 20mg  at bedtime.  Some dizziness with bending over or rapid movements.  Does check blood pressures at home: well controlled.  No low blood pressure readings.  Denies HA, vision changes, CP/tightness, SOB, leg swelling.    HLD - compliant with lipitor 5mg  daily.  No myalgias.  Increasing joint pains.  Relevant past medical, surgical, family and social history reviewed and updated as indicated.  Allergies and medications reviewed and updated. Current Outpatient Prescriptions on File Prior to Visit  Medication Sig  . amLODipine (NORVASC) 10 MG tablet Take 1 tablet (10 mg total) by mouth daily before breakfast.  . aspirin 81 MG tablet Take 81 mg by mouth daily.    Marland Kitchen atorvastatin (LIPITOR) 10 MG tablet Take 0.5 tablets (5 mg total) by mouth every evening.  . finasteride (PROSCAR) 5 MG tablet Take 1 tablet (5 mg total) by mouth every evening.  . metFORMIN (GLUCOPHAGE) 500 MG tablet Take 1-2 tablets (500-1,000 mg total) by mouth 2 (two) times daily with  a meal. Takes 1 tablet in the morning and 2 at night  . Multiple Vitamin (MULTIVITAMIN WITH MINERALS) TABS Take 1 tablet by mouth daily.  . quinapril (ACCUPRIL) 20 MG tablet Take 1 tablet (20 mg total) by mouth at bedtime.  . sertraline (ZOLOFT) 100 MG tablet Take 1.5 tablets (150 mg total) by mouth daily. Takes 1 and 1/2   No current facility-administered medications on file prior to visit.    Review of Systems Per HPI unless specifically indicated above    Objective:    BP 140/64  Pulse 72  Temp(Src) 97.8 F (36.6 C) (Oral)  Ht 5' 9.5" (1.765 m)  Wt 146 lb 8 oz (66.452 kg)  BMI 21.33 kg/m2  SpO2 97%  Physical Exam  Nursing note and vitals reviewed. Constitutional: He appears well-developed and well-nourished. No distress.  HENT:  Mouth/Throat: Oropharynx is clear and moist. No oropharyngeal exudate.  Cardiovascular: Normal rate, regular rhythm, normal heart sounds and intact distal pulses.   No murmur heard. Pulmonary/Chest: Effort normal and breath sounds normal. No respiratory distress. He has no wheezes. He has no rales.  Musculoskeletal: He exhibits no edema.  Diabetic foot exam: Normal inspection No skin breakdown No calluses  Normal DP pulses Normal sensation to light touch and monofilament Nails normal   Results for orders placed in visit on 11/29/13  HM DIABETES EYE EXAM      Result Value Ref Range  HM Diabetic Eye Exam No Retinopathy  No Retinopathy      Assessment & Plan:   Problem List Items Addressed This Visit   Diabetes type 2, controlled - Primary     Chronic, stable. recheck A1c today.    Relevant Medications      quinapril-hydrochlorothiazide (ACCURETIC) 20-25 MG per tablet   Other Relevant Orders      Hemoglobin A1c      HM DIABETES FOOT EXAM (Completed)   HYPERLIPIDEMIA      Chronic, stable. Continue low dose lipitor.  Lab Results  Component Value Date   LDLCALC 94 05/25/2013      Relevant Medications       quinapril-hydrochlorothiazide (ACCURETIC) 20-25 MG per tablet   Alcohol abuse     Has decreased alcohol use by 1/2.      DEPRESSION     Chronic, stable. Continue sertraline 150mg  daily.        Follow up plan: Return in about 6 months (around 06/01/2014), or as needed, for annual exam, prior fasting for blood work.

## 2013-11-29 NOTE — Progress Notes (Signed)
Pre visit review using our clinic review tool, if applicable. No additional management support is needed unless otherwise documented below in the visit note. 

## 2013-11-30 ENCOUNTER — Encounter: Payer: Self-pay | Admitting: Gastroenterology

## 2013-11-30 ENCOUNTER — Telehealth: Payer: Self-pay | Admitting: Family Medicine

## 2013-11-30 ENCOUNTER — Encounter: Payer: Self-pay | Admitting: *Deleted

## 2013-11-30 NOTE — Telephone Encounter (Signed)
Relevant patient education assigned to patient using Emmi. ° °

## 2013-12-07 ENCOUNTER — Telehealth: Payer: Self-pay

## 2013-12-07 NOTE — Telephone Encounter (Signed)
Relevant patient education assigned to patient using Emmi. ° °

## 2014-02-21 ENCOUNTER — Ambulatory Visit (INDEPENDENT_AMBULATORY_CARE_PROVIDER_SITE_OTHER): Payer: Medicare Other | Admitting: Gastroenterology

## 2014-02-21 ENCOUNTER — Encounter: Payer: Self-pay | Admitting: Gastroenterology

## 2014-02-21 VITALS — Ht 69.0 in | Wt 139.0 lb

## 2014-02-21 DIAGNOSIS — R198 Other specified symptoms and signs involving the digestive system and abdomen: Secondary | ICD-10-CM

## 2014-02-21 DIAGNOSIS — R194 Change in bowel habit: Secondary | ICD-10-CM

## 2014-02-21 MED ORDER — MOVIPREP 100 G PO SOLR: 1.0000 | Freq: Once | ORAL | Status: DC

## 2014-02-21 NOTE — Patient Instructions (Signed)
You will be set up for a colonoscopy for polyp surveillance, minor change in bowels.

## 2014-02-21 NOTE — Progress Notes (Signed)
Review of pertinent gastrointestinal problems:  1. Adenomatous polyp 11/2008 colonsocopy; recall at 5 years 2. Gallstones: ERCP 11/2012 [name] for CBD stone on MRI and [place]; ERCP showed no stones but sphincterotomy performed.  Lap chol Dr. Rush Farmer 02/2013 with normal IOC.   HPI: This is a very pleasant 79 year old man whom I last saw about a year ago at the time of an ERCP done for bile duct stone, see above  No FH of colon cancer.  No Gi bleeding.  He has intermittent queezy, maybe associated with loose stools. He's been eating a lot more fruit.  Overall stable weight.    Stays active.    Past Medical History  Diagnosis Date  . Diabetes mellitus 4/02  . Hypertension 1996  . COPD (chronic obstructive pulmonary disease)   . Depression   . Hyperlipidemia   . BPH (benign prostatic hypertrophy)   . Pneumonia as a baby  . AAA (abdominal aortic aneurysm) 05/2012    mild 2.3 cm , rpt 1 year  . GERD (gastroesophageal reflux disease)   . Arthritis     fingers and hip  . 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 Choledocholithiasis 11/2012    s/p ERCP, s/p lap chole    Past Surgical History  Procedure Laterality Date  . Circumcision  10/30/09    Dr Gaynelle Arabian  . Colonoscopy  11/2008    2 small polyps (1 tubular), diverticulosis, lipoma, int/ext hemorrhoids [name])  . Endoscopic retrograde cholangiopancreatography (ercp) with propofol N/A 12/24/2012    Procedure: ENDOSCOPIC RETROGRADE CHOLANGIOPANCREATOGRAPHY (ERCP) WITH PROPOFOL;  Surgeon: Milus Banister, MD;  Location: [facility];  Service: Endoscopy;  Laterality: N/A;  . Laparoscopic cholecystectomy  02/2013    Dr. Ninfa Linden  . Cholecystectomy N/A 03/08/2013    Procedure: LAPAROSCOPIC CHOLECYSTECTOMY WITH INTRAOPERATIVE CHOLANGIOGRAM;  Surgeon: Harl Bowie, MD;  Location: [facility];  Service: General;  Laterality: N/A;    Current Outpatient Prescriptions  Medication Sig Dispense Refill  .  amLODipine (NORVASC) 10 MG tablet Take 1 tablet (10 mg total) by mouth daily before breakfast.  90 tablet  3  . aspirin 81 MG tablet Take 81 mg by mouth daily.        Marland Kitchen atorvastatin (LIPITOR) 10 MG tablet Take 0.5 tablets (5 mg total) by mouth every evening.  45 tablet  3  . finasteride (PROSCAR) 5 MG tablet Take 1 tablet (5 mg total) by mouth every evening.  90 tablet  3  . metFORMIN (GLUCOPHAGE) 500 MG tablet Take 1-2 tablets (500-1,000 mg total) by mouth 2 (two) times daily with a meal. Takes 1 tablet in the morning and 2 at night  270 tablet  3  . Multiple Vitamin (MULTIVITAMIN WITH MINERALS) TABS Take 1 tablet by mouth daily.      . quinapril (ACCUPRIL) 20 MG tablet Take 1 tablet (20 mg total) by mouth at bedtime.  90 tablet  3  . quinapril-hydrochlorothiazide (ACCURETIC) 20-25 MG per tablet Take 1 tablet by mouth daily.      . sertraline (ZOLOFT) 100 MG tablet Take 1.5 tablets (150 mg total) by mouth daily. Takes 1 and 1/2  90 tablet  3   No current facility-administered medications for this visit.    Allergies as of 02/21/2014  . (No Known Allergies)    Family History  Problem Relation Age of Onset  . Prostate cancer Father   . Aneurysm Father     aortic  .  Stroke Father   . Hodgkin's lymphoma Brother 51  . Prostate cancer Brother 37    1/2  . Prostate cancer Brother 49    1/2  . Other Sister     cerebral hemm  . Lupus Sister   . Colon cancer Neg Hx   . Diabetes Brother     History   Social History  . Marital Status: Single    Spouse Name: N/A    Number of Children: 1  . Years of Education: N/A   Occupational History  . [employer]     retired 1999   Social History Main Topics  . Smoking status: Former Smoker -- 2.00 packs/day for 40 years    Types: Cigarettes    Quit date: 07/29/1992  . Smokeless tobacco: Never Used  . Alcohol Use: Yes     Comment: Regular (sometimes daily-some days not at all)  . Drug Use: No  . Sexual Activity: Yes   Other  Topics Concern  . Not on file   Social History Narrative   Divorced since 1979   Lives alone   1 daughter 39, widowed      Advanced directives:  Daughter Kyrese Gartman is HCPOA, advanced directive in chart.      Physical Exam: Ht 5\' 9"  (1.753 m)  Wt 139 lb (63.05 kg)  BMI 20.52 kg/m2 Constitutional: generally well-appearing Psychiatric: alert and oriented x3 Abdomen: soft, nontender, nondistended, no obvious ascites, no peritoneal signs, normal bowel sounds     Assessment and plan: 79 y.o. male with personal history of adenomatous colon polyps, minor change in bowels over the past year or so  He does understand colon cancer screening, polyp surveillance generally stops between the ages of 57 and 66. He is in quite good health, stays very active. He has had minor change in his bowel suspect is related to changing diet and also perhaps having his gallbladder removed last year. I would like to see with colonoscopy at his soonest convenience for history of polyps.

## 2014-02-26 HISTORY — PX: COLONOSCOPY: SHX174

## 2014-02-28 ENCOUNTER — Ambulatory Visit (AMBULATORY_SURGERY_CENTER): Payer: Medicare Other | Admitting: Gastroenterology

## 2014-02-28 ENCOUNTER — Encounter: Payer: Self-pay | Admitting: Gastroenterology

## 2014-02-28 VITALS — BP 129/60 | HR 62 | Temp 98.0°F | Resp 24 | Ht 69.0 in | Wt 139.0 lb

## 2014-02-28 DIAGNOSIS — Z86010 Personal history of colonic polyps: Secondary | ICD-10-CM

## 2014-02-28 DIAGNOSIS — R198 Other specified symptoms and signs involving the digestive system and abdomen: Secondary | ICD-10-CM

## 2014-02-28 DIAGNOSIS — D126 Benign neoplasm of colon, unspecified: Secondary | ICD-10-CM

## 2014-02-28 DIAGNOSIS — K5730 Diverticulosis of large intestine without perforation or abscess without bleeding: Secondary | ICD-10-CM

## 2014-02-28 LAB — GLUCOSE, CAPILLARY
GLUCOSE-CAPILLARY: 134 mg/dL — AB (ref 70–99)
Glucose-Capillary: 117 mg/dL — ABNORMAL HIGH (ref 70–99)

## 2014-02-28 MED ORDER — SODIUM CHLORIDE 0.9 % IV SOLN: 500.0000 mL | INTRAVENOUS | Status: DC

## 2014-02-28 NOTE — Patient Instructions (Signed)
YOU HAD AN ENDOSCOPIC PROCEDURE TODAY AT THE [facility]: Refer to the procedure report that was given to you for any specific questions about what was found during the examination.  If the procedure report does not answer your questions, please call your gastroenterologist to clarify.  If you requested that your care partner not be given the details of your procedure findings, then the procedure report has been included in a sealed envelope for you to review at your convenience later.  YOU SHOULD EXPECT: Some feelings of bloating in the abdomen. Passage of more gas than usual.  Walking can help get rid of the air that was put into your GI tract during the procedure and reduce the bloating. If you had a lower endoscopy (such as a colonoscopy or flexible sigmoidoscopy) you may notice spotting of blood in your stool or on the toilet paper. If you underwent a bowel prep for your procedure, then you may not have a normal bowel movement for a few days.  DIET: Your first meal following the procedure should be a light meal and then it is ok to progress to your normal diet.  A half-sandwich or bowl of soup is an example of a good first meal.  Heavy or fried foods are harder to digest and may make you feel nauseous or bloated.  Likewise meals heavy in dairy and vegetables can cause extra gas to form and this can also increase the bloating.  Drink plenty of fluids but you should avoid alcoholic beverages for 24 hours.  ACTIVITY: Your care partner should take you home directly after the procedure.  You should plan to take it easy, moving slowly for the rest of the day.  You can resume normal activity the day after the procedure however you should NOT DRIVE or use heavy machinery for 24 hours (because of the sedation medicines used during the test).    SYMPTOMS TO REPORT IMMEDIATELY: A gastroenterologist can be reached at any hour.  During normal business hours, 8:30 AM to 5:00 PM Monday through Friday,  call [phone].  After hours and on weekends, please call the GI answering service at [phone] who will take a message and have the physician on call contact you.   Following lower endoscopy (colonoscopy or flexible sigmoidoscopy):  Excessive amounts of blood in the stool  Significant tenderness or worsening of abdominal pains  Swelling of the abdomen that is new, acute  Fever of 100F or higher    FOLLOW UP: If any biopsies were taken you will be contacted by phone or by letter within the next 1-3 weeks.  Call your gastroenterologist if you have not heard about the biopsies in 3 weeks.  Our staff will call the home number listed on your records the next business day following your procedure to check on you and address any questions or concerns that you may have at that time regarding the information given to you following your procedure. This is a courtesy call and so if there is no answer at the home number and we have not heard from you through the emergency physician on call, we will assume that you have returned to your regular daily activities without incident.  SIGNATURES/CONFIDENTIALITY: You and/or your care partner have signed paperwork which will be entered into your electronic medical record.  These signatures attest to the fact that that the information above on your After Visit Summary has been reviewed and is understood.  Full responsibility of the confidentiality   of this discharge information lies with you and/or your care-partner.     

## 2014-02-28 NOTE — Progress Notes (Signed)
Called to room to assist during endoscopic procedure.  Patient ID and intended procedure confirmed with present staff. Received instructions for my participation in the procedure from the performing physician.  

## 2014-02-28 NOTE — Op Note (Signed)
[facility]  [address] [place], [address]   COLONOSCOPY PROCEDURE REPORT  PATIENT: Dencil, Cayson  MR#: [id number] BIRTHDATE: Feb 09, 1935 , 72  yrs. old GENDER: Male ENDOSCOPIST: Milus Banister, MD PROCEDURE DATE:  02/28/2014 PROCEDURE:   Colonoscopy with snare polypectomy First Screening Colonoscopy - Avg.  risk and is 50 yrs.  old or older - No.  Prior Negative Screening - Now for repeat screening. N/A  History of Adenoma - Now for follow-up colonoscopy & has been > or = to 3 yrs.  Yes hx of adenoma.  Has been 3 or more years since last colonoscopy.  Polyps Removed Today? Yes. ASA CLASS:   Class II INDICATIONS:subcentimeter TA removed 2010, Dr.  [name]. MEDICATIONS: MAC sedation, administered by CRNA and propofol (Diprivan) 200mg  IV  DESCRIPTION OF PROCEDURE:   After the risks benefits and alternatives of the procedure were thoroughly explained, informed consent was obtained.  A digital rectal exam revealed no abnormalities of the rectum.   The LB [id number]  endoscope was introduced through the anus and advanced to the cecum, which was identified by both the appendix and ileocecal valve. No adverse events experienced.   The quality of the prep was excellent.  The instrument was then slowly withdrawn as the colon was fully examined.  COLON FINDINGS: One small polyp was found, removed and sent to pathology.  This was 78mm across, located in transverse segment, removed with cold snare, sent to pathology.  There were several diverticulum in the left colon.  The examination was otherwise normal.  Retroflexed views revealed no abnormalities. The time to cecum=2 minutes 53 seconds.  Withdrawal time=6 minutes 10 seconds. The scope was withdrawn and the procedure completed. COMPLICATIONS: There were no complications.  ENDOSCOPIC IMPRESSION: One small polyp was found, removed and sent to pathology.There were several diverticulum in the left colon.  The  examination was otherwise normal.  RECOMMENDATIONS: Given your age, you will not need another colonoscopy for colon cancer screening or polyp surveillance even if the small polyp removed today was pre-cancerous.  These types of tests usually stop around the age 53. You will receive a letter within 1-2 weeks with the results of your biopsy as well as final recommendations. Please call my office if you have not received a letter after 3 weeks.   eSigned:  Milus Banister, MD 02/28/2014 11:10 AM   cc: Emi Holes, MD

## 2014-02-28 NOTE — Progress Notes (Signed)
Procedure ends, to recovery, report given and VSS. 

## 2014-03-01 ENCOUNTER — Other Ambulatory Visit (HOSPITAL_COMMUNITY): Payer: Self-pay | Admitting: *Deleted

## 2014-03-01 ENCOUNTER — Telehealth: Payer: Self-pay | Admitting: *Deleted

## 2014-03-01 DIAGNOSIS — I714 Abdominal aortic aneurysm, without rupture, unspecified: Secondary | ICD-10-CM

## 2014-03-01 NOTE — Telephone Encounter (Signed)
  Follow up Call-  Call back number 02/28/2014  Post procedure Call Back phone  # [phone]  Permission to leave phone message Yes     Patient questions:  Do you have a fever, pain , or abdominal swelling? No. Pain Score  0 *  Have you tolerated food without any problems? Yes.    Have you been able to return to your normal activities? Yes.    Do you have any questions about your discharge instructions: Diet   No. Medications  No. Follow up visit  No.  Do you have questions or concerns about your Care? No.  Actions: * If pain score is 4 or above: No action needed, pain <4.

## 2014-03-04 ENCOUNTER — Encounter: Payer: Self-pay | Admitting: Gastroenterology

## 2014-03-08 ENCOUNTER — Encounter (HOSPITAL_BASED_OUTPATIENT_CLINIC_OR_DEPARTMENT_OTHER): Payer: Medicare Other

## 2014-03-08 DIAGNOSIS — I714 Abdominal aortic aneurysm, without rupture, unspecified: Secondary | ICD-10-CM

## 2014-03-08 DIAGNOSIS — I700 Atherosclerosis of aorta: Secondary | ICD-10-CM

## 2014-05-25 ENCOUNTER — Encounter: Payer: Self-pay | Admitting: Family Medicine

## 2014-05-25 ENCOUNTER — Other Ambulatory Visit: Payer: Self-pay | Admitting: Family Medicine

## 2014-05-25 DIAGNOSIS — N400 Enlarged prostate without lower urinary tract symptoms: Secondary | ICD-10-CM

## 2014-05-25 DIAGNOSIS — E785 Hyperlipidemia, unspecified: Secondary | ICD-10-CM

## 2014-05-25 DIAGNOSIS — I10 Essential (primary) hypertension: Secondary | ICD-10-CM

## 2014-05-25 DIAGNOSIS — E119 Type 2 diabetes mellitus without complications: Secondary | ICD-10-CM

## 2014-05-26 ENCOUNTER — Other Ambulatory Visit: Payer: Medicare Other

## 2014-05-27 ENCOUNTER — Other Ambulatory Visit (INDEPENDENT_AMBULATORY_CARE_PROVIDER_SITE_OTHER): Payer: Medicare Other

## 2014-05-27 ENCOUNTER — Other Ambulatory Visit: Payer: Medicare Other

## 2014-05-27 DIAGNOSIS — E119 Type 2 diabetes mellitus without complications: Secondary | ICD-10-CM

## 2014-05-27 DIAGNOSIS — N400 Enlarged prostate without lower urinary tract symptoms: Secondary | ICD-10-CM

## 2014-05-27 DIAGNOSIS — I10 Essential (primary) hypertension: Secondary | ICD-10-CM

## 2014-05-27 DIAGNOSIS — E785 Hyperlipidemia, unspecified: Secondary | ICD-10-CM

## 2014-05-27 LAB — COMPREHENSIVE METABOLIC PANEL
ALT: 138 U/L — ABNORMAL HIGH (ref 0–53)
AST: 63 U/L — ABNORMAL HIGH (ref 0–37)
Albumin: 3.8 g/dL (ref 3.5–5.2)
Alkaline Phosphatase: 150 U/L — ABNORMAL HIGH (ref 39–117)
BUN: 15 mg/dL (ref 6–23)
CO2: 25 meq/L (ref 19–32)
Calcium: 9.7 mg/dL (ref 8.4–10.5)
Chloride: 101 mEq/L (ref 96–112)
Creatinine, Ser: 0.9 mg/dL (ref 0.4–1.5)
GFR: 91.66 mL/min (ref >60.00)
Glucose, Bld: 94 mg/dL (ref 70–99)
POTASSIUM: 4.9 meq/L (ref 3.5–5.1)
Sodium: 138 mEq/L (ref 135–145)
TOTAL PROTEIN: 6.8 g/dL (ref 6.0–8.3)
Total Bilirubin: 0.9 mg/dL (ref 0.2–1.2)

## 2014-05-27 LAB — LIPID PANEL
Cholesterol: 135 mg/dL (ref 0–200)
HDL: 70.2 mg/dL (ref >39.00)
LDL Cholesterol: 53 mg/dL (ref 0–99)
NonHDL: 64.8
Total CHOL/HDL Ratio: 2
Triglycerides: 60 mg/dL (ref 0.0–149.0)
VLDL: 12 mg/dL (ref 0.0–40.0)

## 2014-05-27 LAB — HEMOGLOBIN A1C: Hgb A1c MFr Bld: 6.3 % (ref 4.6–6.5)

## 2014-05-27 LAB — PSA: PSA: 0.57 ng/mL (ref 0.10–4.00)

## 2014-06-01 ENCOUNTER — Emergency Department (INDEPENDENT_AMBULATORY_CARE_PROVIDER_SITE_OTHER)
Admission: EM | Admit: 2014-06-01 | Discharge: 2014-06-01 | Disposition: A | Discharge status: Home / Self Care | Payer: Medicare Other | Source: Home / Self Care

## 2014-06-01 ENCOUNTER — Encounter (HOSPITAL_COMMUNITY): Payer: Self-pay | Admitting: Emergency Medicine

## 2014-06-01 DIAGNOSIS — G5632 Lesion of radial nerve, left upper limb: Secondary | ICD-10-CM

## 2014-06-01 NOTE — Discharge Instructions (Signed)
Radial Nerve Palsy Wrist drop is also known as radial nerve palsy. It is a condition in which you can not extend your wrist. This means if you are standing with your elbow bent at a right angle and with the top of your hand pointed at the ceiling, you can not hold your hand up. It falls toward the floor.  This action of extending your wrist is caused by the muscles in the back of your arm. These muscles are controlled by the radial nerve. This means that anything affecting the radial nerve so it can not tell the muscles how to work will cause wrist drop. This is medically called radial nerve palsy. Also the radial nerve is a motor and sensory nerve so anything affecting it causes problems with movement and feeling. CAUSES  Some more common causes of wrist drop are:  A break (fracture) of the large bone in the arm between your shoulder and your elbow (humerus). This is because the radial nerve winds around the humerus.  Improper use of crutches causes this because the radial nerve runs through the armpit (axilla). Crutches which are too long can put pressure on the nerve. This is sometimes called crutch palsy.  Falling asleep with your arm over a chair and supported on the back is a common cause. This is sometimes called Saturday Night Syndrome.  Wrist drop can be associated with lead poisoning because of the effect of lead on the radial nerve. SYMPTOMS  The wrist drop is an obvious problem, but there may also be numbness in the back of the arm, forearm or hand which provides feeling in these areas by the radial nerve. There can be difficulty straightening out the elbow in addition to the wrist. There may be numbness, tingling, pain, burning sensations or other abnormal feelings. Symptoms depend entirely on where the radial nerve is injured. DIAGNOSIS   Wrist drop is obvious just by looking at it. Your caregiver may make the diagnosis by taking your history and doing a couple tests.  One test which  may be done is a nerve conduction study. This test shows if the radial nerve is conducting signals well. If not, it can determine where the nerve problem is.  Sometimes X-ray studies are done. Your caregiver will determine if further testing needs to be done. TREATMENT   Usually if the problem is found to be pressure on the nerve, simply removing the pressure will allow the nerve to go back to normal in a few weeks to a few months. Other treatments will depend upon the cause found.  Only take over-the-counter or prescription medicines for pain, discomfort, or fever as directed by your caregiver.  Sometimes seizure medications are used.  Steroids are sometimes given to decrease swelling if it is thought to be a possible cause. Document Released: 03/21/2006 Document Revised: 10/07/2011 Document Reviewed: 09/29/2013 ExitCare Patient Information 2015 [employer]. This information is not intended to replace advice given to you by your health care provider. Make sure you discuss any questions you have with your health care provider.  

## 2014-06-01 NOTE — ED Provider Notes (Signed)
Chief Complaint   Hand Problem    History of Present Illness   Justin Benson is a 79 year old gentleman who fell asleep watching the [employer] football game, Monday night, 3 days ago. He was sitting in a chair that had wooden arms. When he awoke he noted inability to extend the left wrist and numbness over the dorsum of the wrist. He denies any pain in the neck, axilla, arm. He has good movement of the shoulder and elbow. He denies any headache, visual symptoms, neck pain, stiff neck, or weakness or numbness of the face or leg. He has no difficulty speaking or walking. The patient states he had something like this happen before which sounds like it involved his ulnar nerve on the same arm. He is a diabetic, his blood sugars are well controlled on metformin.  Review of Systems   Other than as noted above, the patient denies any of the following symptoms: Systemic:  No fever, chills, photophobia, stiff neck. Eye:  No blurred vision or diplopia. Cardiac:  No chest pain, shortness of breath, palpitations, or syncope.  Neuro:  No paresthesias, loss of consciousness, seizure activity, muscle weakness, trouble with coordination or gait, trouble speaking or swallowing. Psych:  No depression, anxiety or trouble sleeping.  [place]   Past medical history, family history, social history, meds, and allergies were reviewed.  He has no known medication allergies. He has a history of high blood pressure, hypercholesterolemia, diabetes, COPD, and depression. Current meds include amlodipine, aspirin, Lipitor, metformin, Zoloft, Proscar, and Accupril.  Physical Examination     Vital signs:  There were no vitals taken for this visit. General:  Alert and oriented.  In no distress. Eye:  Lids and conjunctivas normal.  PERRL,  Full EOMs.  Fundi benign with normal discs and vessels. ENT:  No cranial or facial tenderness to palpation.  TMs and canals clear.  Nasal mucosa was normal and uncongested without any  drainage. No intra oral lesions, pharynx clear, mucous membranes moist, dentition normal. Neck:  Supple, full ROM, no tenderness to palpation.  No adenopathy or mass. No carotid bruit. Lungs: Clear to auscultation. Heart: Regular rhythm, no gallop or murmur. Neuro:  Alert and orented times 3.  Speech was clear, fluent, and appropriate.  Cranial nerves intact. No pronator drift, he is unable to dorsiflex his left wrist at all and he has a wrist drop. He has normal function of median and ulnar nerves in that hand, and normal function in the opposite hand. Finger to nose normal. Sensation to light touch was intact throughout.  DTRs were 2+ and symmetrical.Station and gait were normal.  Romberg's sign was normal.  Able to perform tandem gait well. Psych:  Normal affect.   Course in Urgent [place]   He was given a wrist splint.  Assessment   The encounter diagnosis was Radial nerve palsy, left.   Differential diagnosis is pressure due to falling asleep and a chair, or diabetic mononeuropathy.  Plan   1.  Meds:  The following meds were prescribed:   Discharge Medication List as of 06/01/2014  1:18 PM      2.  Patient Education/Counseling:  The patient was given appropriate handouts, self care instructions, and instructed in symptomatic relief.    3.  Follow up:  The patient was told to follow up here if no better in 3 to 4 days, or sooner if becoming worse in any way, and given some red flag symptoms such as fever, severe headache,  persistent vomiting or new or changing neurological symptoms which would prompt immediate return.  Follow-up with his primary care physician next couple of days.      Harden Mo, MD 06/01/14 1344

## 2014-06-01 NOTE — ED Notes (Addendum)
States he fell asleep 11-2 while seated in arm chair watching television. Woke ~2 AM 11-3, and got up to go to bed. When woke following AM, had problem w sensation , ROM, function left hand/wrist . NAD. Denies other issues

## 2014-06-02 ENCOUNTER — Ambulatory Visit (INDEPENDENT_AMBULATORY_CARE_PROVIDER_SITE_OTHER): Payer: Medicare Other | Admitting: Family Medicine

## 2014-06-02 ENCOUNTER — Encounter: Payer: Self-pay | Admitting: Family Medicine

## 2014-06-02 VITALS — BP 130/68 | HR 62 | Temp 97.8°F | Resp 14 | Ht 69.5 in | Wt 136.2 lb

## 2014-06-02 DIAGNOSIS — R740 Nonspecific elevation of levels of transaminase and lactic acid dehydrogenase [LDH]: Secondary | ICD-10-CM

## 2014-06-02 DIAGNOSIS — F1010 Alcohol abuse, uncomplicated: Secondary | ICD-10-CM

## 2014-06-02 DIAGNOSIS — G5630 Lesion of radial nerve, unspecified upper limb: Secondary | ICD-10-CM | POA: Insufficient documentation

## 2014-06-02 DIAGNOSIS — Z79899 Other long term (current) drug therapy: Secondary | ICD-10-CM

## 2014-06-02 DIAGNOSIS — Z87891 Personal history of nicotine dependence: Secondary | ICD-10-CM

## 2014-06-02 DIAGNOSIS — E785 Hyperlipidemia, unspecified: Secondary | ICD-10-CM

## 2014-06-02 DIAGNOSIS — R7401 Elevation of levels of liver transaminase levels: Secondary | ICD-10-CM

## 2014-06-02 DIAGNOSIS — Z7189 Other specified counseling: Secondary | ICD-10-CM

## 2014-06-02 DIAGNOSIS — Z0000 Encounter for general adult medical examination without abnormal findings: Secondary | ICD-10-CM

## 2014-06-02 DIAGNOSIS — E119 Type 2 diabetes mellitus without complications: Secondary | ICD-10-CM

## 2014-06-02 DIAGNOSIS — N400 Enlarged prostate without lower urinary tract symptoms: Secondary | ICD-10-CM

## 2014-06-02 DIAGNOSIS — K7010 Alcoholic hepatitis without ascites: Secondary | ICD-10-CM

## 2014-06-02 DIAGNOSIS — F329 Major depressive disorder, single episode, unspecified: Secondary | ICD-10-CM

## 2014-06-02 DIAGNOSIS — Z23 Encounter for immunization: Secondary | ICD-10-CM

## 2014-06-02 DIAGNOSIS — I10 Essential (primary) hypertension: Secondary | ICD-10-CM

## 2014-06-02 DIAGNOSIS — F32A Depression, unspecified: Secondary | ICD-10-CM

## 2014-06-02 DIAGNOSIS — G5632 Lesion of radial nerve, left upper limb: Secondary | ICD-10-CM

## 2014-06-02 HISTORY — DX: Alcoholic hepatitis without ascites: K70.10

## 2014-06-02 HISTORY — DX: Lesion of radial nerve, unspecified upper limb: G56.30

## 2014-06-02 LAB — VITAMIN B12: VITAMIN B 12: 538 pg/mL (ref 211–911)

## 2014-06-02 LAB — FOLATE: Folate: >24.5 ng/mL (ref >5.9)

## 2014-06-02 MED ORDER — ATORVASTATIN CALCIUM 10 MG PO TABS: 5.0000 mg | ORAL_TABLET | Freq: Every evening | ORAL | Status: DC

## 2014-06-02 MED ORDER — QUINAPRIL HCL 20 MG PO TABS: 20.0000 mg | ORAL_TABLET | Freq: Every day | ORAL | Status: DC

## 2014-06-02 MED ORDER — METFORMIN HCL 500 MG PO TABS: 500.0000 mg | ORAL_TABLET | Freq: Two times a day (BID) | ORAL | Status: DC

## 2014-06-02 MED ORDER — QUINAPRIL-HYDROCHLOROTHIAZIDE 20-25 MG PO TABS: 1.0000 | ORAL_TABLET | Freq: Every day | ORAL | Status: DC

## 2014-06-02 MED ORDER — AMLODIPINE BESYLATE 10 MG PO TABS: 10.0000 mg | ORAL_TABLET | Freq: Every day | ORAL | Status: DC

## 2014-06-02 MED ORDER — SERTRALINE HCL 100 MG PO TABS: 150.0000 mg | ORAL_TABLET | Freq: Every day | ORAL | Status: DC

## 2014-06-02 MED ORDER — FINASTERIDE 5 MG PO TABS: 5.0000 mg | ORAL_TABLET | Freq: Every evening | ORAL | Status: DC

## 2014-06-02 NOTE — Assessment & Plan Note (Addendum)
Slowly resolving. Discussed hand rehab exercise with rubber ball. Update if not improving as expected. Continue MVI.

## 2014-06-02 NOTE — Assessment & Plan Note (Signed)
Chronic, stable. Decrease metformin to 500mg  bid.

## 2014-06-02 NOTE — Progress Notes (Signed)
Pre visit review using our clinic review tool, if applicable. No additional management support is needed unless otherwise documented below in the visit note. 

## 2014-06-02 NOTE — Assessment & Plan Note (Signed)
New finding - in h/o habitual alcohol use will check viral hep panel and abd [place] to further evaluate liver. Pt agrees with plan.

## 2014-06-02 NOTE — Assessment & Plan Note (Signed)
Chronic, stable. Continue finasteride.  

## 2014-06-02 NOTE — Patient Instructions (Addendum)
Flu shot today, prevnar today. Use squeeze ball to help rehab L hand for radial nerve palsy. Blood work and ultrasound today to follow up elevated liver functions found today. Return as needed or in 6 months for diabetes follow up. May decrease metformin to 500mg  one tablet twice daily. Good to see you today, call us with questions.

## 2014-06-02 NOTE — Assessment & Plan Note (Signed)
Chronic, stable. Continue regimen. 

## 2014-06-02 NOTE — Addendum Note (Signed)
Addended by: Geni Bers on: 06/02/2014 10:53 AM   Modules accepted: Orders

## 2014-06-02 NOTE — Progress Notes (Signed)
BP 130/68 mmHg  Pulse 62  Temp(Src) 97.8 F (36.6 C) (Oral)  Resp 14  SpO2 99%   CC: medicare wellness visit  Subjective:    Patient ID: Justin Benson, male    DOB: August 25, 1934, 79 y.o.   MRN: [id number]  HPI: Justin Benson is a 79 y.o. male presenting on 06/02/2014 for Annual Exam   Recent radial nerve palsy evaluated by [facility] yesterday, slowly recovering strength/sensation and function. Mild residual numbness at R thumb. EtOH contributing. No stroke sxs.   Tranasminitis - found today. H/o habitual alcohol use, but has cut back in last 2-3 weeks. No h/o hepatitis in past.  Wt Readings from Last 3 Encounters:  02/28/14 139 lb (63.05 kg)  02/21/14 139 lb (63.05 kg)  11/29/13 146 lb 8 oz (66.452 kg)   There is no weight on file to calculate BMI.  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 Pt denies any sxs. States released from nsg care.  EtOH - no alcohol in last 2-3 weeks. Stopped suddenly without withdrawal sxs. Over summer did worse - habitual drinking 6 pack per day.  No results found for: VITAMINB12  No results found for: FOLATE    Passes hearing screen. Vision screen per eye doctor  No falls in last year  Denies depression/anhedonia, sadness   Preventative: Prostate cancer screening - strong fmhx prostate cancer. Personal h/o BPH on finasteride. COLONOSCOPY Date: 02/2014 1 polyp, diverticulosis, no rpt needed Justin Benson) Flu shot today Td 2011  Pneumovax 2004, prevnar today zostavax 2012  Advanced directives: has copy of this at home. HCPOA would be daughter, Justin Benson in [place]. Would be ok with temporary measures - feeding tube, breathing tube, but doesn't want prolonged life support  Does crossword puzzles, reads.  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  Relevant past medical, surgical, family and social history reviewed and updated as indicated.  Allergies and medications reviewed and updated. Current Outpatient Prescriptions on File Prior to Visit  Medication Sig  . aspirin 81 MG tablet Take 81 mg by mouth daily.    . Multiple Vitamin (MULTIVITAMIN WITH MINERALS) TABS Take 1 tablet by mouth daily.   No current facility-administered medications on file prior to visit.    Review of Systems  Constitutional: Negative for fever, chills, activity change, appetite change, fatigue and unexpected weight change.  HENT: Negative for hearing loss.   Eyes: Negative for visual disturbance.  Respiratory: Negative for cough, chest tightness, shortness of breath and wheezing.   Cardiovascular: Negative for chest pain, palpitations and leg swelling.  Gastrointestinal: Negative for nausea, vomiting, abdominal pain, diarrhea, constipation, blood in stool and abdominal distention.  Genitourinary: Negative for hematuria and difficulty urinating.  Musculoskeletal: Negative for myalgias, arthralgias and neck pain.  Skin: Negative for rash.  Neurological: Negative for dizziness, seizures, syncope and headaches.  Hematological: Negative for adenopathy. Does not bruise/bleed easily.  Psychiatric/Behavioral: Positive for dysphoric mood. The patient is not nervous/anxious.    Per HPI unless specifically indicated above    Objective:    BP 130/68 mmHg  Pulse 62  Temp(Src) 97.8 F (36.6 C) (Oral)  Resp 14  SpO2 99%  Physical Exam  Constitutional: He is oriented to person, place, and time. He appears well-developed and well-nourished. No distress.  HENT:  Head: Normocephalic and atraumatic.  Right Ear: Hearing, tympanic membrane, external ear and ear canal normal.  Left Ear: Hearing, tympanic membrane, external ear and ear canal normal.  Nose: Nose normal.  Mouth/Throat: Uvula  is midline, oropharynx is clear and moist and mucous membranes are normal. No oropharyngeal exudate, posterior oropharyngeal edema or posterior oropharyngeal erythema.  Eyes: Conjunctivae and EOM are normal. Pupils are equal, round, and reactive to light. No scleral icterus.  Neck: Normal range of motion. Neck supple. Carotid bruit is not present. No thyromegaly present.  Cardiovascular: Normal rate, regular rhythm, normal heart sounds and intact distal pulses.   No murmur heard. Pulses:      Radial pulses are 2+ on the right side, and 2+ on the left side.  Pulmonary/Chest: Effort normal and breath sounds normal. No respiratory distress. He has no wheezes. He has no rales.  Abdominal: Soft. Normal appearance and bowel sounds are normal. He exhibits no distension and no mass. There is no hepatosplenomegaly. There is no tenderness. There is no rigidity, no rebound, no guarding, no CVA tenderness and negative Murphy's sign.  Genitourinary: Rectum normal and prostate normal. Rectal exam shows no external hemorrhoid, no fissure, no mass, no tenderness and anal tone normal. Guaiac negative stool. Prostate is not enlarged (20gm) and not tender.  Musculoskeletal: Normal range of motion. He exhibits no edema.  R hand in wrist brace with noted weakness to grip grasp but sensation intact.  Lymphadenopathy:    He has no cervical adenopathy.  Neurological: He is alert and oriented to person, place, and time.  CN grossly intact, station and gait intact  Skin: Skin is warm and dry. No rash noted.  Some telangectasias of face  Psychiatric: He has a normal mood and affect. His behavior is normal. Judgment and thought content normal.  Nursing note and vitals reviewed.  Results for orders placed or performed in visit on 05/27/14  Lipid panel  Result Value Ref Range   Cholesterol 135 0 - 200 mg/dL   Triglycerides 60.0 0.0 - 149.0 mg/dL   HDL 70.20 >39.00 mg/dL   VLDL 12.0 0.0 - 40.0 mg/dL   LDL Cholesterol  53 0 - 99 mg/dL   Total CHOL/HDL Ratio 2    NonHDL 64.80   Comprehensive metabolic panel  Result Value Ref Range   Sodium 138 135 - 145 mEq/L   Potassium 4.9 3.5 - 5.1 mEq/L   Chloride 101 96 - 112 mEq/L   CO2 25 19 - 32 mEq/L   Glucose, Bld 94 70 - 99 mg/dL   BUN 15 6 - 23 mg/dL   Creatinine, Ser 0.9 0.4 - 1.5 mg/dL   Total Bilirubin 0.9 0.2 - 1.2 mg/dL   Alkaline Phosphatase 150 (H) 39 - 117 U/L   AST 63 (H) 0 - 37 U/L   ALT 138 (H) 0 - 53 U/L   Total Protein 6.8 6.0 - 8.3 g/dL   Albumin 3.8 3.5 - 5.2 g/dL   Calcium 9.7 8.4 - 10.5 mg/dL   GFR 91.66 >60.00 mL/min  Hemoglobin A1c  Result Value Ref Range   Hgb A1c MFr Bld 6.3 4.6 - 6.5 %  PSA  Result Value Ref Range   PSA 0.57 0.10 - 4.00 ng/mL      Assessment & Plan:   Problem List Items Addressed This Visit    Transaminitis    New finding - in h/o habitual alcohol use will check viral hep panel and abd [place] to further evaluate liver.  Pt agrees with plan.    Relevant Orders      US Abdomen Complete      Hepatitis panel, acute   Radial nerve palsy    Slowly resolving. Discussed hand rehab exercise with rubber ball. Update if not improving as expected. Continue MVI.    Relevant Medications      sertraline (ZOLOFT) tablet   Medicare annual wellness visit, subsequent - Primary    I have personally reviewed the Medicare Annual Wellness questionnaire and have noted 1. The patient's medical and social history 2. Their use of alcohol, tobacco or illicit drugs 3. Their current medications and supplements 4. The patient's functional ability including ADL's, fall risks, home safety risks and hearing or visual impairment. 5. Diet and physical activity 6. Evidence for depression or mood disorders The patients weight, height, BMI have been recorded in the chart.  Hearing and vision has been addressed. I have made referrals, counseling and provided education to the patient based review of the above and I have provided the pt with a  written personalized care plan for preventive services. Provider list updated - see scanned questionairre.  Reviewed preventative protocols and updated unless pt declined.    HLD (hyperlipidemia)    Chronic, stable. Continue low dose lipitor.    Relevant Medications      amLODIpine (NORVASC) tablet      quinapril (ACCUPRIL) tablet      quinapril-hydrochlorothiazide (ACCURETIC) 20-25 MG per tablet      atorvastatin (LIPITOR) tablet   Health maintenance examination   Ex-smoker    60 PY hx, quit 1995    Essential hypertension    Chronic, stable. Continue regimen.    Relevant Medications      amLODIpine (NORVASC) tablet      quinapril (ACCUPRIL) tablet      quinapril-hydrochlorothiazide (ACCURETIC) 20-25 MG per tablet      atorvastatin (LIPITOR) tablet   Diabetes type 2, controlled    Chronic, stable. Decrease metformin to 500mg  bid.    Relevant Medications      metFORMIN (GLUCOPHAGE) tablet      quinapril (ACCUPRIL) tablet      quinapril-hydrochlorothiazide (ACCURETIC) 20-25 MG per tablet      atorvastatin (LIPITOR) tablet   Depression    Chronic, stable. Continue sertraline regimen.    Relevant Medications      sertraline (ZOLOFT) tablet   BPH (benign prostatic hypertrophy)    Chronic, stable. Continue finasteride.    Relevant Medications      finasteride (PROSCAR) 5 MG tablet   Alcohol abuse    Continued habitual use, but has abstained for last 2-3 wks. Discussed relation of depression with alcohol use. Check B12 and folate and thiamine today. Continue MVI.    Relevant Orders      US Abdomen Complete      Vitamin B12      Folate      Vitamin B1   Advanced care planning/counseling discussion    Advanced directives: has copy of this at home. HCPOA would be daughter, Justin Benson in [place]. Would be ok with temporary measures - feeding tube, breathing tube, but doesn't want prolonged life support Advanced directive in chart (11/2013)        Follow up  plan: No Follow-up on file.

## 2014-06-02 NOTE — Assessment & Plan Note (Signed)
60 PY hx, quit 1995

## 2014-06-02 NOTE — Assessment & Plan Note (Signed)
Continued habitual use, but has abstained for last 2-3 wks. Discussed relation of depression with alcohol use. Check B12 and folate and thiamine today. Continue MVI.

## 2014-06-02 NOTE — Assessment & Plan Note (Signed)
Chronic, stable. Continue low dose lipitor.  

## 2014-06-02 NOTE — Assessment & Plan Note (Signed)

## 2014-06-02 NOTE — Assessment & Plan Note (Signed)
Advanced directives: has copy of this at home. HCPOA would be daughter, Justin Benson in [place]. Would be ok with temporary measures - feeding tube, breathing tube, but doesn't want prolonged life support Advanced directive in chart (11/2013)

## 2014-06-02 NOTE — Assessment & Plan Note (Signed)
Chronic, stable. Continue sertraline regimen.

## 2014-06-03 ENCOUNTER — Encounter: Payer: Self-pay | Admitting: Family Medicine

## 2014-06-03 ENCOUNTER — Ambulatory Visit: Payer: Self-pay | Admitting: Family Medicine

## 2014-06-03 LAB — HEPATITIS PANEL, ACUTE
HCV Ab: NEGATIVE
Hep A IgM: NONREACTIVE
Hep B C IgM: NONREACTIVE
Hepatitis B Surface Ag: NEGATIVE

## 2014-06-05 ENCOUNTER — Encounter: Payer: Self-pay | Admitting: Family Medicine

## 2014-06-05 DIAGNOSIS — I700 Atherosclerosis of aorta: Secondary | ICD-10-CM | POA: Insufficient documentation

## 2014-06-08 LAB — VITAMIN B1: VITAMIN B1 (THIAMINE): 23 nmol/L (ref 8–30)

## 2014-09-01 ENCOUNTER — Other Ambulatory Visit: Payer: Self-pay | Admitting: Family Medicine

## 2014-09-01 DIAGNOSIS — R7401 Elevation of levels of liver transaminase levels: Secondary | ICD-10-CM

## 2014-09-01 DIAGNOSIS — E119 Type 2 diabetes mellitus without complications: Secondary | ICD-10-CM

## 2014-09-01 DIAGNOSIS — R740 Nonspecific elevation of levels of transaminase and lactic acid dehydrogenase [LDH]: Principal | ICD-10-CM

## 2014-09-02 ENCOUNTER — Other Ambulatory Visit (INDEPENDENT_AMBULATORY_CARE_PROVIDER_SITE_OTHER): Payer: Medicare Other

## 2014-09-02 DIAGNOSIS — I10 Essential (primary) hypertension: Secondary | ICD-10-CM

## 2014-09-02 DIAGNOSIS — R740 Nonspecific elevation of levels of transaminase and lactic acid dehydrogenase [LDH]: Secondary | ICD-10-CM

## 2014-09-02 DIAGNOSIS — E785 Hyperlipidemia, unspecified: Secondary | ICD-10-CM

## 2014-09-02 DIAGNOSIS — E119 Type 2 diabetes mellitus without complications: Secondary | ICD-10-CM

## 2014-09-02 DIAGNOSIS — R7401 Elevation of levels of liver transaminase levels: Secondary | ICD-10-CM

## 2014-09-02 LAB — HEPATIC FUNCTION PANEL
ALK PHOS: 135 U/L — AB (ref 39–117)
ALT: 236 U/L — ABNORMAL HIGH (ref 0–53)
AST: 207 U/L — ABNORMAL HIGH (ref 0–37)
Albumin: 4.5 g/dL (ref 3.5–5.2)
BILIRUBIN DIRECT: 0.2 mg/dL (ref 0.0–0.3)
BILIRUBIN TOTAL: 0.6 mg/dL (ref 0.2–1.2)
TOTAL PROTEIN: 7.2 g/dL (ref 6.0–8.3)

## 2014-09-02 LAB — TSH: TSH: 4.54 u[IU]/mL — ABNORMAL HIGH (ref 0.35–4.50)

## 2014-09-02 LAB — HEMOGLOBIN A1C: Hgb A1c MFr Bld: 6.9 % — ABNORMAL HIGH (ref 4.6–6.5)

## 2014-09-07 ENCOUNTER — Encounter: Payer: Self-pay | Admitting: Family Medicine

## 2014-12-06 ENCOUNTER — Ambulatory Visit (INDEPENDENT_AMBULATORY_CARE_PROVIDER_SITE_OTHER): Payer: Medicare Other | Admitting: Family Medicine

## 2014-12-06 ENCOUNTER — Encounter: Payer: Self-pay | Admitting: Family Medicine

## 2014-12-06 VITALS — BP 136/60 | HR 68 | Temp 98.0°F | Wt 137.5 lb

## 2014-12-06 DIAGNOSIS — E785 Hyperlipidemia, unspecified: Secondary | ICD-10-CM | POA: Diagnosis not present

## 2014-12-06 DIAGNOSIS — R740 Nonspecific elevation of levels of transaminase and lactic acid dehydrogenase [LDH]: Secondary | ICD-10-CM | POA: Diagnosis not present

## 2014-12-06 DIAGNOSIS — I10 Essential (primary) hypertension: Secondary | ICD-10-CM

## 2014-12-06 DIAGNOSIS — E119 Type 2 diabetes mellitus without complications: Secondary | ICD-10-CM

## 2014-12-06 DIAGNOSIS — F32A Depression, unspecified: Secondary | ICD-10-CM

## 2014-12-06 DIAGNOSIS — R7401 Elevation of levels of liver transaminase levels: Secondary | ICD-10-CM

## 2014-12-06 DIAGNOSIS — F1010 Alcohol abuse, uncomplicated: Secondary | ICD-10-CM

## 2014-12-06 DIAGNOSIS — F329 Major depressive disorder, single episode, unspecified: Secondary | ICD-10-CM

## 2014-12-06 LAB — COMPREHENSIVE METABOLIC PANEL
ALT: 102 U/L — AB (ref 0–53)
AST: 73 U/L — AB (ref 0–37)
Albumin: 4.3 g/dL (ref 3.5–5.2)
Alkaline Phosphatase: 132 U/L — ABNORMAL HIGH (ref 39–117)
BUN: 16 mg/dL (ref 6–23)
CHLORIDE: 102 meq/L (ref 96–112)
CO2: 31 meq/L (ref 19–32)
Calcium: 10 mg/dL (ref 8.4–10.5)
Creatinine, Ser: 0.77 mg/dL (ref 0.40–1.50)
GFR: 103.99 mL/min (ref >60.00)
GLUCOSE: 106 mg/dL — AB (ref 70–99)
Potassium: 4.5 mEq/L (ref 3.5–5.1)
Sodium: 138 mEq/L (ref 135–145)
Total Bilirubin: 0.4 mg/dL (ref 0.2–1.2)
Total Protein: 6.9 g/dL (ref 6.0–8.3)

## 2014-12-06 LAB — CK: Total CK: 50 U/L (ref 7–232)

## 2014-12-06 LAB — HEMOGLOBIN A1C: Hgb A1c MFr Bld: 6.6 % — ABNORMAL HIGH (ref 4.6–6.5)

## 2014-12-06 LAB — T4, FREE: FREE T4: 0.86 ng/dL (ref 0.60–1.60)

## 2014-12-06 LAB — TSH: TSH: 2.86 u[IU]/mL (ref 0.35–4.50)

## 2014-12-06 NOTE — Progress Notes (Signed)
Pre visit review using our clinic review tool, if applicable. No additional management support is needed unless otherwise documented below in the visit note. 

## 2014-12-06 NOTE — Assessment & Plan Note (Signed)
Stable on sertraline - continue.

## 2014-12-06 NOTE — Assessment & Plan Note (Signed)
Abstinent for last 10 days. No h/o withdrawals or DT. Continue to encourage full cessation given recent transaminitis. Check LFTs today.

## 2014-12-06 NOTE — Patient Instructions (Signed)
Pass by lab for labwork. We will call you with results. Avoid alcohol.

## 2014-12-06 NOTE — Assessment & Plan Note (Signed)
Lab Results  Component Value Date   LDLCALC 53 05/27/2014  continue lipitor 5mg  daily - check CPK.

## 2014-12-06 NOTE — Assessment & Plan Note (Signed)
Chronic, stable. Continue regimen. 

## 2014-12-06 NOTE — Assessment & Plan Note (Signed)
Chronic, stable. Continue metformin 500mg  bid. Check labs today. Foot exam today.

## 2014-12-06 NOTE — Progress Notes (Signed)
BP 136/60 mmHg  Pulse 68  Temp(Src) 98 F (36.7 C) (Oral)  Wt 137 lb 8 oz (62.37 kg)   CC:  6 mo f/u visit Subjective:    Patient ID: Justin Benson, male    DOB: [birth year], 80 y.o.   MRN: [id number]  HPI: Justin Benson is a 80 y.o. male presenting on 12/06/2014 for Follow-up   Intermittent joint aches - R shoulder and R hip.  EtOH - first half of the year did poorly - drinking 6 pack 5/7 days of the week. Over last 3 months has decreased drinking - had three 2-3 wk periods where he didn't drink much. When he drinks, 6 pack. No EtOH over last 1 week.   DM - well controlled on metformin 500mg  bid. Checks intermittently and 137 fasting.  HTN - compliant with amlodipine 10mg  daily and quinapril 20mg  nightly and accuretic 20/25 in am. HLD - compliant with lipitor 5mg  daily.  Mood - sertraline 150mg  daily.   Transaminitis - noted last few labworks. Due for repeat.   Has found several ticks recently.  Exercise classes and walking regularly.  Relevant past medical, surgical, family and social history reviewed and updated as indicated. Interim medical history since our last visit reviewed. Allergies and medications reviewed and updated. Current Outpatient Prescriptions on File Prior to Visit  Medication Sig  . amLODipine (NORVASC) 10 MG tablet Take 1 tablet (10 mg total) by mouth daily before breakfast.  . aspirin 81 MG tablet Take 81 mg by mouth daily.    Marland Kitchen atorvastatin (LIPITOR) 10 MG tablet Take 0.5 tablets (5 mg total) by mouth every evening.  . finasteride (PROSCAR) 5 MG tablet Take 1 tablet (5 mg total) by mouth every evening.  . Multiple Vitamin (MULTIVITAMIN WITH MINERALS) TABS Take 1 tablet by mouth daily.  . quinapril (ACCUPRIL) 20 MG tablet Take 1 tablet (20 mg total) by mouth at bedtime.  . quinapril-hydrochlorothiazide (ACCURETIC) 20-25 MG per tablet Take 1 tablet by mouth daily.  . sertraline (ZOLOFT) 100 MG tablet Take 1.5 tablets (150 mg total) by mouth daily. Takes  1 and 1/2   No current facility-administered medications on file prior to visit.    Review of Systems Per HPI unless specifically indicated above     Objective:    BP 136/60 mmHg  Pulse 68  Temp(Src) 98 F (36.7 C) (Oral)  Wt 137 lb 8 oz (62.37 kg)  Wt Readings from Last 3 Encounters:  12/06/14 137 lb 8 oz (62.37 kg)  06/02/14 136 lb 4 oz (61.803 kg)  02/28/14 139 lb (63.05 kg)    Physical Exam  Constitutional: He appears well-developed and well-nourished. No distress.  HENT:  Head: Normocephalic and atraumatic.  Right Ear: External ear normal.  Left Ear: External ear normal.  Nose: Nose normal.  Mouth/Throat: Oropharynx is clear and moist. No oropharyngeal exudate.  Eyes: Conjunctivae and EOM are normal. Pupils are equal, round, and reactive to light. No scleral icterus.  Neck: Normal range of motion. Neck supple.  Cardiovascular: Normal rate, regular rhythm, normal heart sounds and intact distal pulses.   No murmur heard. Pulmonary/Chest: Effort normal and breath sounds normal. No respiratory distress. He has no wheezes. He has no rales.  Abdominal: Soft. Normal appearance and bowel sounds are normal. He exhibits no distension and no mass. There is no hepatosplenomegaly. There is no tenderness. There is no rebound and no guarding.  Musculoskeletal: He exhibits no edema.  See HPI for foot exam if  done  Lymphadenopathy:    He has no cervical adenopathy.  Skin: Skin is warm and dry. No rash noted.  Psychiatric: He has a normal mood and affect.  Nursing note and vitals reviewed.  Results for orders placed or performed in visit on 09/02/14  Hepatic function panel  Result Value Ref Range   Total Bilirubin 0.6 0.2 - 1.2 mg/dL   Bilirubin, Direct 0.2 0.0 - 0.3 mg/dL   Alkaline Phosphatase 135 (H) 39 - 117 U/L   AST 207 (H) 0 - 37 U/L   ALT 236 (H) 0 - 53 U/L   Total Protein 7.2 6.0 - 8.3 g/dL   Albumin 4.5 3.5 - 5.2 g/dL  Hemoglobin A1c  Result Value Ref Range   Hgb  A1c MFr Bld 6.9 (H) 4.6 - 6.5 %  TSH  Result Value Ref Range   TSH 4.54 (H) 0.35 - 4.50 uIU/mL      Assessment & Plan:   Problem List Items Addressed This Visit    Transaminitis - Primary    Recheck labs today. Recent normal Abd [place].      Relevant Orders   Comprehensive metabolic panel   TSH   T4, free   Lactate Dehydrogenase   HLD (hyperlipidemia)    Lab Results  Component Value Date   LDLCALC 53 05/27/2014  continue lipitor 5mg  daily - check CPK.      Relevant Orders   Comprehensive metabolic panel   CK   Essential hypertension    Chronic, stable. Continue regimen.      Diabetes type 2, controlled    Chronic, stable. Continue metformin 500mg  bid. Check labs today. Foot exam today.      Relevant Medications   metFORMIN (GLUCOPHAGE) 500 MG tablet   Other Relevant Orders   Hemoglobin A1c   Depression    Stable on sertraline - continue.      Alcohol abuse    Abstinent for last 10 days. No h/o withdrawals or DT. Continue to encourage full cessation given recent transaminitis. Check LFTs today.          Follow up plan: Return in about 6 months (around 06/08/2015), or as needed, for medicare wellness.

## 2014-12-06 NOTE — Assessment & Plan Note (Signed)
Recheck labs today. Recent normal Abd [place].

## 2014-12-07 LAB — LACTATE DEHYDROGENASE: LDH: 180 U/L (ref 94–250)

## 2014-12-08 ENCOUNTER — Encounter: Payer: Self-pay | Admitting: Family Medicine

## 2014-12-08 ENCOUNTER — Encounter: Payer: Self-pay | Admitting: *Deleted

## 2015-05-31 ENCOUNTER — Other Ambulatory Visit: Payer: Self-pay | Admitting: Family Medicine

## 2015-05-31 DIAGNOSIS — I10 Essential (primary) hypertension: Secondary | ICD-10-CM

## 2015-05-31 DIAGNOSIS — E119 Type 2 diabetes mellitus without complications: Secondary | ICD-10-CM

## 2015-05-31 DIAGNOSIS — K7010 Alcoholic hepatitis without ascites: Secondary | ICD-10-CM

## 2015-05-31 DIAGNOSIS — N400 Enlarged prostate without lower urinary tract symptoms: Secondary | ICD-10-CM

## 2015-05-31 DIAGNOSIS — E785 Hyperlipidemia, unspecified: Secondary | ICD-10-CM

## 2015-06-02 ENCOUNTER — Other Ambulatory Visit (INDEPENDENT_AMBULATORY_CARE_PROVIDER_SITE_OTHER): Payer: Medicare Other

## 2015-06-02 DIAGNOSIS — K7010 Alcoholic hepatitis without ascites: Secondary | ICD-10-CM

## 2015-06-02 DIAGNOSIS — E119 Type 2 diabetes mellitus without complications: Secondary | ICD-10-CM

## 2015-06-02 DIAGNOSIS — E785 Hyperlipidemia, unspecified: Secondary | ICD-10-CM

## 2015-06-02 DIAGNOSIS — N400 Enlarged prostate without lower urinary tract symptoms: Secondary | ICD-10-CM | POA: Diagnosis not present

## 2015-06-02 LAB — CBC WITH DIFFERENTIAL/PLATELET
BASOS ABS: 0 10*3/uL (ref 0.0–0.1)
Basophils Relative: 0.7 % (ref 0.0–3.0)
EOS PCT: 2.3 % (ref 0.0–5.0)
Eosinophils Absolute: 0.1 10*3/uL (ref 0.0–0.7)
HCT: 40 % (ref 39.0–52.0)
HEMOGLOBIN: 13.5 g/dL (ref 13.0–17.0)
LYMPHS PCT: 22.5 % (ref 12.0–46.0)
Lymphs Abs: 1.2 10*3/uL (ref 0.7–4.0)
MCHC: 33.7 g/dL (ref 30.0–36.0)
MCV: 92.7 fl (ref 78.0–100.0)
MONOS PCT: 12.8 % — AB (ref 3.0–12.0)
Monocytes Absolute: 0.7 10*3/uL (ref 0.1–1.0)
NEUTROS PCT: 61.7 % (ref 43.0–77.0)
Neutro Abs: 3.3 10*3/uL (ref 1.4–7.7)
Platelets: 234 10*3/uL (ref 150.0–400.0)
RBC: 4.32 Mil/uL (ref 4.22–5.81)
RDW: 14 % (ref 11.5–15.5)
WBC: 5.3 10*3/uL (ref 4.0–10.5)

## 2015-06-02 LAB — PSA: PSA: 0.37 ng/mL (ref 0.10–4.00)

## 2015-06-02 LAB — LIPID PANEL
CHOL/HDL RATIO: 2
CHOLESTEROL: 147 mg/dL (ref 0–200)
HDL: 59.1 mg/dL (ref >39.00)
LDL CALC: 71 mg/dL (ref 0–99)
NonHDL: 87.74
TRIGLYCERIDES: 82 mg/dL (ref 0.0–149.0)
VLDL: 16.4 mg/dL (ref 0.0–40.0)

## 2015-06-02 LAB — COMPREHENSIVE METABOLIC PANEL
ALBUMIN: 4.4 g/dL (ref 3.5–5.2)
ALT: 73 U/L — ABNORMAL HIGH (ref 0–53)
AST: 72 U/L — AB (ref 0–37)
Alkaline Phosphatase: 92 U/L (ref 39–117)
BUN: 14 mg/dL (ref 6–23)
CALCIUM: 9.8 mg/dL (ref 8.4–10.5)
CO2: 34 meq/L — AB (ref 19–32)
Chloride: 100 mEq/L (ref 96–112)
Creatinine, Ser: 0.9 mg/dL (ref 0.40–1.50)
GFR: 86.75 mL/min (ref >60.00)
Glucose, Bld: 148 mg/dL — ABNORMAL HIGH (ref 70–99)
POTASSIUM: 4.6 meq/L (ref 3.5–5.1)
Sodium: 139 mEq/L (ref 135–145)
Total Bilirubin: 0.6 mg/dL (ref 0.2–1.2)
Total Protein: 6.6 g/dL (ref 6.0–8.3)

## 2015-06-02 LAB — HEMOGLOBIN A1C: HEMOGLOBIN A1C: 6.3 % (ref 4.6–6.5)

## 2015-06-02 LAB — MICROALBUMIN / CREATININE URINE RATIO
CREATININE, U: 122.9 mg/dL
MICROALB/CREAT RATIO: 0.6 mg/g (ref 0.0–30.0)
Microalb, Ur: <0.7 mg/dL (ref 0.0–1.9)

## 2015-06-02 LAB — GAMMA GT: GGT: 400 U/L — ABNORMAL HIGH (ref 7–51)

## 2015-06-07 ENCOUNTER — Ambulatory Visit (INDEPENDENT_AMBULATORY_CARE_PROVIDER_SITE_OTHER): Payer: Medicare Other | Admitting: Family Medicine

## 2015-06-07 ENCOUNTER — Encounter: Payer: Self-pay | Admitting: Family Medicine

## 2015-06-07 VITALS — BP 126/60 | HR 55 | Temp 97.7°F | Ht 67.25 in | Wt 137.0 lb

## 2015-06-07 DIAGNOSIS — Z0000 Encounter for general adult medical examination without abnormal findings: Secondary | ICD-10-CM

## 2015-06-07 DIAGNOSIS — E785 Hyperlipidemia, unspecified: Secondary | ICD-10-CM

## 2015-06-07 DIAGNOSIS — I700 Atherosclerosis of aorta: Secondary | ICD-10-CM

## 2015-06-07 DIAGNOSIS — F329 Major depressive disorder, single episode, unspecified: Secondary | ICD-10-CM

## 2015-06-07 DIAGNOSIS — Z23 Encounter for immunization: Secondary | ICD-10-CM

## 2015-06-07 DIAGNOSIS — I10 Essential (primary) hypertension: Secondary | ICD-10-CM

## 2015-06-07 DIAGNOSIS — N400 Enlarged prostate without lower urinary tract symptoms: Secondary | ICD-10-CM

## 2015-06-07 DIAGNOSIS — E119 Type 2 diabetes mellitus without complications: Secondary | ICD-10-CM

## 2015-06-07 DIAGNOSIS — F1010 Alcohol abuse, uncomplicated: Secondary | ICD-10-CM

## 2015-06-07 DIAGNOSIS — K7010 Alcoholic hepatitis without ascites: Secondary | ICD-10-CM

## 2015-06-07 DIAGNOSIS — F32A Depression, unspecified: Secondary | ICD-10-CM

## 2015-06-07 DIAGNOSIS — Z7189 Other specified counseling: Secondary | ICD-10-CM

## 2015-06-07 MED ORDER — QUINAPRIL-HYDROCHLOROTHIAZIDE 20-25 MG PO TABS: 1.0000 | ORAL_TABLET | Freq: Every day | ORAL | Status: DC

## 2015-06-07 MED ORDER — FINASTERIDE 5 MG PO TABS: 5.0000 mg | ORAL_TABLET | Freq: Every evening | ORAL | Status: DC

## 2015-06-07 MED ORDER — METFORMIN HCL 500 MG PO TABS: 500.0000 mg | ORAL_TABLET | Freq: Two times a day (BID) | ORAL | Status: DC

## 2015-06-07 MED ORDER — SERTRALINE HCL 100 MG PO TABS: 150.0000 mg | ORAL_TABLET | Freq: Every day | ORAL | Status: DC

## 2015-06-07 MED ORDER — QUINAPRIL HCL 20 MG PO TABS: 20.0000 mg | ORAL_TABLET | Freq: Every day | ORAL | Status: DC

## 2015-06-07 MED ORDER — AMLODIPINE BESYLATE 10 MG PO TABS: 10.0000 mg | ORAL_TABLET | Freq: Every day | ORAL | Status: DC

## 2015-06-07 NOTE — Patient Instructions (Addendum)
Flu shot today I recommend you go to [facility] meetings for support.  Consider glucerna supplement.  Return in 6 months for follow up visit.   Health Maintenance, Male A healthy lifestyle and preventative care can promote health and wellness.  Maintain regular health, dental, and eye exams.  Eat a healthy diet. Foods like vegetables, fruits, whole grains, low-fat dairy products, and lean protein foods contain the nutrients you need and are low in calories. Decrease your intake of foods high in solid fats, added sugars, and salt. Get information about a proper diet from your health care provider, if necessary.  Regular physical exercise is one of the most important things you can do for your health. Most adults should get at least 150 minutes of moderate-intensity exercise (any activity that increases your heart rate and causes you to sweat) each week. In addition, most adults need muscle-strengthening exercises on 2 or more days a week.   Maintain a healthy weight. The body mass index (BMI) is a screening tool to identify possible weight problems. It provides an estimate of body fat based on height and weight. Your health care provider can find your BMI and can help you achieve or maintain a healthy weight. For males 20 years and older:  A BMI below 18.5 is considered underweight.  A BMI of 18.5 to 24.9 is normal.  A BMI of 25 to 29.9 is considered overweight.  A BMI of 30 and above is considered obese.  Maintain normal blood lipids and cholesterol by exercising and minimizing your intake of saturated fat. Eat a balanced diet with plenty of fruits and vegetables. Blood tests for lipids and cholesterol should begin at age 46 and be repeated every 5 years. If your lipid or cholesterol levels are high, you are over age 7, or you are at high risk for heart disease, you may need your cholesterol levels checked more frequently.Ongoing high lipid and cholesterol levels should be treated with medicines  if diet and exercise are not working.  If you smoke, find out from your health care provider how to quit. If you do not use tobacco, do not start.  Lung cancer screening is recommended for adults aged 74-80 years who are at high risk for developing lung cancer because of a history of smoking. A yearly low-dose CT scan of the lungs is recommended for people who have at least a 30-pack-year history of smoking and are current smokers or have quit within the past 15 years. A pack year of smoking is smoking an average of 1 pack of cigarettes a day for 1 year (for example, a 30-pack-year history of smoking could mean smoking 1 pack a day for 30 years or 2 packs a day for 15 years). Yearly screening should continue until the smoker has stopped smoking for at least 15 years. Yearly screening should be stopped for people who develop a health problem that would prevent them from having lung cancer treatment.  If you choose to drink alcohol, do not have more than 2 drinks per day. One drink is considered to be 12 oz (360 mL) of beer, 5 oz (150 mL) of wine, or 1.5 oz (45 mL) of liquor.  Avoid the use of street drugs. Do not share needles with anyone. Ask for help if you need support or instructions about stopping the use of drugs.  High blood pressure causes heart disease and increases the risk of stroke. High blood pressure is more likely to develop in:  People who  have blood pressure in the end of the normal range (100-139/85-89 mm Hg).  People who are overweight or obese.  People who are African American.  If you are 26-62 years of age, have your blood pressure checked every 3-5 years. If you are 58 years of age or older, have your blood pressure checked every year. You should have your blood pressure measured twice--once when you are at a hospital or clinic, and once when you are not at a hospital or clinic. Record the average of the two measurements. To check your blood pressure when you are not at a  hospital or clinic, you can use:  An automated blood pressure machine at a pharmacy.  A home blood pressure monitor.  If you are 66-24 years old, ask your health care provider if you should take aspirin to prevent heart disease.  Diabetes screening involves taking a blood sample to check your fasting blood sugar level. This should be done once every 3 years after age 71 if you are at a normal weight and without risk factors for diabetes. Testing should be considered at a younger age or be carried out more frequently if you are overweight and have at least 1 risk factor for diabetes.  Colorectal cancer can be detected and often prevented. Most routine colorectal cancer screening begins at the age of 53 and continues through age 75. However, your health care provider may recommend screening at an earlier age if you have risk factors for colon cancer. On a yearly basis, your health care provider may provide home test kits to check for hidden blood in the stool. A small camera at the end of a tube may be used to directly examine the colon (sigmoidoscopy or colonoscopy) to detect the earliest forms of colorectal cancer. Talk to your health care provider about this at age 56 when routine screening begins. A direct exam of the colon should be repeated every 5-10 years through age 59, unless early forms of precancerous polyps or small growths are found.  People who are at an increased risk for hepatitis B should be screened for this virus. You are considered at high risk for hepatitis B if:  You were born in a country where hepatitis B occurs often. Talk with your health care provider about which countries are considered high risk.  Your parents were born in a high-risk country and you have not received a shot to protect against hepatitis B (hepatitis B vaccine).  You have HIV or AIDS.  You use needles to inject street drugs.  You live with, or have sex with, someone who has hepatitis B.  You are a  man who has sex with other men (MSM).  You get hemodialysis treatment.  You take certain medicines for conditions like cancer, organ transplantation, and autoimmune conditions.  Hepatitis C blood testing is recommended for all people born from [birth year] through 1963 and any individual with known risk factors for hepatitis C.  Healthy men should no longer receive prostate-specific antigen (PSA) blood tests as part of routine cancer screening. Talk to your health care provider about prostate cancer screening.  Testicular cancer screening is not recommended for adolescents or adult males who have no symptoms. Screening includes self-exam, a health care provider exam, and other screening tests. Consult with your health care provider about any symptoms you have or any concerns you have about testicular cancer.  Practice safe sex. Use condoms and avoid high-risk sexual practices to reduce the spread of sexually transmitted  infections (STIs).  You should be screened for STIs, including gonorrhea and chlamydia if:  You are sexually active and are younger than 24 years.  You are older than 24 years, and your health care provider tells you that you are at risk for this type of infection.  Your sexual activity has changed since you were last screened, and you are at an increased risk for chlamydia or gonorrhea. Ask your health care provider if you are at risk.  If you are at risk of being infected with HIV, it is recommended that you take a prescription medicine daily to prevent HIV infection. This is called pre-exposure prophylaxis (PrEP). You are considered at risk if:  You are a man who has sex with other men (MSM).  You are a heterosexual man who is sexually active with multiple partners.  You take drugs by injection.  You are sexually active with a partner who has HIV.  Talk with your health care provider about whether you are at high risk of being infected with HIV. If you choose to begin PrEP,  you should first be tested for HIV. You should then be tested every 3 months for as long as you are taking PrEP.  Use sunscreen. Apply sunscreen liberally and repeatedly throughout the day. You should seek shade when your shadow is shorter than you. Protect yourself by wearing long sleeves, pants, a wide-brimmed hat, and sunglasses year round whenever you are outdoors.  Tell your health care provider of new moles or changes in moles, especially if there is a change in shape or color. Also, tell your health care provider if a mole is larger than the size of a pencil eraser.  A one-time screening for abdominal aortic aneurysm (AAA) and surgical repair of large AAAs by ultrasound is recommended for men aged 88-75 years who are current or former smokers.  Stay current with your vaccines (immunizations).   This information is not intended to replace advice given to you by your health care provider. Make sure you discuss any questions you have with your health care provider.   Document Released: 01/11/2008 Document Revised: 08/05/2014 Document Reviewed: 12/10/2010 Elsevier Interactive Patient Education [employer].

## 2015-06-07 NOTE — Assessment & Plan Note (Signed)
Advanced directives: has copy of this at home. HCPOA would be daughter, Justin Benson in [place]. Would be ok with temporary measures - feeding tube, breathing tube, but doesn't want prolonged life support Advanced directive in chart (11/2013)

## 2015-06-07 NOTE — Assessment & Plan Note (Signed)
Chronic, stable. Continue sertraline.

## 2015-06-07 NOTE — Assessment & Plan Note (Signed)
Chronic, stable. Continue metformin 500mg  bid. Foot exam today.

## 2015-06-07 NOTE — Assessment & Plan Note (Signed)
Preventative protocols reviewed and updated unless pt declined. Discussed healthy diet and lifestyle.  

## 2015-06-07 NOTE — Assessment & Plan Note (Signed)
Abstinent for last 5 weeks. rec he go to [employer] for further support/accountability.

## 2015-06-07 NOTE — Assessment & Plan Note (Signed)
Chronic, stable. Continue finasteride. DRE/PSA reassuring today.

## 2015-06-07 NOTE — Assessment & Plan Note (Signed)
Chronic, stable. Continue lipitor 5mg  daily.

## 2015-06-07 NOTE — Progress Notes (Signed)
BP 126/60 mmHg  Pulse 55  Temp(Src) 97.7 F (36.5 C) (Oral)  Ht 5' 7.25" (1.708 m)  Wt 137 lb (62.143 kg)  BMI 21.30 kg/m2  SpO2 98%   CC: medicare wellness visit  Subjective:    Patient ID: Justin Benson, male    DOB: 16-Jun-1935, 80 y.o.   MRN: [id number]  HPI: Justin Benson is a 80 y.o. male presenting on 06/07/2015 for Annual Exam and Flu Vaccine   Notices some height loss. He does think he has been measured with shoes on in the past, but today with shoes off. Endorses R lower back pain worse first in am that improves with movement.   Alcohol use with transaminitis and normal abdominal ultrasound 05/2014 - none in last 5 weeks, prior 3-4 drinks/day. No h/o withdrawals or DT. Thought about going to AA but didn't. [facility]. Staying active walking. Denies abd pain, jaundice or icteric, paresthesias. Endorses intermittent troubles with imbalance. Good appetite, 3 meals a day. No weight loss noted.   Passes hearing screen.  Vision screen per eye doctor  No falls in last year  PHQ9 = 2.   Preventative: COLONOSCOPY Date: 02/2014 1 polyp, diverticulosis, no rpt needed [name]) Prostate cancer screening - strong fmhx prostate cancer. Personal h/o BPH on finasteride. Yearly checks.  Flu shot today Td 2011  Pneumovax 2004, prevnar 2015 zostavax 2012  Advanced directives: has copy of this at home. HCPOA would be daughter, Justin Benson in [place]. Would be ok with temporary measures - feeding tube, breathing tube, but doesn't want prolonged life support Advanced directive in chart (11/2013) Seat belt use discussed Sunscreen use discussed. No changing moles on skin. Does crossword puzzles, reads.  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  Relevant past medical, surgical, family and social history reviewed and updated as indicated. Interim medical history since our last visit reviewed. Allergies and medications reviewed and updated. Current Outpatient Prescriptions on File Prior to Visit  Medication Sig  . aspirin 81 MG tablet Take 81 mg by mouth daily.    Marland Kitchen atorvastatin (LIPITOR) 10 MG tablet Take 0.5 tablets (5 mg total) by mouth every evening.  . Multiple Vitamin (MULTIVITAMIN WITH MINERALS) TABS Take 1 tablet by mouth daily.   No current facility-administered medications on file prior to visit.    Review of Systems  Constitutional: Negative for fever, chills, activity change, appetite change, fatigue and unexpected weight change.  HENT: Negative for hearing loss.   Eyes: Negative for visual disturbance.  Respiratory: Negative for cough, chest tightness, shortness of breath and wheezing.   Cardiovascular: Negative for chest pain, palpitations and leg swelling.  Gastrointestinal: Positive for constipation (controlled with prn miralax and citrucel). Negative for nausea, vomiting, abdominal pain, diarrhea, blood in stool and abdominal distention.       Globus sensation - constant clearing of throat  Genitourinary: Negative for hematuria and difficulty urinating.  Musculoskeletal: Negative for myalgias, arthralgias and neck pain.  Skin: Negative for rash.  Neurological: Negative for dizziness, seizures, syncope and headaches.  Hematological: Negative for adenopathy. Does not bruise/bleed easily.  Psychiatric/Behavioral: Positive for dysphoric mood. The patient is not nervous/anxious.    Per HPI unless specifically indicated in ROS section     Objective:    BP 126/60 mmHg  Pulse 55  Temp(Src) 97.7 F (36.5 C) (Oral)  Ht 5' 7.25" (1.708 m)  Wt 137 lb (62.143 kg)  BMI 21.30 kg/m2  SpO2 98%  Wt Readings from Last 3 Encounters:  06/07/15 137 lb (62.143 kg)  12/06/14 137 lb 8 oz (62.37 kg)  06/02/14 136 lb 4  oz (61.803 kg)    Ht Readings from Last 3 Encounters:  06/07/15 5' 7.25" (1.708 m)  06/02/14 5' 9.5" (1.765 m)  02/28/14 5\' 9"  (1.753 m)    Physical Exam  Constitutional: He is oriented to person, place, and time. He appears well-developed and well-nourished. No distress.  thin  HENT:  Head: Normocephalic and atraumatic.  Right Ear: Hearing, tympanic membrane, external ear and ear canal normal.  Left Ear: Hearing, tympanic membrane, external ear and ear canal normal.  Nose: Nose normal.  Mouth/Throat: Uvula is midline, oropharynx is clear and moist and mucous membranes are normal. No oropharyngeal exudate, posterior oropharyngeal edema or posterior oropharyngeal erythema.  Eyes: Conjunctivae and EOM are normal. Pupils are equal, round, and reactive to light. No scleral icterus.  Neck: Normal range of motion. Neck supple. Carotid bruit is not present. No thyromegaly present.  Cardiovascular: Normal rate, regular rhythm, normal heart sounds and intact distal pulses.   No murmur heard. Pulses:      Radial pulses are 2+ on the right side, and 2+ on the left side.  Pulmonary/Chest: Effort normal and breath sounds normal. No respiratory distress. He has no wheezes. He has no rales.  Abdominal: Soft. Bowel sounds are normal. He exhibits no distension and no mass. There is no tenderness. There is no rebound and no guarding.  Genitourinary: Rectum normal. Rectal exam shows no external hemorrhoid, no internal hemorrhoid, no fissure, no mass, no tenderness and anal tone normal. Prostate is enlarged (25gm). Prostate is not tender.  Musculoskeletal: Normal range of motion. He exhibits no edema.  Lymphadenopathy:    He has no cervical adenopathy.  Neurological: He is alert and oriented to person, place, and time.  CN grossly intact, station and gait intact Recall 3/3  calculation 5/5 serial 7s  Skin: Skin is warm and dry. No rash noted.  Psychiatric: He has a normal mood and affect. His behavior  is normal. Judgment and thought content normal.  Nursing note and vitals reviewed.  Results for orders placed or performed in visit on 06/02/15  Lipid panel  Result Value Ref Range   Cholesterol 147 0 - 200 mg/dL   Triglycerides 82.0 0.0 - 149.0 mg/dL   HDL 59.10 >39.00 mg/dL   VLDL 16.4 0.0 - 40.0 mg/dL   LDL Cholesterol 71 0 - 99 mg/dL   Total CHOL/HDL Ratio 2    NonHDL 87.74   Comprehensive metabolic panel  Result Value Ref Range   Sodium 139 135 - 145 mEq/L   Potassium 4.6 3.5 - 5.1 mEq/L   Chloride 100 96 - 112 mEq/L   CO2 34 (H) 19 - 32 mEq/L   Glucose, Bld 148 (H) 70 - 99 mg/dL   BUN 14 6 - 23 mg/dL   Creatinine, Ser 0.90 0.40 - 1.50 mg/dL   Total Bilirubin 0.6 0.2 - 1.2 mg/dL   Alkaline Phosphatase 92 39 - 117 U/L   AST 72 (H) 0 - 37 U/L   ALT 73 (H) 0 - 53 U/L   Total Protein 6.6 6.0 - 8.3 g/dL   Albumin 4.4 3.5 - 5.2 g/dL   Calcium 9.8 8.4 - 10.5 mg/dL   GFR 86.75 >60.00  mL/min  Hemoglobin A1c  Result Value Ref Range   Hgb A1c MFr Bld 6.3 4.6 - 6.5 %  PSA  Result Value Ref Range   PSA 0.37 0.10 - 4.00 ng/mL  CBC with Differential/Platelet  Result Value Ref Range   WBC 5.3 4.0 - 10.5 K/uL   RBC 4.32 4.22 - 5.81 Mil/uL   Hemoglobin 13.5 13.0 - 17.0 g/dL   HCT 40.0 39.0 - 52.0 %   MCV 92.7 78.0 - 100.0 fl   MCHC 33.7 30.0 - 36.0 g/dL   RDW 14.0 11.5 - 15.5 %   Platelets 234.0 150.0 - 400.0 K/uL   Neutrophils Relative % 61.7 43.0 - 77.0 %   Lymphocytes Relative 22.5 12.0 - 46.0 %   Monocytes Relative 12.8 (H) 3.0 - 12.0 %   Eosinophils Relative 2.3 0.0 - 5.0 %   Basophils Relative 0.7 0.0 - 3.0 %   Neutro Abs 3.3 1.4 - 7.7 K/uL   Lymphs Abs 1.2 0.7 - 4.0 K/uL   Monocytes Absolute 0.7 0.1 - 1.0 K/uL   Eosinophils Absolute 0.1 0.0 - 0.7 K/uL   Basophils Absolute 0.0 0.0 - 0.1 K/uL  Gamma GT  Result Value Ref Range   GGT 400 (H) 7 - 51 U/L  Microalbumin / creatinine urine ratio  Result Value Ref Range   Microalb, Ur <0.7 0.0 - 1.9 mg/dL   Creatinine,U  122.9 mg/dL   Microalb Creat Ratio 0.6 0.0 - 30.0 mg/g      Assessment & Plan:   Problem List Items Addressed This Visit    Medicare annual wellness visit, subsequent - Primary    I have personally reviewed the Medicare Annual Wellness questionnaire and have noted 1. The patient's medical and social history 2. Their use of alcohol, tobacco or illicit drugs 3. Their current medications and supplements 4. The patient's functional ability including ADL's, fall risks, home safety risks and hearing or visual impairment. Cognitive function has been assessed and addressed as indicated.  5. Diet and physical activity 6. Evidence for depression or mood disorders The patients weight, height, BMI have been recorded in the chart. I have made referrals, counseling and provided education to the patient based on review of the above and I have provided the pt with a written personalized care plan for preventive services. Provider list updated.. See scanned questionairre as needed for further documentation. Reviewed preventative protocols and updated unless pt declined.       HLD (hyperlipidemia)    Chronic, stable. Continue lipitor 5mg  daily.      Relevant Medications   quinapril (ACCUPRIL) 20 MG tablet   quinapril-hydrochlorothiazide (ACCURETIC) 20-25 MG tablet   amLODipine (NORVASC) 10 MG tablet   Health maintenance examination    Preventative protocols reviewed and updated unless pt declined. Discussed healthy diet and lifestyle.       Essential hypertension    Chronic, stable. Continue current regimen.      Relevant Medications   quinapril (ACCUPRIL) 20 MG tablet   quinapril-hydrochlorothiazide (ACCURETIC) 20-25 MG tablet   amLODipine (NORVASC) 10 MG tablet   Diabetes type 2, controlled (HCC)    Chronic, stable. Continue metformin 500mg  bid. Foot exam today.      Relevant Medications   quinapril (ACCUPRIL) 20 MG tablet   quinapril-hydrochlorothiazide (ACCURETIC) 20-25 MG tablet     metFORMIN (GLUCOPHAGE) 500 MG tablet   Depression    Chronic, stable. Continue sertraline.      Relevant Medications   sertraline (ZOLOFT) 100 MG tablet  BPH (benign prostatic hypertrophy)    Chronic, stable. Continue finasteride. DRE/PSA reassuring today.      Relevant Medications   finasteride (PROSCAR) 5 MG tablet   Alcoholic hepatitis without ascites    Normal abd [place] 05/2014 - consider recheck next year. Encouraged full cessation from alcohol. Consider vitamin levels next labwork.      Alcohol abuse    Abstinent for last 5 weeks. rec he go to [employer] for further support/accountability.      Advanced care planning/counseling discussion    Advanced directives: has copy of this at home. HCPOA would be daughter, Justin Benson in [place]. Would be ok with temporary measures - feeding tube, breathing tube, but doesn't want prolonged life support Advanced directive in chart (11/2013)      Abdominal aortic atherosclerosis (HCC)    Continue aspirin, low dose lipitor      Relevant Medications   quinapril (ACCUPRIL) 20 MG tablet   quinapril-hydrochlorothiazide (ACCURETIC) 20-25 MG tablet   amLODipine (NORVASC) 10 MG tablet       Follow up plan: Return in about 6 months (around 12/05/2015), or as needed, for follow up visit.

## 2015-06-07 NOTE — Assessment & Plan Note (Signed)
Continue aspirin, low dose lipitor

## 2015-06-07 NOTE — Assessment & Plan Note (Signed)

## 2015-06-07 NOTE — Assessment & Plan Note (Signed)
Normal abd [place] 05/2014 - consider recheck next year. Encouraged full cessation from alcohol. Consider vitamin levels next labwork.

## 2015-06-07 NOTE — Assessment & Plan Note (Signed)
Chronic, stable. Continue current regimen. 

## 2015-06-07 NOTE — Progress Notes (Signed)
Pre visit review using our clinic review tool, if applicable. No additional management support is needed unless otherwise documented below in the visit note. 

## 2015-06-07 NOTE — Addendum Note (Signed)
Addended by: Lurlean Nanny on: 06/07/2015 02:59 PM   Modules accepted: Orders

## 2015-08-18 ENCOUNTER — Other Ambulatory Visit: Payer: Self-pay | Admitting: *Deleted

## 2015-08-18 MED ORDER — ATORVASTATIN CALCIUM 10 MG PO TABS: 5.0000 mg | ORAL_TABLET | Freq: Every evening | ORAL | Status: DC

## 2015-09-27 DIAGNOSIS — C4492 Squamous cell carcinoma of skin, unspecified: Secondary | ICD-10-CM

## 2015-09-27 HISTORY — DX: Squamous cell carcinoma of skin, unspecified: C44.92

## 2015-12-05 ENCOUNTER — Encounter: Payer: Self-pay | Admitting: Family Medicine

## 2015-12-05 ENCOUNTER — Ambulatory Visit (INDEPENDENT_AMBULATORY_CARE_PROVIDER_SITE_OTHER): Payer: Medicare Other | Admitting: Family Medicine

## 2015-12-05 VITALS — BP 130/60 | HR 60 | Temp 98.1°F | Wt 141.5 lb

## 2015-12-05 DIAGNOSIS — K7010 Alcoholic hepatitis without ascites: Secondary | ICD-10-CM | POA: Diagnosis not present

## 2015-12-05 DIAGNOSIS — F1010 Alcohol abuse, uncomplicated: Secondary | ICD-10-CM | POA: Diagnosis not present

## 2015-12-05 DIAGNOSIS — I10 Essential (primary) hypertension: Secondary | ICD-10-CM | POA: Diagnosis not present

## 2015-12-05 DIAGNOSIS — I700 Atherosclerosis of aorta: Secondary | ICD-10-CM

## 2015-12-05 DIAGNOSIS — E119 Type 2 diabetes mellitus without complications: Secondary | ICD-10-CM | POA: Diagnosis not present

## 2015-12-05 LAB — COMPREHENSIVE METABOLIC PANEL
ALBUMIN: 4.4 g/dL (ref 3.5–5.2)
ALK PHOS: 119 U/L — AB (ref 39–117)
ALT: 195 U/L — ABNORMAL HIGH (ref 0–53)
AST: 114 U/L — AB (ref 0–37)
BILIRUBIN TOTAL: 0.6 mg/dL (ref 0.2–1.2)
BUN: 18 mg/dL (ref 6–23)
CHLORIDE: 97 meq/L (ref 96–112)
CO2: 32 meq/L (ref 19–32)
CREATININE: 0.85 mg/dL (ref 0.40–1.50)
Calcium: 9.6 mg/dL (ref 8.4–10.5)
GFR: 92.54 mL/min (ref >60.00)
GLUCOSE: 132 mg/dL — AB (ref 70–99)
Potassium: 4.1 mEq/L (ref 3.5–5.1)
SODIUM: 136 meq/L (ref 135–145)
Total Protein: 7 g/dL (ref 6.0–8.3)

## 2015-12-05 LAB — HEMOGLOBIN A1C: Hgb A1c MFr Bld: 7.2 % — ABNORMAL HIGH (ref 4.6–6.5)

## 2015-12-05 NOTE — Assessment & Plan Note (Signed)
Anticipate good control. Continue metformin 500mg  bid. rec schedule eye exam as due.

## 2015-12-05 NOTE — Progress Notes (Signed)
Pre visit review using our clinic review tool, if applicable. No additional management support is needed unless otherwise documented below in the visit note. 

## 2015-12-05 NOTE — Assessment & Plan Note (Signed)
Encouraged continued attendance to [facility].

## 2015-12-05 NOTE — Assessment & Plan Note (Signed)
Chronic, stable. Continue current regimen. 

## 2015-12-05 NOTE — Progress Notes (Signed)
BP 130/60 mmHg  Pulse 60  Temp(Src) 98.1 F (36.7 C) (Oral)  Wt 141 lb 8 oz (64.184 kg)   CC: 6 mo f/u visit  Subjective:    Patient ID: Justin Benson, male    DOB: September 15, 1934, 81 y.o.   MRN: [id number]  HPI: ROLLA HAGIE is a 81 y.o. male presenting on 12/05/2015 for Follow-up   Weight gain noted today.  Squamous cell cancer removed L ring finger  Saw [place] ortho this year for R hip pain, s/p xray/MRI found spine lesion so pt returned to Dr Trenton Gammon who thought things were stable.   DM - regularly does check sugars 130-140s fasting. Compliant with antihyperglycemic regimen which includes: metformin 500mg  bid.  Denies low sugars or hypoglycemic symptoms.  Denies paresthesias. Last diabetic eye exam 10/2013, DUE.  Pneumovax: 2004.  Prevnar: 2015. Lab Results  Component Value Date   HGBA1C 6.3 06/02/2015   Diabetic Foot Exam - Simple   No data filed      Alcohol use with transaminitis and normal abdominal ultrasound 05/2014 - quit drinking for 2-3 months. Restarted 08/2015, 5-6 beers/day. Started AA since early 09/2015. No h/o withdrawals or DT. [facility], staying active walking. 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  Denies abd pain, jaundice or icteric, paresthesias. Endorses intermittent troubles with imbalance. Good appetite, 3 meals a day. No weight loss noted.   Relevant past medical, surgical, family and social history reviewed and updated as indicated. Interim medical history since our last visit reviewed. Allergies and medications reviewed and updated. Current Outpatient Prescriptions on File Prior to Visit  Medication Sig  . amLODipine (NORVASC) 10 MG tablet Take 1 tablet (10 mg total) by mouth daily before breakfast.  . aspirin 81 MG tablet Take 81 mg by mouth daily.    Marland Kitchen atorvastatin (LIPITOR) 10 MG tablet Take 0.5 tablets (5 mg total) by mouth every evening.  . finasteride (PROSCAR) 5 MG tablet Take 1 tablet (5 mg total) by  mouth every evening.  . metFORMIN (GLUCOPHAGE) 500 MG tablet Take 1 tablet (500 mg total) by mouth 2 (two) times daily with a meal.  . Multiple Vitamin (MULTIVITAMIN WITH MINERALS) TABS Take 1 tablet by mouth daily.  . quinapril (ACCUPRIL) 20 MG tablet Take 1 tablet (20 mg total) by mouth at bedtime.  . quinapril-hydrochlorothiazide (ACCURETIC) 20-25 MG tablet Take 1 tablet by mouth daily.  . sertraline (ZOLOFT) 100 MG tablet Take 1.5 tablets (150 mg total) by mouth daily. Takes 1 and 1/2   No current facility-administered medications on file prior to visit.    Review of Systems Per HPI unless specifically indicated in ROS section     Objective:    BP 130/60 mmHg  Pulse 60  Temp(Src) 98.1 F (36.7 C) (Oral)  Wt 141 lb 8 oz (64.184 kg)  Wt Readings from Last 3 Encounters:  12/05/15 141 lb 8 oz (64.184 kg)  06/07/15 137 lb (62.143 kg)  12/06/14 137 lb 8 oz (62.37 kg)   Body mass index is 22 kg/(m^2).  Physical Exam  Constitutional: He appears well-developed and well-nourished. No distress.  HENT:  Head: Normocephalic and atraumatic.  Right Ear: External ear normal.  Left Ear: External ear normal.  Nose: Nose normal.  Mouth/Throat: Oropharynx is clear and moist. No oropharyngeal exudate.  Eyes: Conjunctivae and EOM are normal. Pupils are equal, round, and reactive to light. No scleral icterus.  Neck: Normal range of motion. Neck  supple.  Cardiovascular: Normal rate, regular rhythm, normal heart sounds and intact distal pulses.   No murmur heard. Pulmonary/Chest: Effort normal and breath sounds normal. No respiratory distress. He has no wheezes. He has no rales.  Abdominal: Soft. Normal appearance and bowel sounds are normal. He exhibits no distension and no mass. There is no hepatosplenomegaly. There is no tenderness. There is no rebound and no guarding.  Musculoskeletal: He exhibits no edema.  See HPI for foot exam if done  Lymphadenopathy:    He has no cervical adenopathy.    Skin: Skin is warm and dry. No rash noted.  Psychiatric: He has a normal mood and affect.  Nursing note and vitals reviewed.  Results for orders placed or performed in visit on 06/02/15  Lipid panel  Result Value Ref Range   Cholesterol 147 0 - 200 mg/dL   Triglycerides 82.0 0.0 - 149.0 mg/dL   HDL 59.10 >39.00 mg/dL   VLDL 16.4 0.0 - 40.0 mg/dL   LDL Cholesterol 71 0 - 99 mg/dL   Total CHOL/HDL Ratio 2    NonHDL 87.74   Comprehensive metabolic panel  Result Value Ref Range   Sodium 139 135 - 145 mEq/L   Potassium 4.6 3.5 - 5.1 mEq/L   Chloride 100 96 - 112 mEq/L   CO2 34 (H) 19 - 32 mEq/L   Glucose, Bld 148 (H) 70 - 99 mg/dL   BUN 14 6 - 23 mg/dL   Creatinine, Ser 0.90 0.40 - 1.50 mg/dL   Total Bilirubin 0.6 0.2 - 1.2 mg/dL   Alkaline Phosphatase 92 39 - 117 U/L   AST 72 (H) 0 - 37 U/L   ALT 73 (H) 0 - 53 U/L   Total Protein 6.6 6.0 - 8.3 g/dL   Albumin 4.4 3.5 - 5.2 g/dL   Calcium 9.8 8.4 - 10.5 mg/dL   GFR 86.75 >60.00 mL/min  Hemoglobin A1c  Result Value Ref Range   Hgb A1c MFr Bld 6.3 4.6 - 6.5 %  PSA  Result Value Ref Range   PSA 0.37 0.10 - 4.00 ng/mL  CBC with Differential/Platelet  Result Value Ref Range   WBC 5.3 4.0 - 10.5 K/uL   RBC 4.32 4.22 - 5.81 Mil/uL   Hemoglobin 13.5 13.0 - 17.0 g/dL   HCT 40.0 39.0 - 52.0 %   MCV 92.7 78.0 - 100.0 fl   MCHC 33.7 30.0 - 36.0 g/dL   RDW 14.0 11.5 - 15.5 %   Platelets 234.0 150.0 - 400.0 K/uL   Neutrophils Relative % 61.7 43.0 - 77.0 %   Lymphocytes Relative 22.5 12.0 - 46.0 %   Monocytes Relative 12.8 (H) 3.0 - 12.0 %   Eosinophils Relative 2.3 0.0 - 5.0 %   Basophils Relative 0.7 0.0 - 3.0 %   Neutro Abs 3.3 1.4 - 7.7 K/uL   Lymphs Abs 1.2 0.7 - 4.0 K/uL   Monocytes Absolute 0.7 0.1 - 1.0 K/uL   Eosinophils Absolute 0.1 0.0 - 0.7 K/uL   Basophils Absolute 0.0 0.0 - 0.1 K/uL  Gamma GT  Result Value Ref Range   GGT 400 (H) 7 - 51 U/L  Microalbumin / creatinine urine ratio  Result Value Ref Range    Microalb, Ur <0.7 0.0 - 1.9 mg/dL   Creatinine,U 122.9 mg/dL   Microalb Creat Ratio 0.6 0.0 - 30.0 mg/g      Assessment & Plan:   Problem List Items Addressed This Visit    Diabetes type 2,  controlled ([place]) - Primary    Anticipate good control. Continue metformin 500mg  bid. rec schedule eye exam as due.      Relevant Orders   Hemoglobin A1c   Alcohol abuse    Encouraged continued attendance to AA.       Essential hypertension    Chronic, stable. Continue current regimen.      Alcoholic hepatitis without ascites    Recheck LFTs. H/o transaminitis.      Relevant Orders   Comprehensive metabolic panel   Abdominal aortic atherosclerosis (HCC)    Continue aspirin, lipitor daily.          Follow up plan: Return in about 6 months (around 06/06/2016), or as needed, for medicare wellness visit.  [name], MD

## 2015-12-05 NOTE — Patient Instructions (Addendum)
Sign release for records from [facility] imaging and Dr Irven Baltimore recent evaluation.  Labs today.  Schedule eye exam as you're due.  Good to see you today, call us with questions.  Return as needed or in 6 months for medicare wellness visit.

## 2015-12-05 NOTE — Assessment & Plan Note (Addendum)
Continue aspirin, lipitor daily.

## 2015-12-05 NOTE — Assessment & Plan Note (Signed)
Recheck LFTs. H/o transaminitis.

## 2015-12-11 ENCOUNTER — Encounter: Payer: Self-pay | Admitting: *Deleted

## 2015-12-23 ENCOUNTER — Encounter: Payer: Self-pay | Admitting: Family Medicine

## 2015-12-27 LAB — HM DIABETES EYE EXAM

## 2016-01-05 ENCOUNTER — Encounter: Payer: Self-pay | Admitting: Family Medicine

## 2016-01-21 ENCOUNTER — Encounter (HOSPITAL_COMMUNITY): Payer: Self-pay | Admitting: Emergency Medicine

## 2016-01-21 ENCOUNTER — Ambulatory Visit (HOSPITAL_COMMUNITY)
Admission: EM | Admit: 2016-01-21 | Discharge: 2016-01-21 | Disposition: A | Discharge status: Home / Self Care | Payer: Medicare Other | Attending: Family Medicine | Admitting: Family Medicine

## 2016-01-21 DIAGNOSIS — H6122 Impacted cerumen, left ear: Secondary | ICD-10-CM

## 2016-01-21 NOTE — ED Notes (Signed)
Left ear is sore and hearing is decreased.  Patient has been using q-tip in ear

## 2016-01-21 NOTE — ED Provider Notes (Signed)
CSN: [id number]     Arrival date & time 01/21/16  1443 History   None    Chief Complaint  Patient presents with  . Otalgia    Patient is a 81 y.o. male presenting with ear pain. The history is provided by the patient.  Otalgia Location:  Left Behind ear:  No abnormality Quality:  Pressure Severity:  Mild Onset quality:  Gradual Duration:  1 week Timing:  Constant Chronicity:  Recurrent Context: foreign body   Context: not direct blow, not elevation change, not loud noise and no water in ear   Relieved by:  Nothing Associated symptoms: hearing loss   Associated symptoms: no congestion, no cough, no ear discharge, no fever, no neck pain, no rhinorrhea, no sore throat, no tinnitus and no vomiting   Risk factors: no chronic ear infection   Pt has noted increasing pressure and decreased hearing to is (L) ear. He has had cerumen impactions in the past and this is similar. States he has attempted irrigations at home w/o relief.   Past Medical History  Diagnosis Date  . Diabetes mellitus 4/02  . Hypertension 1996  . COPD (chronic obstructive pulmonary disease) ([place])   . Depression   . Hyperlipidemia   . BPH (benign prostatic hypertrophy)   . Pneumonia as a baby  . AAA (abdominal aortic aneurysm) ([place]) 05/2012    mild 2.3 cm , rpt 1 year  . GERD (gastroesophageal reflux disease)   . Arthritis     fingers and hip  . Schwannoma of spinal cord ([place]) 11/2012, 09/2015    medial to R psoas muscle, 4cm size  . Choledocholithiasis 11/2012    s/p ERCP, s/p lap chole  . Alcoholic hepatitis without ascites 06/02/2014    Normal viral hep panel, normal abd [place] (2015)  . 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 Circumcision  10/30/09    Dr Gaynelle Arabian  . Colonoscopy  11/2008    2 small polyps (1 tubular), diverticulosis, lipoma, int/ext hemorrhoids [name])  . Endoscopic retrograde  cholangiopancreatography (ercp) with propofol N/A 12/24/2012    Procedure: ENDOSCOPIC RETROGRADE CHOLANGIOPANCREATOGRAPHY (ERCP) WITH PROPOFOL;  Surgeon: Milus Banister, MD;  Location: [facility];  Service: Endoscopy;  Laterality: N/A;  . Laparoscopic cholecystectomy  02/2013    Dr. Ninfa Linden  . Cholecystectomy N/A 03/08/2013    Procedure: LAPAROSCOPIC CHOLECYSTECTOMY WITH INTRAOPERATIVE CHOLANGIOGRAM;  Surgeon: Harl Bowie, MD;  Location: [facility];  Service: General;  Laterality: N/A;  . Colonoscopy  02/2014    1 polyp, diverticulosis, no rpt needed [name])   Family History  Problem Relation Age of Onset  . Prostate cancer Father   . Aneurysm Father     aortic  . Stroke Father   . Hodgkin's lymphoma Brother 9  . Prostate cancer Brother 69    1/2  . Prostate cancer Brother 9    1/2  . Other Sister     cerebral hemm  . Lupus Sister   . Colon cancer Neg Hx   . Diabetes Brother    Social History  Substance Use Topics  . Smoking status: Former Smoker -- 2.00 packs/day for 30 years    Types: Cigarettes    Start date: 07/29/1962    Quit date: 07/29/1992  . Smokeless tobacco: Never Used  . Alcohol Use: 7.2 oz/week    12 Cans of beer per  week     Comment: rare---recently stopped drinking as much    Review of Systems  Constitutional: Negative for fever.  HENT: Positive for ear pain and hearing loss. Negative for congestion, ear discharge, rhinorrhea, sore throat and tinnitus.   Respiratory: Negative for cough.   Gastrointestinal: Negative for vomiting.  Musculoskeletal: Negative for neck pain.  All other systems reviewed and are negative.   Allergies  Review of patient's allergies indicates no known allergies.  Home Medications   Prior to Admission medications   Medication Sig Start Date End Date Taking? Authorizing Provider  amLODipine (NORVASC) 10 MG tablet Take 1 tablet (10 mg total) by mouth daily before breakfast. 06/07/15   [name], MD  aspirin 81 MG  tablet Take 81 mg by mouth daily.      Historical Provider, MD  atorvastatin (LIPITOR) 10 MG tablet Take 0.5 tablets (5 mg total) by mouth every evening. 08/18/15   [name], MD  finasteride (PROSCAR) 5 MG tablet Take 1 tablet (5 mg total) by mouth every evening. 06/07/15   [name], MD  metFORMIN (GLUCOPHAGE) 500 MG tablet Take 1 tablet (500 mg total) by mouth 2 (two) times daily with a meal. 06/07/15   [name], MD  Multiple Vitamin (MULTIVITAMIN WITH MINERALS) TABS Take 1 tablet by mouth daily.    Historical Provider, MD  quinapril (ACCUPRIL) 20 MG tablet Take 1 tablet (20 mg total) by mouth at bedtime. 06/07/15   [name], MD  quinapril-hydrochlorothiazide (ACCURETIC) 20-25 MG tablet Take 1 tablet by mouth daily. 06/07/15   [name], MD  sertraline (ZOLOFT) 100 MG tablet Take 1.5 tablets (150 mg total) by mouth daily. Takes 1 and 1/2 06/07/15   [name], MD   Meds Ordered and Administered this Visit  Medications - No data to display  BP 152/66 mmHg  Pulse 67  Temp(Src) 97.5 F (36.4 C) (Oral)  Resp 16  SpO2 97% No data found.   Physical Exam  Constitutional: He is oriented to person, place, and time. He appears well-developed and well-nourished.  HENT:  Head: Normocephalic and atraumatic.  Right Ear: Tympanic membrane normal.  Cerumen impaction (L) ear  Eyes: Conjunctivae are normal.  Cardiovascular: Normal rate.   Pulmonary/Chest: Effort normal.  Neurological: He is alert and oriented to person, place, and time.  Skin: Skin is warm and dry.  Psychiatric: He has a normal mood and affect.    ED Course  Procedures (including critical care time)  Labs Review Labs Reviewed - No data to display  Imaging Review No results found.   Visual Acuity Review  Right Eye Distance:   Left Eye Distance:   Bilateral Distance:    Right Eye Near:   Left Eye Near:    Bilateral Near:         MDM   1. Cerumen impaction, left    Though entire impaction not removed w/ irrigation a significant portion removed and pt reports relief of symptoms. Discussed home care and maintenance to try and prevent impactions (H202 in ears prior to showering), as well as OTC remedies. Pt verbalized understanding.     Jeryl Columbia, NP 01/21/16 1936

## 2016-01-21 NOTE — Discharge Instructions (Signed)
Use several drops of hydrogen peroxide in your ear prior to showering over the next few days to remove remaining wax then as needed in the future for symptoms of wax buildup.   Cerumen Impaction The structures of the external ear canal secrete a waxy substance known as cerumen. Excess cerumen can build up in the ear canal, causing a condition known as cerumen impaction. Cerumen impaction can cause ear pain and disrupt the function of the ear. The rate of cerumen production differs for each individual. In certain individuals, the configuration of the ear canal may decrease his or her ability to naturally remove cerumen. CAUSES Cerumen impaction is caused by excessive cerumen production or buildup. RISK FACTORS  Frequent use of swabs to clean ears.  Having narrow ear canals.  Having eczema.  Being dehydrated. SIGNS AND SYMPTOMS  Diminished hearing.  Ear drainage.  Ear pain.  Ear itch. TREATMENT Treatment may involve:  Over-the-counter or prescription ear drops to soften the cerumen.  Removal of cerumen by a health care provider. This may be done with:  Irrigation with warm water. This is the most common method of removal.  Ear curettes and other instruments.  Surgery. This may be done in severe cases. HOME CARE INSTRUCTIONS  Take medicines only as directed by your health care provider.  Do not insert objects into the ear with the intent of cleaning the ear. PREVENTION  Do not insert objects into the ear, even with the intent of cleaning the ear. Removing cerumen as a part of normal hygiene is not necessary, and the use of swabs in the ear canal is not recommended.  Drink enough water to keep your urine clear or pale yellow.  Control your eczema if you have it. SEEK MEDICAL CARE IF:  You develop ear pain.  You develop bleeding from the ear.  The cerumen does not clear after you use ear drops as directed.   This information is not intended to replace advice given  to you by your health care provider. Make sure you discuss any questions you have with your health care provider.   Document Released: 08/22/2004 Document Revised: 08/05/2014 Document Reviewed: 03/01/2015 Elsevier Interactive Patient Education [employer].

## 2016-02-03 ENCOUNTER — Other Ambulatory Visit: Payer: Self-pay | Admitting: Family Medicine

## 2016-03-28 ENCOUNTER — Ambulatory Visit (INDEPENDENT_AMBULATORY_CARE_PROVIDER_SITE_OTHER): Payer: Medicare Other | Admitting: Family Medicine

## 2016-03-28 ENCOUNTER — Encounter: Payer: Self-pay | Admitting: Family Medicine

## 2016-03-28 VITALS — BP 124/68 | HR 81 | Temp 98.1°F | Wt 138.5 lb

## 2016-03-28 DIAGNOSIS — F1010 Alcohol abuse, uncomplicated: Secondary | ICD-10-CM | POA: Diagnosis not present

## 2016-03-28 DIAGNOSIS — H6591 Unspecified nonsuppurative otitis media, right ear: Secondary | ICD-10-CM

## 2016-03-28 DIAGNOSIS — H7290 Unspecified perforation of tympanic membrane, unspecified ear: Secondary | ICD-10-CM

## 2016-03-28 DIAGNOSIS — H6590 Unspecified nonsuppurative otitis media, unspecified ear: Secondary | ICD-10-CM | POA: Insufficient documentation

## 2016-03-28 DIAGNOSIS — H7291 Unspecified perforation of tympanic membrane, right ear: Secondary | ICD-10-CM

## 2016-03-28 NOTE — Progress Notes (Signed)
Pre visit review using our clinic review tool, if applicable. No additional management support is needed unless otherwise documented below in the visit note. 

## 2016-03-28 NOTE — Progress Notes (Signed)
BP 124/68 (BP Location: Right Arm, Patient Position: Sitting, Cuff Size: Normal)   Pulse 81   Temp 98.1 F (36.7 C) (Oral)   Wt 138 lb 8 oz (62.8 kg)   SpO2 96%   BMI 21.53 kg/m    CC: discuss ENT referral. Subjective:    Patient ID: Justin Benson, male    DOB: 08/16/1934, 81 y.o.   MRN: [id number]  HPI: SHAQUEL CHAVOUS is a 81 y.o. male presenting on 03/28/2016 for Discuss Referral to ENT and Ear Fullness   2+ month h/o L ear wax buildup s/p irrigation at [facility]. Reviewed note. At that time, R TM described as WNL and documented successful L ear irrigation. Now over last 2 wks R ear started feeling full and noticing yellow discharge from R ear. Affecting hearing. L ear remains congested with decreased hearing. Denies fevers/chills, no significant pain, congestion, recent swimming.   Has treated with hydrogen peroxide without improvement. He also tried OTC ear wax cleaning kit with bulb suction without improvement. He has been using q tips to remove debris from ears.  Has previously seen Dr Redmond Baseman.   Continues drinking - up to 5-6 drinks/day. Stopped going to AA.   Relevant past medical, surgical, family and social history reviewed and updated as indicated. Interim medical history since our last visit reviewed. Allergies and medications reviewed and updated. Current Outpatient Prescriptions on File Prior to Visit  Medication Sig  . amLODipine (NORVASC) 10 MG tablet Take 1 tablet (10 mg total) by mouth daily before breakfast.  . aspirin 81 MG tablet Take 81 mg by mouth daily.    Marland Kitchen atorvastatin (LIPITOR) 10 MG tablet Take 0.5 tablets (5 mg total) by mouth every evening.  . finasteride (PROSCAR) 5 MG tablet Take 1 tablet (5 mg total) by mouth every evening.  . metFORMIN (GLUCOPHAGE) 500 MG tablet Take 1 tablet (500 mg total) by mouth 2 (two) times daily with a meal.  . Multiple Vitamin (MULTIVITAMIN WITH MINERALS) TABS Take 1 tablet by mouth daily.  . quinapril (ACCUPRIL) 20 MG  tablet Take 1 tablet (20 mg total) by mouth at bedtime.  . quinapril-hydrochlorothiazide (ACCURETIC) 20-25 MG tablet Take 1 tablet by mouth daily.  . sertraline (ZOLOFT) 100 MG tablet Take 1 and 1/2 tablets by  mouth daily   No current facility-administered medications on file prior to visit.    Past Medical History:  Diagnosis Date  . AAA (abdominal aortic aneurysm) ([place]) 05/2012   mild 2.3 cm , rpt 1 year  . Alcoholic hepatitis without ascites 06/02/2014   Normal viral hep panel, normal abd [place] (2015)  . Arthritis    fingers and hip  . BPH (benign prostatic hypertrophy)   . Choledocholithiasis 11/2012   s/p ERCP, s/p lap chole  . COPD (chronic obstructive pulmonary disease) ([place])   . Depression   . Diabetes mellitus 4/02  . GERD (gastroesophageal reflux disease)   . Hyperlipidemia   . Hypertension 1996  . Pneumonia as a baby  . 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 Schwannoma of spinal cord ([place]) 11/2012, 09/2015   medial to R psoas muscle, 4cm size  . 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 CHOLECYSTECTOMY N/A 03/08/2013   Procedure: LAPAROSCOPIC CHOLECYSTECTOMY WITH INTRAOPERATIVE CHOLANGIOGRAM;  Surgeon: Harl Bowie, MD;  Location: [facility];  Service: General;  Laterality: N/A;  . CIRCUMCISION  10/30/09  Dr Gaynelle Arabian  . COLONOSCOPY  11/2008   2 small polyps (1 tubular), diverticulosis, lipoma, int/ext hemorrhoids [name])  . COLONOSCOPY  02/2014   1 polyp, diverticulosis, no rpt needed [name])  . ENDOSCOPIC RETROGRADE CHOLANGIOPANCREATOGRAPHY (ERCP) WITH PROPOFOL N/A 12/24/2012   Procedure: ENDOSCOPIC RETROGRADE CHOLANGIOPANCREATOGRAPHY (ERCP) WITH PROPOFOL;  Surgeon: Milus Banister, MD;  [facility];  Service: Endoscopy;  Laterality: N/A;  . LAPAROSCOPIC CHOLECYSTECTOMY  02/2013   Dr. Ninfa Linden    Family History  Problem Relation Age of Onset  . Prostate cancer Father   . Aneurysm Father       aortic  . Stroke Father   . Hodgkin's lymphoma Brother 69  . Prostate cancer Brother 66    1/2  . Prostate cancer Brother 19    1/2  . Other Sister     cerebral hemm  . Lupus Sister   . Diabetes Brother   . Colon cancer Neg Hx     Social History  Substance Use Topics  . Smoking status: Former Smoker    Packs/day: 2.00    Years: 30.00    Types: Cigarettes    Start date: 07/29/1962    Quit date: 07/29/1992  . Smokeless tobacco: Never Used  . Alcohol use 7.2 oz/week    12 Cans of beer per week     Comment: rare---recently stopped drinking as much    Review of Systems Per HPI unless specifically indicated in ROS section     Objective:    BP 124/68 (BP Location: Right Arm, Patient Position: Sitting, Cuff Size: Normal)   Pulse 81   Temp 98.1 F (36.7 C) (Oral)   Wt 138 lb 8 oz (62.8 kg)   SpO2 96%   BMI 21.53 kg/m   Wt Readings from Last 3 Encounters:  03/28/16 138 lb 8 oz (62.8 kg)  12/05/15 141 lb 8 oz (64.2 kg)  06/07/15 137 lb (62.1 kg)    Physical Exam  Constitutional: He appears well-nourished. No distress.  Slightly disheveled  HENT:  Head: Normocephalic and atraumatic.  Right Ear: External ear and ear canal normal. Decreased hearing is noted.  Left Ear: External ear and ear canal normal. Decreased hearing is noted.  Nose: Nose normal.  Mouth/Throat: Oropharynx is clear and moist. No oropharyngeal exudate.  L TM with thick fluid behind TM R TM ruptured with thick yellow fluid behind TM No pain  Eyes: Conjunctivae and EOM are normal. Pupils are equal, round, and reactive to light.  Nursing note and vitals reviewed.     Assessment & Plan:   Problem List Items Addressed This Visit    Alcohol abuse    More disheveled today. Encouraged restart AA.       Serous otitis media with rupture of tympanic membrane - Primary    Bilateral serous otitis along with ruptured R TM.  Will refer to ENT for further management - pt agrees.  Advised stop Q tips and  peroxide.  Did not start abx at this time.      Relevant Orders   Ambulatory referral to ENT    Other Visit Diagnoses   None.      Follow up plan: No Follow-up on file.  [name], MD

## 2016-03-28 NOTE — Patient Instructions (Addendum)
Stop peroxide. Stop q tips. L ear has lots of fluid/congestion. R ear may have perforated ear drum with congestion.  Pass by our referral coordinators to schedule appointment with Dr Redmond Baseman ENT.

## 2016-03-28 NOTE — Assessment & Plan Note (Signed)
Bilateral serous otitis along with ruptured R TM.  Will refer to ENT for further management - pt agrees.  Advised stop Q tips and peroxide.  Did not start abx at this time.

## 2016-03-28 NOTE — Assessment & Plan Note (Signed)
More disheveled today. Encouraged restart AA.

## 2016-06-09 ENCOUNTER — Other Ambulatory Visit: Payer: Self-pay | Admitting: Family Medicine

## 2016-06-09 DIAGNOSIS — E119 Type 2 diabetes mellitus without complications: Secondary | ICD-10-CM

## 2016-06-09 DIAGNOSIS — Z125 Encounter for screening for malignant neoplasm of prostate: Secondary | ICD-10-CM

## 2016-06-09 DIAGNOSIS — K7010 Alcoholic hepatitis without ascites: Secondary | ICD-10-CM

## 2016-06-09 DIAGNOSIS — N400 Enlarged prostate without lower urinary tract symptoms: Secondary | ICD-10-CM

## 2016-06-09 DIAGNOSIS — E785 Hyperlipidemia, unspecified: Secondary | ICD-10-CM

## 2016-06-10 ENCOUNTER — Ambulatory Visit (INDEPENDENT_AMBULATORY_CARE_PROVIDER_SITE_OTHER): Payer: Medicare Other

## 2016-06-10 VITALS — BP 148/72 | HR 61 | Temp 97.9°F | Ht 67.0 in | Wt 136.5 lb

## 2016-06-10 DIAGNOSIS — N400 Enlarged prostate without lower urinary tract symptoms: Secondary | ICD-10-CM

## 2016-06-10 DIAGNOSIS — K7010 Alcoholic hepatitis without ascites: Secondary | ICD-10-CM | POA: Diagnosis not present

## 2016-06-10 DIAGNOSIS — Z0000 Encounter for general adult medical examination without abnormal findings: Secondary | ICD-10-CM

## 2016-06-10 DIAGNOSIS — E119 Type 2 diabetes mellitus without complications: Secondary | ICD-10-CM

## 2016-06-10 DIAGNOSIS — Z125 Encounter for screening for malignant neoplasm of prostate: Secondary | ICD-10-CM

## 2016-06-10 DIAGNOSIS — E785 Hyperlipidemia, unspecified: Secondary | ICD-10-CM | POA: Diagnosis not present

## 2016-06-10 LAB — LIPID PANEL
CHOLESTEROL: 143 mg/dL (ref 0–200)
HDL: 60.6 mg/dL (ref >39.00)
LDL CALC: 60 mg/dL (ref 0–99)
NonHDL: 82.29
TRIGLYCERIDES: 109 mg/dL (ref 0.0–149.0)
Total CHOL/HDL Ratio: 2
VLDL: 21.8 mg/dL (ref 0.0–40.0)

## 2016-06-10 LAB — COMPREHENSIVE METABOLIC PANEL
ALBUMIN: 4.7 g/dL (ref 3.5–5.2)
ALT: 93 U/L — ABNORMAL HIGH (ref 0–53)
AST: 48 U/L — ABNORMAL HIGH (ref 0–37)
Alkaline Phosphatase: 125 U/L — ABNORMAL HIGH (ref 39–117)
BUN: 15 mg/dL (ref 6–23)
CALCIUM: 9.8 mg/dL (ref 8.4–10.5)
CHLORIDE: 99 meq/L (ref 96–112)
CO2: 32 mEq/L (ref 19–32)
CREATININE: 0.79 mg/dL (ref 0.40–1.50)
GFR: 100.56 mL/min (ref >60.00)
Glucose, Bld: 118 mg/dL — ABNORMAL HIGH (ref 70–99)
POTASSIUM: 4.6 meq/L (ref 3.5–5.1)
Sodium: 138 mEq/L (ref 135–145)
Total Bilirubin: 0.5 mg/dL (ref 0.2–1.2)
Total Protein: 7 g/dL (ref 6.0–8.3)

## 2016-06-10 LAB — CBC WITH DIFFERENTIAL/PLATELET
Basophils Absolute: 0 10*3/uL (ref 0.0–0.1)
Basophils Relative: 0.6 % (ref 0.0–3.0)
EOS PCT: 1.7 % (ref 0.0–5.0)
Eosinophils Absolute: 0.1 10*3/uL (ref 0.0–0.7)
HCT: 41.2 % (ref 39.0–52.0)
HEMOGLOBIN: 13.9 g/dL (ref 13.0–17.0)
Lymphocytes Relative: 22.2 % (ref 12.0–46.0)
Lymphs Abs: 1.3 10*3/uL (ref 0.7–4.0)
MCHC: 33.6 g/dL (ref 30.0–36.0)
MCV: 90.6 fl (ref 78.0–100.0)
MONO ABS: 0.7 10*3/uL (ref 0.1–1.0)
MONOS PCT: 11.6 % (ref 3.0–12.0)
Neutro Abs: 3.8 10*3/uL (ref 1.4–7.7)
Neutrophils Relative %: 63.9 % (ref 43.0–77.0)
Platelets: 205 10*3/uL (ref 150.0–400.0)
RBC: 4.55 Mil/uL (ref 4.22–5.81)
RDW: 14.4 % (ref 11.5–15.5)
WBC: 6 10*3/uL (ref 4.0–10.5)

## 2016-06-10 LAB — HEMOGLOBIN A1C: HEMOGLOBIN A1C: 6.7 % — AB (ref 4.6–6.5)

## 2016-06-10 LAB — PSA, MEDICARE: PSA: 0.36 ng/ml (ref 0.10–4.00)

## 2016-06-10 NOTE — Patient Instructions (Signed)
Justin Benson , Thank you for taking time to come for your Medicare Wellness Visit. I appreciate your ongoing commitment to your health goals. Please review the following plan we discussed and let me know if I can assist you in the future.   These are the goals we discussed: Goals    . Increase physical activity          Starting 06/10/2016, I will continue to exercise at least 30 min 3-5 days per week.        This is a list of the screening recommended for you and due dates:  Health Maintenance  Topic Date Due  . Complete foot exam   12/04/2016*  . DTaP/Tdap/Td vaccine (1 - Tdap) 08/29/2019*  . Hemoglobin A1C  12/08/2016  . Eye exam for diabetics  12/26/2016  . Tetanus Vaccine  08/29/2019  . Flu Shot  Addressed  . Shingles Vaccine  Completed  . Pneumonia vaccines  Completed  *Topic was postponed. The date shown is not the original due date.   Preventive Care for Adults  A healthy lifestyle and preventive care can promote health and wellness. Preventive health guidelines for adults include the following key practices.  . A routine yearly physical is a good way to check with your health care provider about your health and preventive screening. It is a chance to share any concerns and updates on your health and to receive a thorough exam.  . Visit your dentist for a routine exam and preventive care every 6 months. Brush your teeth twice a day and floss once a day. Good oral hygiene prevents tooth decay and gum disease.  . The frequency of eye exams is based on your age, health, family medical history, use  of contact lenses, and other factors. Follow your health care provider's ecommendations for frequency of eye exams.  . Eat a healthy diet. Foods like vegetables, fruits, whole grains, low-fat dairy products, and lean protein foods contain the nutrients you need without too many calories. Decrease your intake of foods high in solid fats, added sugars, and salt. Eat the right amount  of calories for you. Get information about a proper diet from your health care provider, if necessary.  . Regular physical exercise is one of the most important things you can do for your health. Most adults should get at least 150 minutes of moderate-intensity exercise (any activity that increases your heart rate and causes you to sweat) each week. In addition, most adults need muscle-strengthening exercises on 2 or more days a week.  Silver Sneakers may be a benefit available to you. To determine eligibility, you may visit the website: www.silversneakers.com or contact program at (901) 619-4514 Mon-Fri between 8AM-8PM.   . Maintain a healthy weight. The body mass index (BMI) is a screening tool to identify possible weight problems. It provides an estimate of body fat based on height and weight. Your health care provider can find your BMI and can help you achieve or maintain a healthy weight.   For adults 20 years and older: ? A BMI below 18.5 is considered underweight. ? A BMI of 18.5 to 24.9 is normal. ? A BMI of 25 to 29.9 is considered overweight. ? A BMI of 30 and above is considered obese.   . Maintain normal blood lipids and cholesterol levels by exercising and minimizing your intake of saturated fat. Eat a balanced diet with plenty of fruit and vegetables. Blood tests for lipids and cholesterol should begin at age 82  and be repeated every 5 years. If your lipid or cholesterol levels are high, you are over 50, or you are at high risk for heart disease, you may need your cholesterol levels checked more frequently. Ongoing high lipid and cholesterol levels should be treated with medicines if diet and exercise are not working.  . If you smoke, find out from your health care provider how to quit. If you do not use tobacco, please do not start.  . If you choose to drink alcohol, please do not consume more than 2 drinks per day. One drink is considered to be 12 ounces (355 mL) of beer, 5 ounces  (148 mL) of wine, or 1.5 ounces (44 mL) of liquor.  . If you are 82-78 years old, ask your health care provider if you should take aspirin to prevent strokes.  . Use sunscreen. Apply sunscreen liberally and repeatedly throughout the day. You should seek shade when your shadow is shorter than you. Protect yourself by wearing long sleeves, pants, a wide-brimmed hat, and sunglasses year round, whenever you are outdoors.  . Once a month, do a whole body skin exam, using a mirror to look at the skin on your back. Tell your health care provider of new moles, moles that have irregular borders, moles that are larger than a pencil eraser, or moles that have changed in shape or color.

## 2016-06-10 NOTE — Progress Notes (Signed)
PCP notes:   Health maintenance:  Flu vaccine - per pt, administered on 04/26/2016 A1C - completed   Abnormal screenings:   Hearing - failed Depression score: 6  Patient concerns:   Pt has ongoing concerns with alcohol use. Currently attending [place] meetings.  Nurse concerns:  None  Next PCP appt:   06/24/16 @ 1430

## 2016-06-10 NOTE — Progress Notes (Signed)
Pre visit review using our clinic review tool, if applicable. No additional management support is needed unless otherwise documented below in the visit note. 

## 2016-06-10 NOTE — Progress Notes (Signed)
Subjective:   Justin Benson is a 81 y.o. male who presents for Medicare Annual/Subsequent preventive examination.  Review of Systems:  N/A Cardiac Risk Factors include: advanced age (>53men, >81 women);male gender;dyslipidemia;diabetes mellitus;hypertension     Objective:    Vitals: BP (!) 148/72 (BP Location: Left Arm, Patient Position: Sitting, Cuff Size: Normal)   Pulse 61   Temp 97.9 F (36.6 C) (Oral)   Ht 5\' 7"  (1.702 m) Comment: no shoes  Wt 136 lb 8 oz (61.9 kg)   SpO2 99%   BMI 21.38 kg/m   Body mass index is 21.38 kg/m.  Tobacco History  Smoking Status  . Former Smoker  . Packs/day: 2.00  . Years: 30.00  . Types: Cigarettes  . Start date: 07/29/1962  . Quit date: 07/29/1992  Smokeless Tobacco  . Never Used     Counseling given: No   Past Medical History:  Diagnosis Date  . AAA (abdominal aortic aneurysm) ([place]) 05/2012   mild 2.3 cm , rpt 1 year  . Alcoholic hepatitis without ascites 06/02/2014   Normal viral hep panel, normal abd [place] (2015)  . Arthritis    fingers and hip  . BPH (benign prostatic hypertrophy)   . Choledocholithiasis 11/2012   s/p ERCP, s/p lap chole  . COPD (chronic obstructive pulmonary disease) ([place])   . Depression   . Diabetes mellitus 4/02  . GERD (gastroesophageal reflux disease)   . Hyperlipidemia   . Hypertension 1996  . Pneumonia as a baby  . 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 Schwannoma of spinal cord ([place]) 11/2012, 09/2015   medial to R psoas muscle, 4cm size  . 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 CHOLECYSTECTOMY N/A 03/08/2013   Procedure: LAPAROSCOPIC CHOLECYSTECTOMY WITH INTRAOPERATIVE CHOLANGIOGRAM;  Surgeon: Harl Bowie, MD;  Location: [place];  Service: General;  Laterality: N/A;  . CIRCUMCISION  10/30/09   Dr Gaynelle Arabian  . COLONOSCOPY  11/2008   2 small polyps (1 tubular), diverticulosis, lipoma, int/ext hemorrhoids [name])  .  COLONOSCOPY  02/2014   1 polyp, diverticulosis, no rpt needed [name])  . ENDOSCOPIC RETROGRADE CHOLANGIOPANCREATOGRAPHY (ERCP) WITH PROPOFOL N/A 12/24/2012   Procedure: ENDOSCOPIC RETROGRADE CHOLANGIOPANCREATOGRAPHY (ERCP) WITH PROPOFOL;  Surgeon: Milus Banister, MD;  Location: [facility];  Service: Endoscopy;  Laterality: N/A;  . LAPAROSCOPIC CHOLECYSTECTOMY  02/2013   Dr. Ninfa Linden   Family History  Problem Relation Age of Onset  . Prostate cancer Father   . Aneurysm Father     aortic  . Stroke Father   . Hodgkin's lymphoma Brother 55  . Prostate cancer Brother 38    1/2  . Prostate cancer Brother 88    1/2  . Other Sister     cerebral hemm  . Lupus Sister   . Diabetes Brother   . Colon cancer Neg Hx    History  Sexual Activity  . Sexual activity: Yes    Outpatient Encounter Prescriptions as of 06/10/2016  Medication Sig  . amLODipine (NORVASC) 10 MG tablet Take 1 tablet (10 mg total) by mouth daily before breakfast.  . aspirin 81 MG tablet Take 81 mg by mouth daily.    Marland Kitchen atorvastatin (LIPITOR) 10 MG tablet Take 0.5 tablets (5 mg total) by mouth every evening.  . finasteride (PROSCAR) 5 MG tablet Take 1 tablet (5 mg total) by mouth every evening.  . metFORMIN (GLUCOPHAGE) 500 MG tablet  Take 1 tablet (500 mg total) by mouth 2 (two) times daily with a meal.  . Multiple Vitamin (MULTIVITAMIN WITH MINERALS) TABS Take 1 tablet by mouth daily.  . quinapril (ACCUPRIL) 20 MG tablet Take 1 tablet (20 mg total) by mouth at bedtime.  . quinapril-hydrochlorothiazide (ACCURETIC) 20-25 MG tablet Take 1 tablet by mouth daily.  . sertraline (ZOLOFT) 100 MG tablet Take 1 and 1/2 tablets by  mouth daily   No facility-administered encounter medications on file as of 06/10/2016.     Activities of Daily Living In your present state of health, do you have any difficulty performing the following activities: 06/10/2016  Hearing? N  Vision? N  Difficulty concentrating or making decisions? Y    Walking or climbing stairs? N  Dressing or bathing? N  Doing errands, shopping? N  Preparing Food and eating ? N  Using the Toilet? N  In the past six months, have you accidently leaked urine? N  Do you have problems with loss of bowel control? Y  Managing your Medications? N  Managing your Finances? N  Housekeeping or managing your Housekeeping? N  Some recent data might be hidden    Patient Care Team: [name], MD as PCP - General (Family Medicine) Melida Quitter, MD as Consulting Physician (Otolaryngology) Harriett Sine, MD as Consulting Physician (Dermatology)   Assessment:     Hearing Screening   125Hz  250Hz  500Hz  1000Hz  2000Hz  3000Hz  4000Hz  6000Hz  8000Hz   Right ear:   0 0 0  0    Left ear:   0 40 40  0    Vision Screening Comments: Last vision exam in May 2017   Exercise Activities and Dietary recommendations Current Exercise Habits: Home exercise routine, Type of exercise: walking;strength training/weights, Time (Minutes): 45, Frequency (Times/Week): 4, Weekly Exercise (Minutes/Week): 180, Intensity: Moderate, Exercise limited by: None identified  Goals    . Increase physical activity          Starting 06/10/2016, I will continue to exercise at least 30 min 3-5 days per week.       Fall Risk Fall Risk  06/10/2016 06/10/2016 06/07/2015 06/02/2014 06/01/2013  Falls in the past year? No No No No Yes  Number falls in past yr: - - - - 1  Injury with Fall? - - - - Yes  Risk for fall due to : - - - - Other (Comment)  Risk for fall due to (comments): - - - - Weather   Depression Screen PHQ 2/9 Scores 06/10/2016 06/07/2015 06/02/2014 06/01/2013  PHQ - 2 Score 2 2 0 1  PHQ- 9 Score 6 2 - -    Cognitive Function MMSE - Mini Mental State Exam 06/10/2016  Orientation to time 5  Orientation to Place 5  Registration 3  Attention/ Calculation 0  Recall 3  Language- name 2 objects 0  Language- repeat 1  Language- follow 3 step command 3  Language- read & follow  direction 0  Write a sentence 0  Copy design 0  Total score 20     PLEASE NOTE: A Mini-Cog screen was completed. Maximum score is 20. A value of 0 denotes this part of [place] MMSE was not completed or the patient failed this part of the Mini-Cog screening.   Mini-Cog Screening Orientation to Time - Max 5 pts Orientation to Place - Max 5 pts Registration - Max 3 pts Recall - Max 3 pts Language Repeat - Max 1 pts Language Follow 3 Step Command - Max  3 pts     Immunization History  Administered Date(s) Administered  . Influenza Split 05/08/2011, 05/29/2012  . Influenza Whole 04/28/2006, 06/17/2007, 05/15/2008, 05/07/2010  . Influenza,inj,Quad PF,36+ Mos 06/02/2014, 06/07/2015  . Influenza-Unspecified 05/17/2013  . Pneumococcal Conjugate-13 06/02/2014  . Pneumococcal Polysaccharide-23 06/29/2003  . Td 12/28/1998, 08/28/2009  . Zoster 04/03/2011   Screening Tests Health Maintenance  Topic Date Due  . FOOT EXAM  12/04/2016 (Originally 12/06/2015)  . DTaP/Tdap/Td (1 - Tdap) 08/29/2019 (Originally 08/29/2009)  . HEMOGLOBIN A1C  12/08/2016  . OPHTHALMOLOGY EXAM  12/26/2016  . TETANUS/TDAP  08/29/2019  . INFLUENZA VACCINE  Addressed  . ZOSTAVAX  Completed  . PNA vac Low Risk Adult  Completed      Plan:    I have personally reviewed and addressed the Medicare Annual Wellness questionnaire and have noted the following in the patient's chart:  A. Medical and social history B. Use of alcohol, tobacco or illicit drugs  C. Current medications and supplements D. Functional ability and status E.  Nutritional status F.  Physical activity G. Advance directives H. List of other physicians I.  Hospitalizations, surgeries, and ER visits in previous 12 months J.  [place] to include hearing, vision, cognitive, depression L. Referrals and appointments - none  In addition, I have reviewed and discussed with patient certain preventive protocols, quality metrics, and best  practice recommendations. A written personalized care plan for preventive services as well as general preventive health recommendations were provided to patient.  See attached scanned questionnaire for additional information.   Signed,   Lindell Noe, MHA, BS, LPN Health Coach

## 2016-06-16 ENCOUNTER — Other Ambulatory Visit: Payer: Self-pay | Admitting: Family Medicine

## 2016-06-16 MED ORDER — ATORVASTATIN CALCIUM 10 MG PO TABS: 5.0000 mg | ORAL_TABLET | Freq: Every evening | ORAL | 1 refills | Status: DC

## 2016-06-16 MED ORDER — FINASTERIDE 5 MG PO TABS: 5.0000 mg | ORAL_TABLET | Freq: Every evening | ORAL | 1 refills | Status: DC

## 2016-06-16 MED ORDER — AMLODIPINE BESYLATE 10 MG PO TABS: 10.0000 mg | ORAL_TABLET | Freq: Every day | ORAL | 1 refills | Status: DC

## 2016-06-16 MED ORDER — QUINAPRIL-HYDROCHLOROTHIAZIDE 20-25 MG PO TABS: 1.0000 | ORAL_TABLET | Freq: Every day | ORAL | 1 refills | Status: DC

## 2016-06-16 MED ORDER — SERTRALINE HCL 100 MG PO TABS: 150.0000 mg | ORAL_TABLET | Freq: Every day | ORAL | 1 refills | Status: DC

## 2016-06-16 MED ORDER — QUINAPRIL HCL 20 MG PO TABS: 20.0000 mg | ORAL_TABLET | Freq: Every day | ORAL | 1 refills | Status: DC

## 2016-06-24 ENCOUNTER — Ambulatory Visit (INDEPENDENT_AMBULATORY_CARE_PROVIDER_SITE_OTHER): Payer: Medicare Other | Admitting: Family Medicine

## 2016-06-24 ENCOUNTER — Encounter: Payer: Self-pay | Admitting: Family Medicine

## 2016-06-24 VITALS — BP 146/72 | HR 61 | Temp 97.4°F | Resp 16 | Ht 67.0 in | Wt 137.0 lb

## 2016-06-24 DIAGNOSIS — N400 Enlarged prostate without lower urinary tract symptoms: Secondary | ICD-10-CM

## 2016-06-24 DIAGNOSIS — Z0000 Encounter for general adult medical examination without abnormal findings: Secondary | ICD-10-CM | POA: Diagnosis not present

## 2016-06-24 DIAGNOSIS — H7290 Unspecified perforation of tympanic membrane, unspecified ear: Secondary | ICD-10-CM

## 2016-06-24 DIAGNOSIS — E119 Type 2 diabetes mellitus without complications: Secondary | ICD-10-CM | POA: Diagnosis not present

## 2016-06-24 DIAGNOSIS — J449 Chronic obstructive pulmonary disease, unspecified: Secondary | ICD-10-CM

## 2016-06-24 DIAGNOSIS — I10 Essential (primary) hypertension: Secondary | ICD-10-CM

## 2016-06-24 DIAGNOSIS — E785 Hyperlipidemia, unspecified: Secondary | ICD-10-CM

## 2016-06-24 DIAGNOSIS — K7010 Alcoholic hepatitis without ascites: Secondary | ICD-10-CM

## 2016-06-24 DIAGNOSIS — G8929 Other chronic pain: Secondary | ICD-10-CM | POA: Insufficient documentation

## 2016-06-24 DIAGNOSIS — F1010 Alcohol abuse, uncomplicated: Secondary | ICD-10-CM

## 2016-06-24 DIAGNOSIS — Z7189 Other specified counseling: Secondary | ICD-10-CM | POA: Diagnosis not present

## 2016-06-24 DIAGNOSIS — F331 Major depressive disorder, recurrent, moderate: Secondary | ICD-10-CM

## 2016-06-24 DIAGNOSIS — M545 Low back pain: Secondary | ICD-10-CM

## 2016-06-24 DIAGNOSIS — D334 Benign neoplasm of spinal cord: Secondary | ICD-10-CM

## 2016-06-24 DIAGNOSIS — I700 Atherosclerosis of aorta: Secondary | ICD-10-CM

## 2016-06-24 DIAGNOSIS — H6590 Unspecified nonsuppurative otitis media, unspecified ear: Secondary | ICD-10-CM

## 2016-06-24 MED ORDER — SERTRALINE HCL 100 MG PO TABS: 100.0000 mg | ORAL_TABLET | Freq: Every day | ORAL | 3 refills | Status: DC

## 2016-06-24 NOTE — Assessment & Plan Note (Signed)
Advanced directives: copy in chart 11/2013. HCPOA would be daughter, Elizabeth Cleary in [place]. Would be ok with temporary measures - feeding tube, breathing tube, but doesn't want prolonged life support  

## 2016-06-24 NOTE — Assessment & Plan Note (Signed)
Chronic, stable. Continue lipitor 40mg daily 

## 2016-06-24 NOTE — Assessment & Plan Note (Signed)
Chronic, stable. Continue current regimen. 

## 2016-06-24 NOTE — Assessment & Plan Note (Signed)
Reviewed MRI from last year with patient - known L3/4 DDD as well as bilateral hip arthritis. Pt will try friend's TENS unit and let me know if effective for Rx.

## 2016-06-24 NOTE — Assessment & Plan Note (Signed)
Preventative protocols reviewed and updated unless pt declined. Discussed healthy diet and lifestyle.  

## 2016-06-24 NOTE — Assessment & Plan Note (Signed)
Minimal off meds. remote smoker

## 2016-06-24 NOTE — Assessment & Plan Note (Signed)
Chronic, stable. Would like to decrease to 100mg  dose.

## 2016-06-24 NOTE — Assessment & Plan Note (Signed)
Appreciate ENT assistance. TM seems to be healing well.

## 2016-06-24 NOTE — Assessment & Plan Note (Signed)
Improved transaminitis with alcohol cessation. Continue AA.

## 2016-06-24 NOTE — Assessment & Plan Note (Signed)
Continue aspirin, lipitor.  

## 2016-06-24 NOTE — Progress Notes (Signed)
Pre visit review using our clinic review tool, if applicable. No additional management support is needed unless otherwise documented below in the visit note/SLS  

## 2016-06-24 NOTE — Patient Instructions (Addendum)
Decrease sertraline to 100mg  daily, let me know how you do.  You are doing well. Keep up the good work with decreased alcohol! Return as needed or in 6 months for follow up visit.  May try TENS unit - let me know if helpful and I will write prescription.   Health Maintenance, Male A healthy lifestyle and preventative care can promote health and wellness.  Maintain regular health, dental, and eye exams.  Eat a healthy diet. Foods like vegetables, fruits, whole grains, low-fat dairy products, and lean protein foods contain the nutrients you need and are low in calories. Decrease your intake of foods high in solid fats, added sugars, and salt. Get information about a proper diet from your health care provider, if necessary.  Regular physical exercise is one of the most important things you can do for your health. Most adults should get at least 150 minutes of moderate-intensity exercise (any activity that increases your heart rate and causes you to sweat) each week. In addition, most adults need muscle-strengthening exercises on 2 or more days a week.   Maintain a healthy weight. The body mass index (BMI) is a screening tool to identify possible weight problems. It provides an estimate of body fat based on height and weight. Your health care provider can find your BMI and can help you achieve or maintain a healthy weight. For males 20 years and older:  A BMI below 18.5 is considered underweight.  A BMI of 18.5 to 24.9 is normal.  A BMI of 25 to 29.9 is considered overweight.  A BMI of 30 and above is considered obese.  Maintain normal blood lipids and cholesterol by exercising and minimizing your intake of saturated fat. Eat a balanced diet with plenty of fruits and vegetables. Blood tests for lipids and cholesterol should begin at age 13 and be repeated every 5 years. If your lipid or cholesterol levels are high, you are over age 40, or you are at high risk for heart disease, you may need your  cholesterol levels checked more frequently.Ongoing high lipid and cholesterol levels should be treated with medicines if diet and exercise are not working.  If you smoke, find out from your health care provider how to quit. If you do not use tobacco, do not start.  Lung cancer screening is recommended for adults aged 16-80 years who are at high risk for developing lung cancer because of a history of smoking. A yearly low-dose CT scan of the lungs is recommended for people who have at least a 30-pack-year history of smoking and are current smokers or have quit within the past 15 years. A pack year of smoking is smoking an average of 1 pack of cigarettes a day for 1 year (for example, a 30-pack-year history of smoking could mean smoking 1 pack a day for 30 years or 2 packs a day for 15 years). Yearly screening should continue until the smoker has stopped smoking for at least 15 years. Yearly screening should be stopped for people who develop a health problem that would prevent them from having lung cancer treatment.  If you choose to drink alcohol, do not have more than 2 drinks per day. One drink is considered to be 12 oz (360 mL) of beer, 5 oz (150 mL) of wine, or 1.5 oz (45 mL) of liquor.  Avoid the use of street drugs. Do not share needles with anyone. Ask for help if you need support or instructions about stopping the use of  drugs.  High blood pressure causes heart disease and increases the risk of stroke. High blood pressure is more likely to develop in:  People who have blood pressure in the end of the normal range (100-139/85-89 mm Hg).  People who are overweight or obese.  People who are African American.  If you are 38-58 years of age, have your blood pressure checked every 3-5 years. If you are 53 years of age or older, have your blood pressure checked every year. You should have your blood pressure measured twice-once when you are at a hospital or clinic, and once when you are not at a  hospital or clinic. Record the average of the two measurements. To check your blood pressure when you are not at a hospital or clinic, you can use:  An automated blood pressure machine at a pharmacy.  A home blood pressure monitor.  If you are 21-80 years old, ask your health care provider if you should take aspirin to prevent heart disease.  Diabetes screening involves taking a blood sample to check your fasting blood sugar level. This should be done once every 3 years after age 56 if you are at a normal weight and without risk factors for diabetes. Testing should be considered at a younger age or be carried out more frequently if you are overweight and have at least 1 risk factor for diabetes.  Colorectal cancer can be detected and often prevented. Most routine colorectal cancer screening begins at the age of 24 and continues through age 70. However, your health care provider may recommend screening at an earlier age if you have risk factors for colon cancer. On a yearly basis, your health care provider may provide home test kits to check for hidden blood in the stool. A small camera at the end of a tube may be used to directly examine the colon (sigmoidoscopy or colonoscopy) to detect the earliest forms of colorectal cancer. Talk to your health care provider about this at age 60 when routine screening begins. A direct exam of the colon should be repeated every 5-10 years through age 69, unless early forms of precancerous polyps or small growths are found.  People who are at an increased risk for hepatitis B should be screened for this virus. You are considered at high risk for hepatitis B if:  You were born in a country where hepatitis B occurs often. Talk with your health care provider about which countries are considered high risk.  Your parents were born in a high-risk country and you have not received a shot to protect against hepatitis B (hepatitis B vaccine).  You have HIV or AIDS.  You  use needles to inject street drugs.  You live with, or have sex with, someone who has hepatitis B.  You are a man who has sex with other men (MSM).  You get hemodialysis treatment.  You take certain medicines for conditions like cancer, organ transplantation, and autoimmune conditions.  Hepatitis C blood testing is recommended for all people born from [birth year] through 1963 and any individual with known risk factors for hepatitis C.  Healthy men should no longer receive prostate-specific antigen (PSA) blood tests as part of routine cancer screening. Talk to your health care provider about prostate cancer screening.  Testicular cancer screening is not recommended for adolescents or adult males who have no symptoms. Screening includes self-exam, a health care provider exam, and other screening tests. Consult with your health care provider about any symptoms you have  or any concerns you have about testicular cancer.  Practice safe sex. Use condoms and avoid high-risk sexual practices to reduce the spread of sexually transmitted infections (STIs).  You should be screened for STIs, including gonorrhea and chlamydia if:  You are sexually active and are younger than 24 years.  You are older than 24 years, and your health care provider tells you that you are at risk for this type of infection.  Your sexual activity has changed since you were last screened, and you are at an increased risk for chlamydia or gonorrhea. Ask your health care provider if you are at risk.  If you are at risk of being infected with HIV, it is recommended that you take a prescription medicine daily to prevent HIV infection. This is called pre-exposure prophylaxis (PrEP). You are considered at risk if:  You are a man who has sex with other men (MSM).  You are a heterosexual man who is sexually active with multiple partners.  You take drugs by injection.  You are sexually active with a partner who has HIV.  Talk with  your health care provider about whether you are at high risk of being infected with HIV. If you choose to begin PrEP, you should first be tested for HIV. You should then be tested every 3 months for as long as you are taking PrEP.  Use sunscreen. Apply sunscreen liberally and repeatedly throughout the day. You should seek shade when your shadow is shorter than you. Protect yourself by wearing long sleeves, pants, a wide-brimmed hat, and sunglasses year round whenever you are outdoors.  Tell your health care provider of new moles or changes in moles, especially if there is a change in shape or color. Also, tell your health care provider if a mole is larger than the size of a pencil eraser.  A one-time screening for abdominal aortic aneurysm (AAA) and surgical repair of large AAAs by ultrasound is recommended for men aged 55-75 years who are current or former smokers.  Stay current with your vaccines (immunizations). This information is not intended to replace advice given to you by your health care provider. Make sure you discuss any questions you have with your health care provider. Document Released: 01/11/2008 Document Revised: 08/05/2014 Document Reviewed: 04/18/2015 Elsevier Interactive Patient Education  2017 [employer].

## 2016-06-24 NOTE — Assessment & Plan Note (Addendum)
Chronic, stable on metformin 500mg  bid. Continue. Foot exam today.

## 2016-06-24 NOTE — Assessment & Plan Note (Signed)
Seeing AA. Has stopped drinking over last 1+ month.

## 2016-06-24 NOTE — Assessment & Plan Note (Signed)
Chronic, stable. Continue finasteride and DRE/PSA. Discussed likely will be able to discontinue screening over next few years.

## 2016-06-24 NOTE — Progress Notes (Signed)
I reviewed health advisor's note, was available for consultation, and agree with documentation and plan.  

## 2016-06-24 NOTE — Progress Notes (Signed)
BP (!) 146/72 (BP Location: Left Arm, Patient Position: Sitting, Cuff Size: Normal)   Pulse 61   Temp 97.4 F (36.3 C) (Oral)   Resp 16   Ht 5\' 7"  (1.702 m)   Wt 137 lb (62.1 kg)   SpO2 98%   BMI 21.46 kg/m    CC: CPE Subjective:    Patient ID: Justin Benson, male    DOB: Apr 12, 1935, 81 y.o.   MRN: [id number]  HPI: Justin Benson is a 81 y.o. male presenting on 06/24/2016 for Annual Exam (CBGs are varying from 103-146; ); Basal Cell Carcinoma (Patient reports having Basal cell removed from Right temple x6 wks ago); Ear Problem (Pt reports seeing ENT, given fungal Rx for Right ear that burned upon application; feels perferation not getting better, has F/U in December); Alcohol Problem (Patient reports doing better with Alcoholism); and Hip Pain (Pt c/o Arthritic right hip & lower back pain ; would like to discuss TENS Unit)   Saw Katha Cabal for medicare wellness visit. Note reviewed.   Decreasing alcohol intake - no drink in last month except for recent binge. Did start attending [place] meetings.  Ruptured R TM - saw Dr Redmond Baseman ENT, pending f/u next month.  Diabetic Foot Exam - Simple   Simple Foot Form Diabetic Foot exam was performed with the following findings:  Yes 06/24/2016  3:17 PM  Visual Inspection No deformities, no ulcerations, no other skin breakdown bilaterally:  Yes Sensation Testing Intact to touch and monofilament testing bilaterally:  Yes Pulse Check Posterior Tibialis and Dorsalis pulse intact bilaterally:  Yes Comments      Ongoing LBP with R hip pain for years - points to lateral hip and R buttock. Asks about TENS unit. Not as severe as previously. Sleeps on hard cushion which helps. Known arthritis of hips and DDD of spine. Known schwannoma previously followed by Dr Trenton Gammon and stable.  Preventative: COLONOSCOPY Date: 02/2014 1 polyp, diverticulosis, no rpt needed [name]) Prostate cancer screening - strong fmhx prostate cancer. Personal h/o BPH on finasteride.  Yearly checks.  Flu shot yearly (pharmacy) Td 2011  Pneumovax 2004, prevnar 2015 zostavax 2012  Advanced directives: copy in chart 11/2013. HCPOA would be daughter, Saw Perelli in [place]. Would be ok with temporary measures - feeding tube, breathing tube, but doesn't want prolonged life support Seat belt use discussed Sunscreen use discussed. No changing moles on skin. Ex smoker Alcohol - cutting back. H/o alcohol abuse.  Does crossword puzzles, reads.  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  Relevant past medical, surgical, family and social history reviewed and updated as indicated. Interim medical history since our last visit reviewed. Allergies and medications reviewed and updated. Current Outpatient Prescriptions on File Prior to Visit  Medication Sig  . amLODipine (NORVASC) 10 MG tablet Take 1 tablet (10 mg total) by mouth daily before breakfast.  . aspirin 81 MG tablet Take 81 mg by mouth daily.    Marland Kitchen atorvastatin (LIPITOR) 10 MG tablet Take 0.5 tablets (5 mg total) by mouth every evening.  . finasteride (PROSCAR) 5 MG tablet Take 1 tablet (5 mg total) by mouth every evening.  . metFORMIN (GLUCOPHAGE) 500 MG tablet Take 1 tablet (500 mg total) by mouth 2 (two) times daily with a meal.  . Multiple Vitamin (MULTIVITAMIN WITH MINERALS)  TABS Take 1 tablet by mouth daily.  . quinapril (ACCUPRIL) 20 MG tablet Take 1 tablet (20 mg total) by mouth at bedtime.  . quinapril-hydrochlorothiazide (ACCURETIC) 20-25 MG tablet Take 1 tablet by mouth daily.   No current facility-administered medications on file prior to visit.     Review of Systems  Constitutional: Negative for activity change, appetite change, chills, fatigue, fever and unexpected weight  change.  HENT: Negative for hearing loss.   Eyes: Negative for visual disturbance.  Respiratory: Negative for cough, chest tightness, shortness of breath and wheezing.   Cardiovascular: Negative for chest pain, palpitations and leg swelling.  Gastrointestinal: Negative for abdominal distention, abdominal pain, blood in stool, constipation, diarrhea, nausea and vomiting.  Genitourinary: Negative for difficulty urinating and hematuria.  Musculoskeletal: Negative for arthralgias, myalgias and neck pain.  Skin: Negative for rash.  Neurological: Positive for dizziness (unsteady with sudden movements since amlodipine started). Negative for seizures, syncope and headaches.  Hematological: Negative for adenopathy. Does not bruise/bleed easily.  Psychiatric/Behavioral: Negative for dysphoric mood. The patient is not nervous/anxious.    Per HPI unless specifically indicated in ROS section     Objective:    BP (!) 146/72 (BP Location: Left Arm, Patient Position: Sitting, Cuff Size: Normal)   Pulse 61   Temp 97.4 F (36.3 C) (Oral)   Resp 16   Ht 5\' 7"  (1.702 m)   Wt 137 lb (62.1 kg)   SpO2 98%   BMI 21.46 kg/m   Wt Readings from Last 3 Encounters:  06/24/16 137 lb (62.1 kg)  06/10/16 136 lb 8 oz (61.9 kg)  03/28/16 138 lb 8 oz (62.8 kg)    Physical Exam  Constitutional: He is oriented to person, place, and time. He appears well-developed and well-nourished. No distress.  HENT:  Head: Normocephalic and atraumatic.  Right Ear: Hearing, external ear and ear canal normal.  Left Ear: Hearing, tympanic membrane, external ear and ear canal normal.  Nose: Nose normal.  Mouth/Throat: Uvula is midline, oropharynx is clear and moist and mucous membranes are normal. No oropharyngeal exudate, posterior oropharyngeal edema or posterior oropharyngeal erythema.  R TM seems healed at this time  Eyes: Conjunctivae and EOM are normal. Pupils are equal, round, and reactive to light. No scleral icterus.    Neck: Normal range of motion. Neck supple. Carotid bruit is not present. No thyromegaly present.  Cardiovascular: Normal rate, regular rhythm, normal heart sounds and intact distal pulses.   No murmur heard. Pulses:      Radial pulses are 2+ on the right side, and 2+ on the left side.  Pulmonary/Chest: Effort normal and breath sounds normal. No respiratory distress. He has no wheezes. He has no rales.  Abdominal: Soft. Bowel sounds are normal. He exhibits no distension and no mass. There is no tenderness. There is no rebound and no guarding.  Genitourinary: Rectum normal. Rectal exam shows no external hemorrhoid, no internal hemorrhoid, no fissure, no mass, no tenderness and anal tone normal. Prostate is enlarged (30gm). Prostate is not tender.  Musculoskeletal: Normal range of motion. He exhibits no edema.  See HPI for foot exam if done No pain midline spine No paraspinous mm tenderness Neg SLR bilaterally. No pain with int/ext rotation at hip. Neg FABER. No pain at SIJ, GTB or sciatic notch bilaterally.   Lymphadenopathy:    He has no cervical adenopathy.  Neurological: He is alert and oriented to person, place, and time.  CN grossly intact, station and gait intact 5/5  strength BLE  Skin: Skin is warm and dry. No rash noted.  Psychiatric: He has a normal mood and affect. His behavior is normal. Judgment and thought content normal.  Nursing note and vitals reviewed.  Results for orders placed or performed in visit on 06/10/16  PSA, Medicare  Result Value Ref Range   PSA 0.36 0.10 - 4.00 ng/ml  Lipid panel  Result Value Ref Range   Cholesterol 143 0 - 200 mg/dL   Triglycerides 109.0 0.0 - 149.0 mg/dL   HDL 60.60 >39.00 mg/dL   VLDL 21.8 0.0 - 40.0 mg/dL   LDL Cholesterol 60 0 - 99 mg/dL   Total CHOL/HDL Ratio 2    NonHDL 82.29   Comprehensive metabolic panel  Result Value Ref Range   Sodium 138 135 - 145 mEq/L   Potassium 4.6 3.5 - 5.1 mEq/L   Chloride 99 96 - 112 mEq/L    CO2 32 19 - 32 mEq/L   Glucose, Bld 118 (H) 70 - 99 mg/dL   BUN 15 6 - 23 mg/dL   Creatinine, Ser 0.79 0.40 - 1.50 mg/dL   Total Bilirubin 0.5 0.2 - 1.2 mg/dL   Alkaline Phosphatase 125 (H) 39 - 117 U/L   AST 48 (H) 0 - 37 U/L   ALT 93 (H) 0 - 53 U/L   Total Protein 7.0 6.0 - 8.3 g/dL   Albumin 4.7 3.5 - 5.2 g/dL   Calcium 9.8 8.4 - 10.5 mg/dL   GFR 100.56 >60.00 mL/min  Hemoglobin A1c  Result Value Ref Range   Hgb A1c MFr Bld 6.7 (H) 4.6 - 6.5 %  CBC with Differential/Platelet  Result Value Ref Range   WBC 6.0 4.0 - 10.5 K/uL   RBC 4.55 4.22 - 5.81 Mil/uL   Hemoglobin 13.9 13.0 - 17.0 g/dL   HCT 41.2 39.0 - 52.0 %   MCV 90.6 78.0 - 100.0 fl   MCHC 33.6 30.0 - 36.0 g/dL   RDW 14.4 11.5 - 15.5 %   Platelets 205.0 150.0 - 400.0 K/uL   Neutrophils Relative % 63.9 43.0 - 77.0 %   Lymphocytes Relative 22.2 12.0 - 46.0 %   Monocytes Relative 11.6 3.0 - 12.0 %   Eosinophils Relative 1.7 0.0 - 5.0 %   Basophils Relative 0.6 0.0 - 3.0 %   Neutro Abs 3.8 1.4 - 7.7 K/uL   Lymphs Abs 1.3 0.7 - 4.0 K/uL   Monocytes Absolute 0.7 0.1 - 1.0 K/uL   Eosinophils Absolute 0.1 0.0 - 0.7 K/uL   Basophils Absolute 0.0 0.0 - 0.1 K/uL      Assessment & Plan:   Problem List Items Addressed This Visit    Abdominal aortic atherosclerosis (HCC)    Continue aspirin, lipitor.      Advanced care planning/counseling discussion    Advanced directives: copy in chart 11/2013. HCPOA would be daughter, Javonnie Dicken in [place]. Would be ok with temporary measures - feeding tube, breathing tube, but doesn't want prolonged life support      Alcohol abuse    Seeing AA. Has stopped drinking over last 1+ month.       Alcoholic hepatitis without ascites    Improved transaminitis with alcohol cessation. Continue AA.       Benign prostatic hyperplasia    Chronic, stable. Continue finasteride and DRE/PSA. Discussed likely will be able to discontinue screening over next few years.       Chronic  lower back pain    Reviewed  MRI from last year with patient - known L3/4 DDD as well as bilateral hip arthritis. Pt will try friend's TENS unit and let me know if effective for Rx.      COPD (chronic obstructive pulmonary disease) (HCC)    Minimal off meds. remote smoker      Depression    Chronic, stable. Would like to decrease to 100mg  dose.       Relevant Medications   sertraline (ZOLOFT) 100 MG tablet   Diabetes type 2, controlled (HCC)    Chronic, stable on metformin 500mg  bid. Continue. Foot exam today.      Essential hypertension    Chronic, stable. Continue current regimen.      Health maintenance examination - Primary    Preventative protocols reviewed and updated unless pt declined. Discussed healthy diet and lifestyle.       HLD (hyperlipidemia)    Chronic, stable. Continue lipitor 40mg  daily.      Schwannoma of spinal cord (HCC)    Stable.       Serous otitis media with rupture of tympanic membrane    Appreciate ENT assistance. TM seems to be healing well.           Follow up plan: Return in about 6 months (around 12/22/2016) for follow up visit.  [name], MD

## 2016-06-24 NOTE — Assessment & Plan Note (Signed)
Stable

## 2016-06-26 ENCOUNTER — Other Ambulatory Visit: Payer: Self-pay | Admitting: Family Medicine

## 2016-07-08 ENCOUNTER — Other Ambulatory Visit: Payer: Self-pay | Admitting: Family Medicine

## 2016-12-13 ENCOUNTER — Telehealth: Payer: Self-pay | Admitting: Family Medicine

## 2016-12-13 NOTE — Telephone Encounter (Signed)
[facility] Patient Name: Justin Benson DOB: 1934/10/12 Initial Comment caller states he went on a birding outing this morning, he got a tick on his leg, he thinks its a lone star tick. it has been removed. Nurse Assessment Nurse: Chesley Noon, RN, Lattie Haw Date/Time Eilene Ghazi Time): 12/13/2016 3:12:02 PM Confirm and document reason for call. If symptomatic, describe symptoms. ---Caller states he went out this am and had a tick on his leg. He thinks it is a lone star tick and he has removed. He thinks it was on for 2.5 hours at the longest point. He still has it in a plastic bag, it is still alive. No symptoms currently except itching. Does the patient have any new or worsening symptoms? ---Yes Will a triage be completed? ---Yes Related visit to physician within the last 2 weeks? ---No Does the PT have any chronic conditions? (i.e. diabetes, asthma, etc.) ---Yes List chronic conditions. ---diabetes type 2 Is this a behavioral health or substance abuse call? ---No Guidelines Guideline Title Affirmed Question Affirmed Notes Tick Bite Tick bite with no complications Final Disposition [place], RN, Lattie Haw Disagree/Comply: Comply

## 2016-12-13 NOTE — Telephone Encounter (Signed)
PLEASE NOTE: All timestamps contained within this report are represented as [place] Standard Time. CONFIDENTIALTY NOTICE: This fax transmission is intended only for the addressee. It contains information that is legally privileged, confidential or otherwise protected from use or disclosure. If you are not the intended recipient, you are strictly prohibited from reviewing, disclosing, copying using or disseminating any of this information or taking any action in reliance on or regarding this information. If you have received this fax in error, please notify us immediately by telephone so that we can arrange for its return to [place]. Phone: [phone], Toll-Free: [phone], Fax: [phone] Page: 1 of 1 Call Id: [id number] [place] Patient Name: Justin Benson Gender: Male DOB: 09/20/1934 Age: 82 Y 45 M 5 D Return Phone Number: [phone] (Primary), [phone] (Secondary) City/State/Zip: [place] [address] Client [facility] - Client Client Site [place] - Day Physician [name] - MD Who Is Calling Patient / Member / Family / Caregiver Call Type Triage / Clinical Relationship To Patient Self Return Phone Number [phone] (Primary) Chief Complaint Tick Bite Reason for Call Symptomatic / Request for Health Information Initial Comment caller states he went on a birding outing this morning, he got a tick on his leg, he thinks its a lone star tick. it has been removed. Appointment Disposition EMR Appointment Not Necessary Info pasted into Epic Yes Nurse Assessment Nurse: Chesley Noon, RN, Lattie Haw Date/Time Eilene Ghazi Time): 12/13/2016 3:12:02 PM Confirm and document reason for call. If symptomatic, describe symptoms. ---Caller states he went out this am and had a tick on his leg. He thinks it is a lone star tick and he has removed. He thinks  it was on for 2.5 hours at the longest point. He still has it in a plastic bag, it is still alive. No symptoms currently except itching. Does the PT have any chronic conditions? (i.e. diabetes, asthma, etc.) ---Yes List chronic conditions. ---diabetes type 2 Guidelines Guideline Title Affirmed Question Tick Bite Tick bite with no complications Disp. Time Eilene Ghazi Time) Disposition Final User 12/13/2016 3:19:50 PM Home Care Yes Chesley Noon, RN, [facility] Advice Given Per Guideline HOME CARE: You should be able to treat this at home. REASSURANCE: Most tick bites are harmless and can be treated at home. The spread of disease by ticks is rare. ANTIBIOTIC OINTMENT: Wash the wound and your hands with soap and water after removal to prevent catching any tick disease. Apply antibiotic ointment (OTC) to the bite once. EXPECTED COURSE: Tick bites normally don't itch or hurt. That's why they often go unnoticed. TETANUS BOOSTER: If last tetanus shot was given over 10 years ago, needs a booster. Call PCP during regular office hours (within 3 days) CALL BACK IF: * Fever or rash occur in the next 2 weeks * Bite begins to look infected * You become worse. CARE ADVICE given per Tick Bites (Adult) guideline.

## 2016-12-26 ENCOUNTER — Ambulatory Visit: Payer: Medicare Other | Admitting: Family Medicine

## 2016-12-27 LAB — HM DIABETES EYE EXAM

## 2017-01-07 ENCOUNTER — Ambulatory Visit: Payer: Medicare Other | Admitting: Family Medicine

## 2017-02-12 ENCOUNTER — Ambulatory Visit (INDEPENDENT_AMBULATORY_CARE_PROVIDER_SITE_OTHER): Payer: Medicare Other | Admitting: Family Medicine

## 2017-02-12 ENCOUNTER — Encounter: Payer: Self-pay | Admitting: Family Medicine

## 2017-02-12 VITALS — BP 128/76 | HR 56 | Temp 98.1°F | Ht 67.0 in | Wt 137.0 lb

## 2017-02-12 DIAGNOSIS — I10 Essential (primary) hypertension: Secondary | ICD-10-CM

## 2017-02-12 DIAGNOSIS — F331 Major depressive disorder, recurrent, moderate: Secondary | ICD-10-CM

## 2017-02-12 DIAGNOSIS — E119 Type 2 diabetes mellitus without complications: Secondary | ICD-10-CM | POA: Diagnosis not present

## 2017-02-12 DIAGNOSIS — K7010 Alcoholic hepatitis without ascites: Secondary | ICD-10-CM | POA: Diagnosis not present

## 2017-02-12 DIAGNOSIS — F1010 Alcohol abuse, uncomplicated: Secondary | ICD-10-CM | POA: Diagnosis not present

## 2017-02-12 LAB — POC URINALSYSI DIPSTICK (AUTOMATED)
BILIRUBIN UA: NEGATIVE
Blood, UA: NEGATIVE
Glucose, UA: NEGATIVE
Ketones, UA: NEGATIVE
LEUKOCYTES UA: NEGATIVE
NITRITE UA: NEGATIVE
PROTEIN UA: NEGATIVE
Spec Grav, UA: 1.015 (ref 1.010–1.025)
Urobilinogen, UA: 0.2 E.U./dL
pH, UA: 8 (ref 5.0–8.0)

## 2017-02-12 LAB — CBC WITH DIFFERENTIAL/PLATELET
BASOS PCT: 0.8 % (ref 0.0–3.0)
Basophils Absolute: 0 10*3/uL (ref 0.0–0.1)
EOS PCT: 3.4 % (ref 0.0–5.0)
Eosinophils Absolute: 0.2 10*3/uL (ref 0.0–0.7)
HEMATOCRIT: 37.2 % — AB (ref 39.0–52.0)
Hemoglobin: 12.5 g/dL — ABNORMAL LOW (ref 13.0–17.0)
LYMPHS ABS: 1 10*3/uL (ref 0.7–4.0)
LYMPHS PCT: 20.2 % (ref 12.0–46.0)
MCHC: 33.5 g/dL (ref 30.0–36.0)
MCV: 89.2 fl (ref 78.0–100.0)
MONOS PCT: 13 % — AB (ref 3.0–12.0)
Monocytes Absolute: 0.6 10*3/uL (ref 0.1–1.0)
NEUTROS ABS: 3 10*3/uL (ref 1.4–7.7)
Neutrophils Relative %: 62.6 % (ref 43.0–77.0)
PLATELETS: 222 10*3/uL (ref 150.0–400.0)
RBC: 4.17 Mil/uL — ABNORMAL LOW (ref 4.22–5.81)
RDW: 15.3 % (ref 11.5–15.5)
WBC: 4.8 10*3/uL (ref 4.0–10.5)

## 2017-02-12 LAB — COMPREHENSIVE METABOLIC PANEL
ALT: 86 U/L — ABNORMAL HIGH (ref 0–53)
AST: 44 U/L — ABNORMAL HIGH (ref 0–37)
Albumin: 4.4 g/dL (ref 3.5–5.2)
Alkaline Phosphatase: 132 U/L — ABNORMAL HIGH (ref 39–117)
BUN: 15 mg/dL (ref 6–23)
CHLORIDE: 100 meq/L (ref 96–112)
CO2: 32 meq/L (ref 19–32)
Calcium: 9.5 mg/dL (ref 8.4–10.5)
Creatinine, Ser: 0.9 mg/dL (ref 0.40–1.50)
GFR: 86.37 mL/min (ref >60.00)
GLUCOSE: 165 mg/dL — AB (ref 70–99)
POTASSIUM: 4.5 meq/L (ref 3.5–5.1)
SODIUM: 138 meq/L (ref 135–145)
Total Bilirubin: 0.4 mg/dL (ref 0.2–1.2)
Total Protein: 6.9 g/dL (ref 6.0–8.3)

## 2017-02-12 LAB — PROTIME-INR
INR: 1.1 ratio — ABNORMAL HIGH (ref 0.8–1.0)
Prothrombin Time: 11.9 s (ref 9.6–13.1)

## 2017-02-12 LAB — HEMOGLOBIN A1C: Hgb A1c MFr Bld: 7.7 % — ABNORMAL HIGH (ref 4.6–6.5)

## 2017-02-12 LAB — VITAMIN B12: VITAMIN B 12: 616 pg/mL (ref 211–911)

## 2017-02-12 NOTE — Assessment & Plan Note (Signed)
Chronic, stable on metformin 500mg  bid. Update labs today. Foot exam today.

## 2017-02-12 NOTE — Assessment & Plan Note (Signed)
Recently exacerbated by grief of losing last living sibling. He has returned to drinking alcohol. Support provided today.

## 2017-02-12 NOTE — Addendum Note (Signed)
Addended by: Clarnce Flock E on: 02/12/2017 12:27 PM   Modules accepted: Orders

## 2017-02-12 NOTE — Patient Instructions (Addendum)
Schedule eye exam as you're due Labs today  Urinalysis today.  I'm sorry to hear about your sister.  I do want you to work on decreased alcohol again.  Look into counseling through hospice.  Return in 3 months for follow up visit.

## 2017-02-12 NOTE — Assessment & Plan Note (Signed)
Has restarted drinking after sister's recent death, no longer attending AA. Encouraged full cessation again, encouraged restarting AA. Support provided.

## 2017-02-12 NOTE — Assessment & Plan Note (Signed)
Update labs. Continue to encourage alcohol cessation.

## 2017-02-12 NOTE — Assessment & Plan Note (Signed)
Chronic, stable. Continue current regimen. 

## 2017-02-12 NOTE — Progress Notes (Signed)
BP 128/76   Pulse (!) 56   Temp 98.1 F (36.7 C)   Ht _0  (1.702 m)   Wt 137 lb (62.1 kg)   SpO2 97%   BMI 21.46 kg/m    CC: f/u visit Subjective:    Patient ID: Justin Benson, male    DOB: Nov 13, 1934, 82 y.o.   MRN: [id number]  HPI: Justin Benson is a 82 y.o. male presenting on 02/12/2017 for Follow-up   Rough last few months. Sister - final living sibling - developed bladder cancer with met to lungs. Hospice was involved. She passed away 12/27/2022 this year. He has restarted drinking regularly - 4+ beers in evenings. Hasn't been to AA recently.   Bitten by lone star tick 2016-12-26. Denies fevers, new rashes, joint pains, abd pain.   Alcohol hepatitis - previous alcohol cessation. Has seen AA.   DM - regularly does not check sugars, only once weekly - 120. Compliant with antihyperglycemic regimen which includes: metformin 587m bid. Denies low sugars or hypoglycemic symptoms. Denies paresthesias. Last diabetic eye exam DUE.  Pneumovax: 2004.  Prevnar: 2015. Lab Results  Component Value Date   HGBA1C 6.7 (H) 06/10/2016   Diabetic Foot Exam - Simple   Simple Foot Form Diabetic Foot exam was performed with the following findings:  Yes 02/12/2017 10:02 AM  Visual Inspection No deformities, no ulcerations, no other skin breakdown bilaterally:  Yes Sensation Testing Intact to touch and monofilament testing bilaterally:  Yes Pulse Check Posterior Tibialis and Dorsalis pulse intact bilaterally:  Yes Comments      Relevant past medical, surgical, family and social history reviewed and updated as indicated. Interim medical history since our last visit reviewed. Allergies and medications reviewed and updated. Outpatient Medications Prior to Visit  Medication Sig Dispense Refill  . amLODipine (NORVASC) 10 MG tablet TAKE 1 TABLET BY MOUTH  DAILY BEFORE BREAKFAST 90 tablet 3  . aspirin 81 MG tablet Take 81 mg by mouth daily.      .Marland Kitchenatorvastatin (LIPITOR) 10 MG tablet TAKE ONE-HALF  TABLET BY  MOUTH EVERY EVENING 45 tablet 3  . finasteride (PROSCAR) 5 MG tablet TAKE 1 TABLET BY MOUTH  EVERY EVENING 90 tablet 3  . metFORMIN (GLUCOPHAGE) 500 MG tablet TAKE 1 TABLET BY MOUTH TWO  TIMES DAILY WITH MEALS 180 tablet 3  . Multiple Vitamin (MULTIVITAMIN WITH MINERALS) TABS Take 1 tablet by mouth daily.    . quinapril (ACCUPRIL) 20 MG tablet TAKE 1 TABLET BY MOUTH AT  BEDTIME 90 tablet 3  . quinapril-hydrochlorothiazide (ACCURETIC) 20-25 MG tablet TAKE 1 TABLET BY MOUTH  DAILY 90 tablet 3  . sertraline (ZOLOFT) 100 MG tablet Take 1 tablet (100 mg total) by mouth daily. 90 tablet 3  . amLODipine (NORVASC) 10 MG tablet Take 1 tablet (10 mg total) by mouth daily before breakfast. 30 tablet 1  . finasteride (PROSCAR) 5 MG tablet Take 1 tablet (5 mg total) by mouth every evening. 30 tablet 1  . quinapril (ACCUPRIL) 20 MG tablet Take 1 tablet (20 mg total) by mouth at bedtime. 30 tablet 1  . quinapril-hydrochlorothiazide (ACCURETIC) 20-25 MG tablet Take 1 tablet by mouth daily. 30 tablet 1   No facility-administered medications prior to visit.      Per HPI unless specifically indicated in ROS section below Review of Systems     Objective:    BP 128/76   Pulse (!) 56   Temp 98.1 F (36.7 C)   Ht 5'  7" (1.702 m)   Wt 137 lb (62.1 kg)   SpO2 97%   BMI 21.46 kg/m   Wt Readings from Last 3 Encounters:  02/12/17 137 lb (62.1 kg)  06/24/16 137 lb (62.1 kg)  06/10/16 136 lb 8 oz (61.9 kg)    Physical Exam  Constitutional: He appears well-developed and well-nourished. No distress.  HENT:  Head: Normocephalic and atraumatic.  Right Ear: External ear normal.  Left Ear: External ear normal.  Nose: Nose normal.  Mouth/Throat: Oropharynx is clear and moist. No oropharyngeal exudate.  Eyes: Pupils are equal, round, and reactive to light. Conjunctivae and EOM are normal. No scleral icterus.  Neck: Normal range of motion. Neck supple.  Cardiovascular: Normal rate, regular rhythm,  normal heart sounds and intact distal pulses.   No murmur heard. Pulmonary/Chest: Effort normal and breath sounds normal. No respiratory distress. He has no wheezes. He has no rales.  Musculoskeletal: He exhibits no edema.  See HPI for foot exam if done  Lymphadenopathy:    He has no cervical adenopathy.  Skin: Skin is warm and dry. No rash noted.  Psychiatric: He has a normal mood and affect.  Nursing note and vitals reviewed.  Results for orders placed or performed in visit on 06/10/16  PSA, Medicare  Result Value Ref Range   PSA 0.36 0.10 - 4.00 ng/ml  Lipid panel  Result Value Ref Range   Cholesterol 143 0 - 200 mg/dL   Triglycerides 109.0 0.0 - 149.0 mg/dL   HDL 60.60 >39.00 mg/dL   VLDL 21.8 0.0 - 40.0 mg/dL   LDL Cholesterol 60 0 - 99 mg/dL   Total CHOL/HDL Ratio 2    NonHDL 82.29   Comprehensive metabolic panel  Result Value Ref Range   Sodium 138 135 - 145 mEq/L   Potassium 4.6 3.5 - 5.1 mEq/L   Chloride 99 96 - 112 mEq/L   CO2 32 19 - 32 mEq/L   Glucose, Bld 118 (H) 70 - 99 mg/dL   BUN 15 6 - 23 mg/dL   Creatinine, Ser 0.79 0.40 - 1.50 mg/dL   Total Bilirubin 0.5 0.2 - 1.2 mg/dL   Alkaline Phosphatase 125 (H) 39 - 117 U/L   AST 48 (H) 0 - 37 U/L   ALT 93 (H) 0 - 53 U/L   Total Protein 7.0 6.0 - 8.3 g/dL   Albumin 4.7 3.5 - 5.2 g/dL   Calcium 9.8 8.4 - 10.5 mg/dL   GFR 100.56 >60.00 mL/min  Hemoglobin A1c  Result Value Ref Range   Hgb A1c MFr Bld 6.7 (H) 4.6 - 6.5 %  CBC with Differential/Platelet  Result Value Ref Range   WBC 6.0 4.0 - 10.5 K/uL   RBC 4.55 4.22 - 5.81 Mil/uL   Hemoglobin 13.9 13.0 - 17.0 g/dL   HCT 41.2 39.0 - 52.0 %   MCV 90.6 78.0 - 100.0 fl   MCHC 33.6 30.0 - 36.0 g/dL   RDW 14.4 11.5 - 15.5 %   Platelets 205.0 150.0 - 400.0 K/uL   Neutrophils Relative % 63.9 43.0 - 77.0 %   Lymphocytes Relative 22.2 12.0 - 46.0 %   Monocytes Relative 11.6 3.0 - 12.0 %   Eosinophils Relative 1.7 0.0 - 5.0 %   Basophils Relative 0.6 0.0 - 3.0 %    Neutro Abs 3.8 1.4 - 7.7 K/uL   Lymphs Abs 1.3 0.7 - 4.0 K/uL   Monocytes Absolute 0.7 0.1 - 1.0 K/uL   Eosinophils Absolute 0.1  0.0 - 0.7 K/uL   Basophils Absolute 0.0 0.0 - 0.1 K/uL      Assessment & Plan:   Problem List Items Addressed This Visit    Alcohol abuse    Has restarted drinking after sister's recent death, no longer attending AA. Encouraged full cessation again, encouraged restarting AA. Support provided.       Relevant Orders   Vitamin W86   Alcoholic hepatitis without ascites    Update labs. Continue to encourage alcohol cessation.       Relevant Orders   Comprehensive metabolic panel   CBC with Differential/Platelet   Protime-INR   Vitamin B12   Diabetes type 2, controlled ([place]) - Primary    Chronic, stable on metformin 565m bid. Update labs today. Foot exam today.       Relevant Orders   Hemoglobin A1c   Essential hypertension    Chronic, stable. Continue current regimen.       MDD (major depressive disorder), recurrent episode, moderate (HCC)    Recently exacerbated by grief of losing last living sibling. He has returned to drinking alcohol. Support provided today.           Follow up plan: Return in about 3 months (around 05/15/2017), or if symptoms worsen or fail to improve, for follow up visit.  JRia Bush MD

## 2017-02-13 ENCOUNTER — Encounter: Payer: Self-pay | Admitting: *Deleted

## 2017-04-26 ENCOUNTER — Other Ambulatory Visit: Payer: Self-pay | Admitting: Family Medicine

## 2017-05-15 ENCOUNTER — Ambulatory Visit: Payer: Medicare Other | Admitting: Family Medicine

## 2017-05-16 ENCOUNTER — Ambulatory Visit (INDEPENDENT_AMBULATORY_CARE_PROVIDER_SITE_OTHER): Payer: Medicare Other | Admitting: Family Medicine

## 2017-05-16 ENCOUNTER — Encounter: Payer: Self-pay | Admitting: Family Medicine

## 2017-05-16 VITALS — BP 122/60 | HR 61 | Temp 97.6°F | Wt 129.8 lb

## 2017-05-16 DIAGNOSIS — F1010 Alcohol abuse, uncomplicated: Secondary | ICD-10-CM | POA: Diagnosis not present

## 2017-05-16 DIAGNOSIS — K7010 Alcoholic hepatitis without ascites: Secondary | ICD-10-CM

## 2017-05-16 DIAGNOSIS — E1169 Type 2 diabetes mellitus with other specified complication: Secondary | ICD-10-CM | POA: Diagnosis not present

## 2017-05-16 DIAGNOSIS — R634 Abnormal weight loss: Secondary | ICD-10-CM | POA: Diagnosis not present

## 2017-05-16 DIAGNOSIS — F331 Major depressive disorder, recurrent, moderate: Secondary | ICD-10-CM | POA: Diagnosis not present

## 2017-05-16 LAB — LIPASE: LIPASE: 82 U/L — AB (ref 11.0–59.0)

## 2017-05-16 LAB — COMPREHENSIVE METABOLIC PANEL
ALT: 159 U/L — ABNORMAL HIGH (ref 0–53)
AST: 122 U/L — ABNORMAL HIGH (ref 0–37)
Albumin: 4.6 g/dL (ref 3.5–5.2)
Alkaline Phosphatase: 178 U/L — ABNORMAL HIGH (ref 39–117)
BUN: 16 mg/dL (ref 6–23)
CHLORIDE: 97 meq/L (ref 96–112)
CO2: 33 meq/L — AB (ref 19–32)
Calcium: 9.8 mg/dL (ref 8.4–10.5)
Creatinine, Ser: 0.76 mg/dL (ref 0.40–1.50)
GFR: 104.91 mL/min (ref >60.00)
GLUCOSE: 136 mg/dL — AB (ref 70–99)
Potassium: 4.5 mEq/L (ref 3.5–5.1)
SODIUM: 138 meq/L (ref 135–145)
TOTAL PROTEIN: 7.2 g/dL (ref 6.0–8.3)
Total Bilirubin: 0.6 mg/dL (ref 0.2–1.2)

## 2017-05-16 LAB — CBC WITH DIFFERENTIAL/PLATELET
Basophils Absolute: 0 10*3/uL (ref 0.0–0.1)
Basophils Relative: 0.7 % (ref 0.0–3.0)
EOS ABS: 0.1 10*3/uL (ref 0.0–0.7)
Eosinophils Relative: 1.7 % (ref 0.0–5.0)
HCT: 36 % — ABNORMAL LOW (ref 39.0–52.0)
HEMOGLOBIN: 11.8 g/dL — AB (ref 13.0–17.0)
Lymphocytes Relative: 20.6 % (ref 12.0–46.0)
Lymphs Abs: 0.8 10*3/uL (ref 0.7–4.0)
MCHC: 32.7 g/dL (ref 30.0–36.0)
MCV: 87.7 fl (ref 78.0–100.0)
MONO ABS: 0.4 10*3/uL (ref 0.1–1.0)
Monocytes Relative: 11.2 % (ref 3.0–12.0)
Neutro Abs: 2.6 10*3/uL (ref 1.4–7.7)
Neutrophils Relative %: 65.8 % (ref 43.0–77.0)
Platelets: 220 10*3/uL (ref 150.0–400.0)
RBC: 4.1 Mil/uL — AB (ref 4.22–5.81)
RDW: 15.3 % (ref 11.5–15.5)
WBC: 3.9 10*3/uL — ABNORMAL LOW (ref 4.0–10.5)

## 2017-05-16 LAB — HEMOGLOBIN A1C: HEMOGLOBIN A1C: 6.7 % — AB (ref 4.6–6.5)

## 2017-05-16 MED ORDER — SERTRALINE HCL 100 MG PO TABS: 100.0000 mg | ORAL_TABLET | Freq: Every day | ORAL | 3 refills | Status: DC

## 2017-05-16 MED ORDER — QUINAPRIL-HYDROCHLOROTHIAZIDE 20-25 MG PO TABS: 1.0000 | ORAL_TABLET | Freq: Every day | ORAL | 3 refills | Status: DC

## 2017-05-16 MED ORDER — QUINAPRIL HCL 20 MG PO TABS: 20.0000 mg | ORAL_TABLET | Freq: Every day | ORAL | 3 refills | Status: DC

## 2017-05-16 MED ORDER — METFORMIN HCL 500 MG PO TABS
ORAL_TABLET | ORAL | 3 refills | Status: DC
Start: 2017-05-16 — End: 2018-05-30

## 2017-05-16 MED ORDER — ATORVASTATIN CALCIUM 10 MG PO TABS: 5.0000 mg | ORAL_TABLET | Freq: Every day | ORAL | 3 refills | Status: DC

## 2017-05-16 MED ORDER — FINASTERIDE 5 MG PO TABS: 5.0000 mg | ORAL_TABLET | Freq: Every evening | ORAL | 3 refills | Status: DC

## 2017-05-16 MED ORDER — AMLODIPINE BESYLATE 10 MG PO TABS: 10.0000 mg | ORAL_TABLET | Freq: Every day | ORAL | 3 refills | Status: DC

## 2017-05-16 NOTE — Assessment & Plan Note (Signed)
Chronic, stable on metformin. Continue. Update A1c today.

## 2017-05-16 NOTE — Progress Notes (Signed)
BP 122/60 (BP Location: Left Arm, Patient Position: Sitting, Cuff Size: Normal)   Pulse 61   Temp 97.6 F (36.4 C) (Oral)   Wt 129 lb 12 oz (58.9 kg)   SpO2 99%   BMI 20.32 kg/m    CC: 3 mo f/u visit Subjective:    Patient ID: Justin Benson, male    DOB: Jan 15, 1935, 82 y.o.   MRN: [id number]  HPI: Justin Benson is a 82 y.o. male presenting on 05/16/2017 for 3 mo follow-up   See prior note for details. Last visit he had restarted drinking after final sibling's death. He has been to hospice individual therapy which has been beneficial. He restarted AA and has a sponsor. He is doing much better regarding his drinking. Tolerating sertraline 50mg  daily well. Denies SI/HI.   DM - sugars are trending down 100-120, checks every morning fasting. Compliant with metformin 500mg  bid. No hypoglycemia, no paresthesias. Pneumovax 2004, prevnar 2015. Eye exam 12/2016 Lab Results  Component Value Date   HGBA1C 7.7 (H) 02/12/2017     Increased bowel changes over last few weeks despite citrucel and miralax. Denies dietary changes. Weight loss noted. Endorses some early satiety. No dysphagia.  Colonoscopy UTD 2015  Relevant past medical, surgical, family and social history reviewed and updated as indicated. Interim medical history since our last visit reviewed. Allergies and medications reviewed and updated. Outpatient Medications Prior to Visit  Medication Sig Dispense Refill  . aspirin 81 MG tablet Take 81 mg by mouth daily.      . Multiple Vitamin (MULTIVITAMIN WITH MINERALS) TABS Take 1 tablet by mouth daily.    Marland Kitchen omeprazole (PRILOSEC) 40 MG capsule Take 40 mg by mouth daily.    Marland Kitchen amLODipine (NORVASC) 10 MG tablet TAKE 1 TABLET BY MOUTH  DAILY BEFORE BREAKFAST 90 tablet 0  . atorvastatin (LIPITOR) 10 MG tablet TAKE ONE-HALF TABLET BY  MOUTH EVERY EVENING 45 tablet 0  . finasteride (PROSCAR) 5 MG tablet TAKE 1 TABLET BY MOUTH  EVERY EVENING 90 tablet 0  . metFORMIN (GLUCOPHAGE) 500 MG tablet  TAKE 1 TABLET BY MOUTH TWO  TIMES DAILY WITH MEALS 180 tablet 0  . quinapril (ACCUPRIL) 20 MG tablet TAKE 1 TABLET BY MOUTH AT  BEDTIME 90 tablet 3  . quinapril-hydrochlorothiazide (ACCURETIC) 20-25 MG tablet TAKE 1 TABLET BY MOUTH  DAILY 90 tablet 3  . sertraline (ZOLOFT) 100 MG tablet TAKE 1 TABLET BY MOUTH  DAILY 90 tablet 0   No facility-administered medications prior to visit.      Per HPI unless specifically indicated in ROS section below Review of Systems     Objective:    BP 122/60 (BP Location: Left Arm, Patient Position: Sitting, Cuff Size: Normal)   Pulse 61   Temp 97.6 F (36.4 C) (Oral)   Wt 129 lb 12 oz (58.9 kg)   SpO2 99%   BMI 20.32 kg/m   Wt Readings from Last 3 Encounters:  05/16/17 129 lb 12 oz (58.9 kg)  02/12/17 137 lb (62.1 kg)  06/24/16 137 lb (62.1 kg)    Physical Exam  Constitutional: He appears well-developed and well-nourished. No distress.  HENT:  Mouth/Throat: Oropharynx is clear and moist. No oropharyngeal exudate.  Eyes: Pupils are equal, round, and reactive to light. Conjunctivae are normal.  Cardiovascular: Normal rate, regular rhythm, normal heart sounds and intact distal pulses.   No murmur heard. Pulmonary/Chest: Effort normal and breath sounds normal. No respiratory distress. He has no wheezes. He  has no rales.  Abdominal: Soft. Normal appearance and bowel sounds are normal. He exhibits no distension and no mass. There is no hepatosplenomegaly. There is no tenderness. There is no rigidity, no rebound, no guarding and negative Murphy's sign.  Musculoskeletal: He exhibits no edema.  Skin: Skin is warm and dry. No rash noted.  Psychiatric: He has a normal mood and affect.  Nursing note and vitals reviewed.  Results for orders placed or performed in visit on 05/16/17  HM DIABETES EYE EXAM  Result Value Ref Range   HM Diabetic Eye Exam No Retinopathy No Retinopathy      Assessment & Plan:   Problem List Items Addressed This Visit     Alcohol abuse    He has restarted AA this summer. He has sponsor.       Alcoholic hepatitis without ascites    Update abd [place]. No significant hepatomegaly on exam today. With endorsed early satiety and weight loss, low threshold to further image pending results of abd [place].       Relevant Orders   Comprehensive metabolic panel   CBC with Differential/Platelet   Lipase   US Abdomen Complete   Diabetes type 2, controlled ([place]) - Primary    Chronic, stable on metformin. Continue. Update A1c today.       Relevant Medications   quinapril-hydrochlorothiazide (ACCURETIC) 20-25 MG tablet   quinapril (ACCUPRIL) 20 MG tablet   metFORMIN (GLUCOPHAGE) 500 MG tablet   atorvastatin (LIPITOR) 10 MG tablet   Other Relevant Orders   Hemoglobin A1c   MDD (major depressive disorder), recurrent episode, moderate ([facility])    Stable period on sertraline 100mg  daily, he has also seen hospice counselor. Encouraged complete alcohol cessation.       Relevant Medications   sertraline (ZOLOFT) 100 MG tablet   Weight loss    8 lb weight loss over last 3 months - ?grieving related. Check labs, abd [place] today.           Follow up plan: Return in about 3 months (around 08/16/2017) for annual exam, prior fasting for blood work.  [name], MD

## 2017-05-16 NOTE — Patient Instructions (Addendum)
We will schedule you for abdominal ultrasound - see Bonnita Nasuti on your way out [facility] today.  Return in 3 months for medicare wellness visit with Katha Cabal and physical with me.

## 2017-05-16 NOTE — Assessment & Plan Note (Signed)
8 lb weight loss over last 3 months - ?grieving related. Check labs, abd [place] today.

## 2017-05-16 NOTE — Assessment & Plan Note (Signed)
Update abd [place]. No significant hepatomegaly on exam today. With endorsed early satiety and weight loss, low threshold to further image pending results of abd [place].

## 2017-05-16 NOTE — Assessment & Plan Note (Signed)
Stable period on sertraline 100mg  daily, he has also seen hospice counselor. Encouraged complete alcohol cessation.

## 2017-05-16 NOTE — Assessment & Plan Note (Signed)
He has restarted AA this summer. He has sponsor.

## 2017-05-20 ENCOUNTER — Ambulatory Visit
Admission: RE | Admit: 2017-05-20 | Discharge: 2017-05-20 | Disposition: A | Discharge status: Home / Self Care | Payer: Medicare Other | Source: Ambulatory Visit | Attending: Family Medicine | Admitting: Family Medicine

## 2017-05-20 DIAGNOSIS — Z9049 Acquired absence of other specified parts of digestive tract: Secondary | ICD-10-CM | POA: Diagnosis not present

## 2017-05-20 DIAGNOSIS — K7010 Alcoholic hepatitis without ascites: Secondary | ICD-10-CM | POA: Insufficient documentation

## 2017-05-26 ENCOUNTER — Encounter: Payer: Self-pay | Admitting: Family Medicine

## 2017-05-26 DIAGNOSIS — N281 Cyst of kidney, acquired: Secondary | ICD-10-CM | POA: Insufficient documentation

## 2017-05-27 ENCOUNTER — Telehealth: Payer: Self-pay | Admitting: Family Medicine

## 2017-05-27 NOTE — Telephone Encounter (Signed)
Copied from [place] #[id number]. Topic: Quick Communication - See Telephone Encounter >> May 27, 2017 12:31 PM Bea Graff, NT wrote: CRM for notification. See Telephone encounter for:  05/27/17. Pt states office called yesterday and did not leave message. He believes it may have been for his [place] results. Please call pt

## 2017-05-27 NOTE — Telephone Encounter (Addendum)
Left message on vm per pt request with ultrasound results per Dr. Darnell Level.

## 2017-05-29 HISTORY — PX: BASAL CELL CARCINOMA EXCISION: SHX1214

## 2017-06-24 ENCOUNTER — Encounter (HOSPITAL_COMMUNITY): Payer: Self-pay | Admitting: Emergency Medicine

## 2017-06-24 ENCOUNTER — Ambulatory Visit (HOSPITAL_COMMUNITY)
Admission: EM | Admit: 2017-06-24 | Discharge: 2017-06-24 | Disposition: A | Discharge status: Home / Self Care | Payer: Medicare Other | Attending: Family Medicine | Admitting: Family Medicine

## 2017-06-24 ENCOUNTER — Ambulatory Visit: Payer: Self-pay

## 2017-06-24 DIAGNOSIS — L03012 Cellulitis of left finger: Secondary | ICD-10-CM

## 2017-06-24 MED ORDER — MUPIROCIN 2 % EX OINT: 1.0000 "application " | TOPICAL_OINTMENT | Freq: Three times a day (TID) | CUTANEOUS | 1 refills | Status: DC

## 2017-06-24 MED ORDER — DOXYCYCLINE HYCLATE 100 MG PO TABS: 100.0000 mg | ORAL_TABLET | Freq: Two times a day (BID) | ORAL | 0 refills | Status: DC

## 2017-06-24 NOTE — ED Provider Notes (Signed)
[facility]   [id number] 06/24/17 Arrival Time: 1350   SUBJECTIVE:  Justin Benson is a 82 y.o. male who presents to the urgent care with complaint of left elbow swelling that started 2-3 days ago. PT woke up with swelling that extended down left forearm. No injury to area. No history of gout.   Patient had a skin cancer removed about 9 days ago and the wound on the rectus left hand is not healed well and is been getting worse     Past Medical History:  Diagnosis Date  . AAA (abdominal aortic aneurysm) ([place]) 05/2012   mild 2.3 cm , rpt 1 year  . Alcoholic hepatitis without ascites 06/02/2014   Normal viral hep panel, normal abd [place] (2015)  . Arthritis    fingers and hip  . BPH (benign prostatic hypertrophy)   . Choledocholithiasis 11/2012   s/p ERCP, s/p lap chole  . COPD (chronic obstructive pulmonary disease) ([place])   . Depression   . Diabetes mellitus 4/02  . GERD (gastroesophageal reflux disease)   . Hyperlipidemia   . Hypertension 1996  . Pneumonia as a baby  . 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 Schwannoma of spinal cord ([place]) 11/2012, 09/2015   medial to R psoas muscle, 4cm size  . 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 Prostate cancer Father   . Aneurysm Father        aortic  . Stroke Father   . Cancer Sister        bladder (nonsmoker)  . Hodgkin's lymphoma Brother 51  . Prostate cancer Brother 63       1/2  . Prostate cancer Brother 76       1/2  . Other Sister        cerebral hemm  . Lupus Sister   . Diabetes Brother   . Colon cancer Neg Hx    Social History   Socioeconomic History  . Marital status: Single    Spouse name: Not on file  . Number of children: 1  . Years of education: Not on file  . Highest education level: Not on file  Social Needs  . Financial resource strain: Not on file  . Food insecurity - worry: Not on file  . Food insecurity - inability: Not on  file  . Transportation needs - medical: Not on file  . Transportation needs - non-medical: Not on file  Occupational History  . Occupation: Chartered certified accountant: retired    Comment: retired 1999  Tobacco Use  . Smoking status: Former Smoker    Packs/day: 2.00    Years: 30.00    Pack years: 60.00    Types: Cigarettes    Start date: 07/29/1962    Last attempt to quit: 07/29/1992    Years since quitting: 24.9  . Smokeless tobacco: Never Used  Substance and Sexual Activity  . Alcohol use: Yes    Alcohol/week: 7.2 oz    Types: 12 Cans of beer per week    Comment: rare---recently stopped drinking as much  . Drug use: No  . Sexual activity: Yes  Other Topics Concern  . 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   Current Meds  Medication Sig  . amLODipine (NORVASC) 10 MG tablet Take 1 tablet (10 mg total) by mouth daily.  Marland Kitchen aspirin 81 MG tablet Take 81 mg by mouth daily.    Marland Kitchen atorvastatin (LIPITOR) 10 MG tablet Take 0.5 tablets (5 mg total) by mouth daily at 6 PM.  . finasteride (PROSCAR) 5 MG tablet Take 1 tablet (5 mg total) by mouth every evening.  . metFORMIN (GLUCOPHAGE) 500 MG tablet TAKE 1 TABLET BY MOUTH TWO  TIMES DAILY WITH MEALS  . Multiple Vitamin (MULTIVITAMIN WITH MINERALS) TABS Take 1 tablet by mouth daily.  Marland Kitchen omeprazole (PRILOSEC) 40 MG capsule Take 40 mg by mouth daily.  . quinapril (ACCUPRIL) 20 MG tablet Take 1 tablet (20 mg total) by mouth at bedtime.  . quinapril-hydrochlorothiazide (ACCURETIC) 20-25 MG tablet Take 1 tablet by mouth daily.  . sertraline (ZOLOFT) 100 MG tablet Take 1 tablet (100 mg total) by mouth daily.   No Known Allergies    ROS: As per HPI, remainder of ROS  negative.   OBJECTIVE:   Vitals:   06/24/17 1407 06/24/17 1408  BP:  (!) 157/76  Pulse:  66  Resp:  16  Temp:  (!) 97.2 F (36.2 C)  TempSrc:  Oral  SpO2:  97%  Weight: 138 lb (62.6 kg)   Height: 5\' 9"  (1.753 m)      General appearance: alert; no distress Eyes: PERRL; EOMI; conjunctiva normal HENT: normocephalic; atraumatic; TMs normal, canal normal, external ears normal without trauma; nasal mucosa normal; oral mucosa normal Neck: supple Back: no CVA tenderness Extremities: no cyanosis or edema; symmetrical with no gross deformities; Inflamed open wound on the back of the left hand with mild swelling and redness in the forearm as well as a 2 cm hard left epitrochlear node Skin: warm and dry Neurologic: normal gait; grossly normal Psychological: alert and cooperative; normal mood and affect      Labs:  Results for orders placed or performed in visit on 05/16/17  Comprehensive metabolic panel  Result Value Ref Range   Sodium 138 135 - 145 mEq/L   Potassium 4.5 3.5 - 5.1 mEq/L   Chloride 97 96 - 112 mEq/L   CO2 33 (H) 19 - 32 mEq/L   Glucose, Bld 136 (H) 70 - 99 mg/dL   BUN 16 6 - 23 mg/dL   Creatinine, Ser 0.76 0.40 - 1.50 mg/dL   Total Bilirubin 0.6 0.2 - 1.2 mg/dL   Alkaline Phosphatase 178 (H) 39 - 117 U/L   AST 122 (H) 0 - 37 U/L   ALT 159 (H) 0 - 53 U/L   Total Protein 7.2 6.0 - 8.3 g/dL   Albumin 4.6 3.5 - 5.2 g/dL   Calcium 9.8 8.4 - 10.5 mg/dL   GFR 104.91 >60.00 mL/min  Hemoglobin A1c  Result Value Ref Range   Hgb A1c MFr Bld 6.7 (H) 4.6 - 6.5 %  CBC with Differential/Platelet  Result Value Ref Range   WBC 3.9 (L) 4.0 - 10.5 K/uL   RBC 4.10 (L) 4.22 - 5.81 Mil/uL   Hemoglobin 11.8 (L) 13.0 - 17.0 g/dL   HCT 36.0 (L) 39.0 - 52.0 %   MCV 87.7 78.0 - 100.0 fl   MCHC 32.7 30.0 - 36.0 g/dL   RDW 15.3 11.5 - 15.5 %   Platelets 220.0 150.0 - 400.0 K/uL   Neutrophils Relative %  65.8 43.0 - 77.0 %   Lymphocytes Relative 20.6 12.0 - 46.0 %    Monocytes Relative 11.2 3.0 - 12.0 %   Eosinophils Relative 1.7 0.0 - 5.0 %   Basophils Relative 0.7 0.0 - 3.0 %   Neutro Abs 2.6 1.4 - 7.7 K/uL   Lymphs Abs 0.8 0.7 - 4.0 K/uL   Monocytes Absolute 0.4 0.1 - 1.0 K/uL   Eosinophils Absolute 0.1 0.0 - 0.7 K/uL   Basophils Absolute 0.0 0.0 - 0.1 K/uL  Lipase  Result Value Ref Range   Lipase 82.0 (H) 11.0 - 59.0 U/L  HM DIABETES EYE EXAM  Result Value Ref Range   HM Diabetic Eye Exam No Retinopathy No Retinopathy    Labs Reviewed - No data to display  No results found.     ASSESSMENT & PLAN:  1. Cellulitis of finger of left hand     Meds ordered this encounter  Medications  . doxycycline (VIBRA-TABS) 100 MG tablet    Sig: Take 1 tablet (100 mg total) by mouth 2 (two) times daily.    Dispense:  20 tablet    Refill:  0  . mupirocin ointment (BACTROBAN) 2 %    Sig: Apply 1 application topically 3 (three) times daily.    Dispense:  22 g    Refill:  1    Reviewed expectations re: course of current medical issues. Questions answered. Outlined signs and symptoms indicating need for more acute intervention. Patient verbalized understanding. After Visit Summary given.    Procedures:      Robyn Haber, MD 06/24/17 1427

## 2017-06-24 NOTE — ED Triage Notes (Signed)
PT has left elbow swelling that started 2-3 days ago. PT woke up with swelling that extended down left forearm. No injury to area. No history of gout.

## 2017-06-24 NOTE — Telephone Encounter (Signed)
Rec'd call from pt. with c/o approx. 2-3 day hx of left arm and elbow being sore and swollen.  Reported from the bend of (L) elbow to the left hand is swollen.  Reported has a lump "approx. size of half dollar" on inner aspect, just above the bend of (L) elbow. Denied any break in skin , rash, or sign of bug or spider bite.  Reported the lump has a "rosey red color" but is not warm to touch.  Stated the arm is not painful if hanging down to side, but the discomfort in the lump increases with bending the left elbow.  Denied fever or chills.  Reported he had a skin cancer removed from the left hand approx. one week ago, and that area looks fine.  Per protocol, pt. Should be seen within 24 hrs.  No appts. Available at [facility] or [facility] today or tomorrow.  Pt. Stated he will go to [facility] for eval. Today.  Care advice given per protocol.      Reason for Disposition . Looks like a boil, infected sore, deep ulcer or other infected rash (spreading redness, pus)  Answer Assessment - Initial Assessment Questions 1. ONSET: "When did the pain start?"     Left arm sore and swollen 2-3 days ago 2. LOCATION: "Where is the pain located?"     Inner aspect of left arm at antecubital space; c/o swelling from bend of elbow and to the fingers.    3. PAIN: "How bad is the pain?" (Scale 1-10; or mild, moderate, severe)   - MILD (1-3): doesn't interfere with normal activities   - MODERATE (4-7): interferes with normal activities (e.g., work or school) or awakens from sleep   - SEVERE (8-10): excruciating pain, unable to do any normal activities, unable to hold a cup of water     Mild @ 1/10; with arm down by side; increases to 3/10 with bending elbow 4. WORK OR EXERCISE: "Has there been any recent work or exercise that involved this part of the body?"     No; does a lot of yard work  5. CAUSE: "What do you think is causing the arm pain?"     unknown 6. OTHER SYMPTOMS: "Do you have any other symptoms?" (e.g.,  neck pain, swelling, rash, fever, numbness, weakness)    C/o half dollar sized lump that is tender; some rosy red color; no signs of bug bite.; no fever/ chills; no rash 7. PREGNANCY: "Is there any chance you are pregnant?" "When was your last menstrual period?"     n/a  Protocols used: ARM PAIN-A-AH

## 2017-06-24 NOTE — ED Notes (Signed)
Applied Bacitracin to the wound on the left hand, placed a nonadherent bandage over the wound and wrapped the hand in [place]. Sent the pt home with the remaining dressings.

## 2017-06-24 NOTE — Discharge Instructions (Addendum)
Wash the wound several times a day with soap and water.  You should see significant improvement over the next 4-5 days. If not, please return

## 2017-08-17 ENCOUNTER — Other Ambulatory Visit: Payer: Self-pay | Admitting: Family Medicine

## 2017-08-17 DIAGNOSIS — E785 Hyperlipidemia, unspecified: Secondary | ICD-10-CM

## 2017-08-17 DIAGNOSIS — R634 Abnormal weight loss: Secondary | ICD-10-CM

## 2017-08-17 DIAGNOSIS — E1169 Type 2 diabetes mellitus with other specified complication: Secondary | ICD-10-CM

## 2017-08-17 DIAGNOSIS — N400 Enlarged prostate without lower urinary tract symptoms: Secondary | ICD-10-CM

## 2017-08-20 ENCOUNTER — Ambulatory Visit (INDEPENDENT_AMBULATORY_CARE_PROVIDER_SITE_OTHER): Payer: Medicare Other

## 2017-08-20 VITALS — BP 126/70 | HR 63 | Temp 97.9°F | Ht 67.25 in | Wt 136.8 lb

## 2017-08-20 DIAGNOSIS — N400 Enlarged prostate without lower urinary tract symptoms: Secondary | ICD-10-CM | POA: Diagnosis not present

## 2017-08-20 DIAGNOSIS — E785 Hyperlipidemia, unspecified: Secondary | ICD-10-CM

## 2017-08-20 DIAGNOSIS — R634 Abnormal weight loss: Secondary | ICD-10-CM

## 2017-08-20 DIAGNOSIS — E1169 Type 2 diabetes mellitus with other specified complication: Secondary | ICD-10-CM | POA: Diagnosis not present

## 2017-08-20 DIAGNOSIS — Z0000 Encounter for general adult medical examination without abnormal findings: Secondary | ICD-10-CM | POA: Diagnosis not present

## 2017-08-20 LAB — COMPREHENSIVE METABOLIC PANEL
ALBUMIN: 4.2 g/dL (ref 3.5–5.2)
ALK PHOS: 114 U/L (ref 39–117)
ALT: 52 U/L (ref 0–53)
AST: 22 U/L (ref 0–37)
BUN: 16 mg/dL (ref 6–23)
CALCIUM: 9.1 mg/dL (ref 8.4–10.5)
CO2: 29 mEq/L (ref 19–32)
Chloride: 100 mEq/L (ref 96–112)
Creatinine, Ser: 0.79 mg/dL (ref 0.40–1.50)
GFR: 100.26 mL/min (ref >60.00)
Glucose, Bld: 150 mg/dL — ABNORMAL HIGH (ref 70–99)
POTASSIUM: 4.3 meq/L (ref 3.5–5.1)
SODIUM: 139 meq/L (ref 135–145)
TOTAL PROTEIN: 6.6 g/dL (ref 6.0–8.3)
Total Bilirubin: 0.3 mg/dL (ref 0.2–1.2)

## 2017-08-20 LAB — LIPID PANEL
CHOLESTEROL: 142 mg/dL (ref 0–200)
HDL: 67.2 mg/dL (ref >39.00)
LDL Cholesterol: 61 mg/dL (ref 0–99)
NonHDL: 74.48
Total CHOL/HDL Ratio: 2
Triglycerides: 67 mg/dL (ref 0.0–149.0)
VLDL: 13.4 mg/dL (ref 0.0–40.0)

## 2017-08-20 LAB — CBC WITH DIFFERENTIAL/PLATELET
BASOS ABS: 0 10*3/uL (ref 0.0–0.1)
BASOS PCT: 0.6 % (ref 0.0–3.0)
EOS PCT: 4 % (ref 0.0–5.0)
Eosinophils Absolute: 0.2 10*3/uL (ref 0.0–0.7)
HEMATOCRIT: 30.6 % — AB (ref 39.0–52.0)
Hemoglobin: 9.8 g/dL — ABNORMAL LOW (ref 13.0–17.0)
LYMPHS PCT: 23.9 % (ref 12.0–46.0)
Lymphs Abs: 1.1 10*3/uL (ref 0.7–4.0)
MCHC: 32.2 g/dL (ref 30.0–36.0)
MCV: 79.3 fl (ref 78.0–100.0)
MONOS PCT: 15.5 % — AB (ref 3.0–12.0)
Monocytes Absolute: 0.7 10*3/uL (ref 0.1–1.0)
NEUTROS ABS: 2.7 10*3/uL (ref 1.4–7.7)
Neutrophils Relative %: 56 % (ref 43.0–77.0)
PLATELETS: 238 10*3/uL (ref 150.0–400.0)
RBC: 3.86 Mil/uL — ABNORMAL LOW (ref 4.22–5.81)
RDW: 16.8 % — AB (ref 11.5–15.5)
WBC: 4.8 10*3/uL (ref 4.0–10.5)

## 2017-08-20 LAB — PSA: PSA: 0.56 ng/mL (ref 0.10–4.00)

## 2017-08-20 LAB — TSH: TSH: 5.66 u[IU]/mL — ABNORMAL HIGH (ref 0.35–4.50)

## 2017-08-20 LAB — HEMOGLOBIN A1C: Hgb A1c MFr Bld: 7.3 % — ABNORMAL HIGH (ref 4.6–6.5)

## 2017-08-20 NOTE — Patient Instructions (Addendum)
Justin Benson , Thank you for taking time to come for your Medicare Wellness Visit. I appreciate your ongoing commitment to your health goals. Please review the following plan we discussed and let me know if I can assist you in the future.   These are the goals we discussed: Goals    . Follow up with Primary Care Provider     Starting 08/20/2017, I will continue to take medications as prescribed and to keep appointments with PCP as scheduled.        This is a list of the screening recommended for you and due dates:  Health Maintenance  Topic Date Due  . DTaP/Tdap/Td vaccine (1 - Tdap) 08/29/2019*  . Hemoglobin A1C  11/14/2017  . Eye exam for diabetics  12/27/2017  . Complete foot exam   02/12/2018  . Tetanus Vaccine  08/29/2019  . Flu Shot  Completed  . Pneumonia vaccines  Completed  *Topic was postponed. The date shown is not the original due date.   Preventive Care for Adults  A healthy lifestyle and preventive care can promote health and wellness. Preventive health guidelines for adults include the following key practices.  . A routine yearly physical is a good way to check with your health care provider about your health and preventive screening. It is a chance to share any concerns and updates on your health and to receive a thorough exam.  . Visit your dentist for a routine exam and preventive care every 6 months. Brush your teeth twice a day and floss once a day. Good oral hygiene prevents tooth decay and gum disease.  . The frequency of eye exams is based on your age, health, family medical history, use  of contact lenses, and other factors. Follow your health care provider's recommendations for frequency of eye exams.  . Eat a healthy diet. Foods like vegetables, fruits, whole grains, low-fat dairy products, and lean protein foods contain the nutrients you need without too many calories. Decrease your intake of foods high in solid fats, added sugars, and salt. Eat the right  amount of calories for you. Get information about a proper diet from your health care provider, if necessary.  . Regular physical exercise is one of the most important things you can do for your health. Most adults should get at least 150 minutes of moderate-intensity exercise (any activity that increases your heart rate and causes you to sweat) each week. In addition, most adults need muscle-strengthening exercises on 2 or more days a week.  Silver Sneakers may be a benefit available to you. To determine eligibility, you may visit the website: www.silversneakers.com or contact program at 386-360-1127 Mon-Fri between 8AM-8PM.   . Maintain a healthy weight. The body mass index (BMI) is a screening tool to identify possible weight problems. It provides an estimate of body fat based on height and weight. Your health care provider can find your BMI and can help you achieve or maintain a healthy weight.   For adults 20 years and older: ? A BMI below 18.5 is considered underweight. ? A BMI of 18.5 to 24.9 is normal. ? A BMI of 25 to 29.9 is considered overweight. ? A BMI of 30 and above is considered obese.   . Maintain normal blood lipids and cholesterol levels by exercising and minimizing your intake of saturated fat. Eat a balanced diet with plenty of fruit and vegetables. Blood tests for lipids and cholesterol should begin at age 65 and be repeated every 5  years. If your lipid or cholesterol levels are high, you are over 50, or you are at high risk for heart disease, you may need your cholesterol levels checked more frequently. Ongoing high lipid and cholesterol levels should be treated with medicines if diet and exercise are not working.  . If you smoke, find out from your health care provider how to quit. If you do not use tobacco, please do not start.  . If you choose to drink alcohol, please do not consume more than 2 drinks per day. One drink is considered to be 12 ounces (355 mL) of beer, 5  ounces (148 mL) of wine, or 1.5 ounces (44 mL) of liquor.  . If you are 60-59 years old, ask your health care provider if you should take aspirin to prevent strokes.  . Use sunscreen. Apply sunscreen liberally and repeatedly throughout the day. You should seek shade when your shadow is shorter than you. Protect yourself by wearing long sleeves, pants, a wide-brimmed hat, and sunglasses year round, whenever you are outdoors.  . Once a month, do a whole body skin exam, using a mirror to look at the skin on your back. Tell your health care provider of new moles, moles that have irregular borders, moles that are larger than a pencil eraser, or moles that have changed in shape or color.

## 2017-08-20 NOTE — Progress Notes (Signed)
Pre visit review using our clinic review tool, if applicable. No additional management support is needed unless otherwise documented below in the visit note. 

## 2017-08-20 NOTE — Progress Notes (Signed)
PCP notes:   Health maintenance:  No gaps identified.   Abnormal screenings:   Depression score: 4 Hearing - failed  Hearing Screening   125Hz  250Hz  500Hz  1000Hz  2000Hz  3000Hz  4000Hz  6000Hz  8000Hz   Right ear:   0 0 40  0    Left ear:   0 0 0  0      Patient concerns:   None  Nurse concerns:  None  Next PCP appt:   08/28/17 @ 0930

## 2017-08-20 NOTE — Progress Notes (Signed)
Subjective:   Justin Benson is a 83 y.o. male who presents for Medicare Annual/Subsequent preventive examination.  Review of Systems:  N/A Cardiac Risk Factors include: advanced age (>40men, >52 women);male gender;dyslipidemia;diabetes mellitus;hypertension     Objective:    Vitals: BP 126/70 (BP Location: Left Arm, Patient Position: Sitting, Cuff Size: Normal)   Pulse 63   Temp 97.9 F (36.6 C) (Oral)   Ht 5' 7.25" (1.708 m) Comment: no shoes  Wt 136 lb 12 oz (62 kg)   SpO2 99%   BMI 21.26 kg/m   Body mass index is 21.26 kg/m.  Advanced Directives 08/20/2017 06/10/2016 02/28/2014 02/26/2013 01/21/2013 12/16/2012  Does Patient Have a Medical Advance Directive? Yes Yes Patient has advance directive, copy not in chart Patient has advance directive, copy not in chart Patient has advance directive, copy not in chart Patient has advance directive, copy not in chart  Type of Advance Directive [place];Living will [place];Living will - [place];Living will [place];Living will [place];Living will  Does patient want to make changes to medical advance directive? - No - Patient declined - - - -  Copy of [place] in Chart? Yes Yes - Copy requested from family - Copy requested from family  Pre-existing out of facility DNR order (yellow form or pink MOST form) - - - - No -    Tobacco Social History   Tobacco Use  Smoking Status Former Smoker  . Packs/day: 2.00  . Years: 30.00  . Pack years: 60.00  . Types: Cigarettes  . Start date: 07/29/1962  . Last attempt to quit: 07/29/1992  . Years since quitting: 25.0  Smokeless Tobacco Never Used     Counseling given: No   Clinical Intake:  Pre-visit preparation completed: Yes  Pain : No/denies pain Pain Score: 0-No pain     Nutritional Status: BMI of 19-24  Normal Nutritional Risks: None Diabetes: Yes CBG done?:  No Did pt. bring in CBG monitor from home?: No  How often do you need to have someone help you when you read instructions, pamphlets, or other written materials from your doctor or pharmacy?: 1 - Never  Interpreter Needed?: No  Comments: pt lives alone Information entered by :: LPinson, LPN  Past Medical History:  Diagnosis Date  . AAA (abdominal aortic aneurysm) ([facility]) 05/2012   mild 2.3 cm , rpt 1 year  . Alcoholic hepatitis without ascites 06/02/2014   Normal viral hep panel, normal abd [place] (2015)  . Arthritis    fingers and hip  . BPH (benign prostatic hypertrophy)   . Choledocholithiasis 11/2012   s/p ERCP, s/p lap chole  . COPD (chronic obstructive pulmonary disease) ([facility])   . Depression   . Diabetes mellitus 4/02  . GERD (gastroesophageal reflux disease)   . Hyperlipidemia   . Hypertension 1996  . Pneumonia as a baby  . 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 Schwannoma of spinal cord ([place]) 11/2012, 09/2015   medial to R psoas muscle, 4cm size  . 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 BASAL CELL CARCINOMA EXCISION Right 05/2017   right ear; Dr. Elvera Lennox, [facility]  . CHOLECYSTECTOMY N/A 03/08/2013   Procedure: LAPAROSCOPIC CHOLECYSTECTOMY WITH INTRAOPERATIVE CHOLANGIOGRAM;  Surgeon: Harl Bowie, MD;  Location: [facility];  Service: General;  Laterality: N/A;  . CIRCUMCISION  10/30/09   Dr Gaynelle Arabian  . COLONOSCOPY  11/2008   2 small polyps (1 tubular), diverticulosis, lipoma, int/ext hemorrhoids [name])  . COLONOSCOPY  02/2014   1 polyp, diverticulosis, no rpt needed [name])  . ENDOSCOPIC RETROGRADE CHOLANGIOPANCREATOGRAPHY (ERCP) WITH PROPOFOL N/A 12/24/2012   Procedure: ENDOSCOPIC RETROGRADE CHOLANGIOPANCREATOGRAPHY (ERCP) WITH PROPOFOL;  Surgeon: Milus Banister, MD;  Location: [facility];  Service: Endoscopy;  Laterality: N/A;  . LAPAROSCOPIC CHOLECYSTECTOMY  02/2013   Dr. Ninfa Linden    Family History  Problem Relation Age of Onset  . Prostate cancer Father   . Aneurysm Father        aortic  . Stroke Father   . Cancer Sister        bladder (nonsmoker)  . Hodgkin's lymphoma Brother 71  . Prostate cancer Brother 14       1/2  . Prostate cancer Brother 76       1/2  . Other Sister        cerebral hemm  . Lupus Sister   . Diabetes Brother   . Colon cancer Neg Hx    Social History   Socioeconomic History  . Marital status: Single    Spouse name: None  . Number of children: 1  . Years of education: None  . Highest education level: None  Social Needs  . Financial resource strain: None  . Food insecurity - worry: None  . Food insecurity - inability: None  . Transportation needs - medical: None  . Transportation needs - non-medical: None  Occupational History  . Occupation: Chartered certified accountant: retired    Comment: retired 1999  Tobacco Use  . Smoking status: Former Smoker    Packs/day: 2.00    Years: 30.00    Pack years: 60.00    Types: Cigarettes    Start date: 07/29/1962    Last attempt to quit: 07/29/1992    Years since quitting: 25.0  . Smokeless tobacco: Never Used  Substance and Sexual Activity  . Alcohol use: Yes    Alcohol/week: 7.2 oz    Types: 12 Cans of beer per week    Comment: rare---recently stopped drinking as much  . Drug use: No  . Sexual activity: Yes  Other Topics Concern  . 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    Outpatient Encounter Medications as of 08/20/2017  Medication Sig  . amLODipine (NORVASC) 10 MG tablet Take 1 tablet (10 mg total) by mouth daily.  Marland Kitchen aspirin 81 MG tablet Take 81 mg by mouth daily.    Marland Kitchen atorvastatin (LIPITOR) 10 MG tablet Take  0.5 tablets (5 mg total) by mouth daily at 6 PM.  . finasteride (PROSCAR) 5 MG tablet Take 1 tablet (5 mg total) by mouth every evening.  . metFORMIN (GLUCOPHAGE) 500 MG tablet TAKE 1 TABLET BY MOUTH TWO  TIMES DAILY WITH MEALS  . Multiple Vitamin (MULTIVITAMIN WITH MINERALS) TABS Take 1 tablet by mouth daily.  Marland Kitchen omeprazole (PRILOSEC) 40 MG capsule Take 40 mg by mouth daily.  . quinapril (ACCUPRIL) 20 MG tablet Take 1 tablet (20 mg total) by mouth at bedtime.  . quinapril-hydrochlorothiazide (ACCURETIC) 20-25 MG tablet Take 1 tablet by mouth daily.  Marland Kitchen  sertraline (ZOLOFT) 100 MG tablet Take 1 tablet (100 mg total) by mouth daily.  . [DISCONTINUED] doxycycline (VIBRA-TABS) 100 MG tablet Take 1 tablet (100 mg total) by mouth 2 (two) times daily.  . [DISCONTINUED] mupirocin ointment (BACTROBAN) 2 % Apply 1 application topically 3 (three) times daily.   No facility-administered encounter medications on file as of 08/20/2017.     Activities of Daily Living In your present state of health, do you have any difficulty performing the following activities: 08/20/2017  Hearing? N  Vision? N  Difficulty concentrating or making decisions? N  Walking or climbing stairs? N  Dressing or bathing? N  Doing errands, shopping? N  Preparing Food and eating ? N  Using the Toilet? N  In the past six months, have you accidently leaked urine? N  Do you have problems with loss of bowel control? N  Managing your Medications? N  Managing your Finances? N  Housekeeping or managing your Housekeeping? N  Some recent data might be hidden    Patient Care Team: [name], MD as PCP - General (Family Medicine) Melida Quitter, MD as Consulting Physician (Otolaryngology) Harriett Sine, MD as Consulting Physician (Dermatology)   Assessment:   This is a routine wellness examination for Rupert.   Hearing Screening   125Hz  250Hz  500Hz  1000Hz  2000Hz  3000Hz  4000Hz  6000Hz  8000Hz   Right ear:   0 0 40  0    Left  ear:   0 0 0  0    Vision Screening Comments: Last vision exam in June 2018    Exercise Activities and Dietary recommendations Current Exercise Habits: Home exercise routine, Exercise limited by: None identified  Goals    . Follow up with Primary Care Provider     Starting 08/20/2017, I will continue to take medications as prescribed and to keep appointments with PCP as scheduled.        Fall Risk Fall Risk  08/20/2017 06/10/2016 06/10/2016 06/07/2015 06/02/2014  Falls in the past year? No No No No No  Comment - - - - -  Number falls in past yr: - - - - -  Injury with Fall? - - - - -  Comment - - - - -  Risk for fall due to : - - - - -  Risk for fall due to: Comment - - - - -   Depression Screen PHQ 2/9 Scores 08/20/2017 06/10/2016 06/07/2015 06/02/2014  PHQ - 2 Score 2 2 2  0  PHQ- 9 Score 4 6 2  -    Cognitive Function MMSE - Mini Mental State Exam 08/20/2017 06/10/2016  Orientation to time 5 5  Orientation to Place 5 5  Registration 3 3  Attention/ Calculation 0 0  Recall 3 3  Language- name 2 objects 0 0  Language- repeat 1 1  Language- follow 3 step command 3 3  Language- read & follow direction 0 0  Write a sentence 0 0  Copy design 0 0  Total score 20 20     PLEASE NOTE: A Mini-Cog screen was completed. Maximum score is 20. A value of 0 denotes this part of [place] MMSE was not completed or the patient failed this part of the Mini-Cog screening.   Mini-Cog Screening Orientation to Time - Max 5 pts Orientation to Place - Max 5 pts Registration - Max 3 pts Recall - Max 3 pts Language Repeat - Max 1 pts Language Follow 3 Step Command - Max 3 pts     Immunization  History  Administered Date(s) Administered  . Influenza Split 05/08/2011, 05/29/2012  . Influenza Whole 04/28/2006, 06/17/2007, 05/15/2008, 05/07/2010  . Influenza,inj,Quad PF,6+ Mos 06/02/2014, 06/07/2015  . Influenza-Unspecified 05/17/2013, 04/15/2016, 03/29/2017  . Pneumococcal Conjugate-13  06/02/2014  . Pneumococcal Polysaccharide-23 06/29/2003  . Td 12/28/1998, 08/28/2009  . Zoster 04/03/2011     Screening Tests Health Maintenance  Topic Date Due  . DTaP/Tdap/Td (1 - Tdap) 08/29/2019 (Originally 08/29/2009)  . HEMOGLOBIN A1C  11/14/2017  . OPHTHALMOLOGY EXAM  12/27/2017  . FOOT EXAM  02/12/2018  . TETANUS/TDAP  08/29/2019  . INFLUENZA VACCINE  Completed  . PNA vac Low Risk Adult  Completed      Plan:     I have personally reviewed, addressed, and noted the following in the patient's chart:  A. Medical and social history B. Use of alcohol, tobacco or illicit drugs  C. Current medications and supplements D. Functional ability and status E.  Nutritional status F.  Physical activity G. Advance directives H. List of other physicians I.  Hospitalizations, surgeries, and ER visits in previous 12 months J.  [place] to include hearing, vision, cognitive, depression L. Referrals and appointments - none  In addition, I have reviewed and discussed with patient certain preventive protocols, quality metrics, and best practice recommendations. A written personalized care plan for preventive services as well as general preventive health recommendations were provided to patient.  See attached scanned questionnaire for additional information.   Signed,   Lindell Noe, MHA, BS, LPN Health Coach

## 2017-08-24 NOTE — Progress Notes (Signed)
I reviewed health advisor's note, was available for consultation, and agree with documentation and plan.  

## 2017-08-28 ENCOUNTER — Encounter: Payer: Self-pay | Admitting: Family Medicine

## 2017-08-28 ENCOUNTER — Ambulatory Visit (INDEPENDENT_AMBULATORY_CARE_PROVIDER_SITE_OTHER): Payer: Medicare Other | Admitting: Family Medicine

## 2017-08-28 VITALS — BP 152/84 | HR 61 | Temp 98.2°F | Ht 67.0 in | Wt 139.2 lb

## 2017-08-28 DIAGNOSIS — R634 Abnormal weight loss: Secondary | ICD-10-CM | POA: Diagnosis not present

## 2017-08-28 DIAGNOSIS — F1010 Alcohol abuse, uncomplicated: Secondary | ICD-10-CM

## 2017-08-28 DIAGNOSIS — D334 Benign neoplasm of spinal cord: Secondary | ICD-10-CM | POA: Diagnosis not present

## 2017-08-28 DIAGNOSIS — E039 Hypothyroidism, unspecified: Secondary | ICD-10-CM

## 2017-08-28 DIAGNOSIS — D649 Anemia, unspecified: Secondary | ICD-10-CM | POA: Diagnosis not present

## 2017-08-28 DIAGNOSIS — I10 Essential (primary) hypertension: Secondary | ICD-10-CM

## 2017-08-28 DIAGNOSIS — N400 Enlarged prostate without lower urinary tract symptoms: Secondary | ICD-10-CM | POA: Diagnosis not present

## 2017-08-28 DIAGNOSIS — J449 Chronic obstructive pulmonary disease, unspecified: Secondary | ICD-10-CM

## 2017-08-28 DIAGNOSIS — E785 Hyperlipidemia, unspecified: Secondary | ICD-10-CM | POA: Diagnosis not present

## 2017-08-28 DIAGNOSIS — F331 Major depressive disorder, recurrent, moderate: Secondary | ICD-10-CM | POA: Diagnosis not present

## 2017-08-28 DIAGNOSIS — Z0000 Encounter for general adult medical examination without abnormal findings: Secondary | ICD-10-CM

## 2017-08-28 DIAGNOSIS — E1169 Type 2 diabetes mellitus with other specified complication: Secondary | ICD-10-CM

## 2017-08-28 DIAGNOSIS — Z7189 Other specified counseling: Secondary | ICD-10-CM

## 2017-08-28 DIAGNOSIS — Z0001 Encounter for general adult medical examination with abnormal findings: Secondary | ICD-10-CM

## 2017-08-28 DIAGNOSIS — I700 Atherosclerosis of aorta: Secondary | ICD-10-CM

## 2017-08-28 DIAGNOSIS — R946 Abnormal results of thyroid function studies: Secondary | ICD-10-CM | POA: Diagnosis not present

## 2017-08-28 LAB — IBC PANEL
Iron: 30 ug/dL — ABNORMAL LOW (ref 42–165)
SATURATION RATIOS: 5.1 % — AB (ref 20.0–50.0)
TRANSFERRIN: 417 mg/dL — AB (ref 212.0–360.0)

## 2017-08-28 LAB — CBC WITH DIFFERENTIAL/PLATELET
Basophils Absolute: 39 cells/uL (ref 0–200)
Basophils Relative: 0.9 %
EOS PCT: 3.7 %
Eosinophils Absolute: 159 cells/uL (ref 15–500)
HEMATOCRIT: 30.8 % — AB (ref 38.5–50.0)
Hemoglobin: 9.4 g/dL — ABNORMAL LOW (ref 13.2–17.1)
Lymphs Abs: 851 cells/uL (ref 850–3900)
MCH: 24 pg — AB (ref 27.0–33.0)
MCHC: 30.5 g/dL — AB (ref 32.0–36.0)
MCV: 78.8 fL — ABNORMAL LOW (ref 80.0–100.0)
MONOS PCT: 13.1 %
MPV: 10.8 fL (ref 7.5–12.5)
NEUTROS PCT: 62.5 %
Neutro Abs: 2688 cells/uL (ref 1500–7800)
Platelets: 249 10*3/uL (ref 140–400)
RBC: 3.91 10*6/uL — ABNORMAL LOW (ref 4.20–5.80)
RDW: 15 % (ref 11.0–15.0)
Total Lymphocyte: 19.8 %
WBC mixed population: 563 cells/uL (ref 200–950)
WBC: 4.3 10*3/uL (ref 3.8–10.8)

## 2017-08-28 LAB — FOLATE

## 2017-08-28 LAB — FERRITIN: Ferritin: 10.7 ng/mL — ABNORMAL LOW (ref 22.0–322.0)

## 2017-08-28 LAB — TSH: TSH: 4.74 u[IU]/mL — ABNORMAL HIGH (ref 0.35–4.50)

## 2017-08-28 LAB — T4, FREE: Free T4: 0.89 ng/dL (ref 0.60–1.60)

## 2017-08-28 LAB — VITAMIN B12: Vitamin B-12: 453 pg/mL (ref 211–911)

## 2017-08-28 NOTE — Assessment & Plan Note (Signed)
Chronic, stable. Continue lipitor 5mg  daily (1/2 tab)

## 2017-08-28 NOTE — Assessment & Plan Note (Addendum)
Ongoing mild depression symptoms despite sertraline 100mg  daily. He finds it helps with irritability. Has seen hospice counseling after sister's death.   No close family or friends - feels isolated. Discussed strategies to deal with this, discussed daytime plans he could participate in including local exercise classes at local senior center.

## 2017-08-28 NOTE — Assessment & Plan Note (Signed)
Preventative protocols reviewed and updated unless pt declined. Discussed healthy diet and lifestyle.  

## 2017-08-28 NOTE — Assessment & Plan Note (Addendum)
Ongoing. Back to drinking regularly. Continue to encourage cessation.  Briefly discussed pharmacotherapy to help - consider gabapentin at future visit.

## 2017-08-28 NOTE — Assessment & Plan Note (Signed)
Chronic, mildly elevated. Compliant with metformin. No changes today.

## 2017-08-28 NOTE — ACP (Advance Care Planning) (Signed)
Advanced directives: copy in chart 11/2013. HCPOA would be daughter, Kalman Nylen in [place]. Would be ok with temporary measures - feeding tube, breathing tube, but doesn't want prolonged life support

## 2017-08-28 NOTE — Patient Instructions (Addendum)
Labs today to further evaluate anemia.  We will be in touch with results. If interested, check with pharmacy about new 2 shot shingles series (shingrix). Return in 3 months for follow up visit.  Blood pressure staying elevated - start monitoring at home and let me know how your readings are doing.   Health Maintenance, Male A healthy lifestyle and preventive care is important for your health and wellness. Ask your health care provider about what schedule of regular examinations is right for you. What should I know about weight and diet? Eat a Healthy Diet  Eat plenty of vegetables, fruits, whole grains, low-fat dairy products, and lean protein.  Do not eat a lot of foods high in solid fats, added sugars, or salt.  Maintain a Healthy Weight Regular exercise can help you achieve or maintain a healthy weight. You should:  Do at least 150 minutes of exercise each week. The exercise should increase your heart rate and make you sweat (moderate-intensity exercise).  Do strength-training exercises at least twice a week.  Watch Your Levels of Cholesterol and Blood Lipids  Have your blood tested for lipids and cholesterol every 5 years starting at 81 years of age. If you are at high risk for heart disease, you should start having your blood tested when you are 81 years old. You may need to have your cholesterol levels checked more often if: ? Your lipid or cholesterol levels are high. ? You are older than 81 years of age. ? You are at high risk for heart disease.  What should I know about cancer screening? Many types of cancers can be detected early and may often be prevented. Lung Cancer  You should be screened every year for lung cancer if: ? You are a current smoker who has smoked for at least 30 years. ? You are a former smoker who has quit within the past 15 years.  Talk to your health care provider about your screening options, when you should start screening, and how often you  should be screened.  Colorectal Cancer  Routine colorectal cancer screening usually begins at 81 years of age and should be repeated every 5-10 years until you are 81 years old. You may need to be screened more often if early forms of precancerous polyps or small growths are found. Your health care provider may recommend screening at an earlier age if you have risk factors for colon cancer.  Your health care provider may recommend using home test kits to check for hidden blood in the stool.  A small camera at the end of a tube can be used to examine your colon (sigmoidoscopy or colonoscopy). This checks for the earliest forms of colorectal cancer.  Prostate and Testicular Cancer  Depending on your age and overall health, your health care provider may do certain tests to screen for prostate and testicular cancer.  Talk to your health care provider about any symptoms or concerns you have about testicular or prostate cancer.  Skin Cancer  Check your skin from head to toe regularly.  Tell your health care provider about any new moles or changes in moles, especially if: ? There is a change in a mole's size, shape, or color. ? You have a mole that is larger than a pencil eraser.  Always use sunscreen. Apply sunscreen liberally and repeat throughout the day.  Protect yourself by wearing long sleeves, pants, a wide-brimmed hat, and sunglasses when outside.  What should I know about heart disease, diabetes,  and high blood pressure?  If you are 37-20 years of age, have your blood pressure checked every 3-5 years. If you are 52 years of age or older, have your blood pressure checked every year. You should have your blood pressure measured twice-once when you are at a hospital or clinic, and once when you are not at a hospital or clinic. Record the average of the two measurements. To check your blood pressure when you are not at a hospital or clinic, you can use: ? An automated blood pressure  machine at a pharmacy. ? A home blood pressure monitor.  Talk to your health care provider about your target blood pressure.  If you are between 47-66 years old, ask your health care provider if you should take aspirin to prevent heart disease.  Have regular diabetes screenings by checking your fasting blood sugar level. ? If you are at a normal weight and have a low risk for diabetes, have this test once every three years after the age of 14. ? If you are overweight and have a high risk for diabetes, consider being tested at a younger age or more often.  A one-time screening for abdominal aortic aneurysm (AAA) by ultrasound is recommended for men aged 68-75 years who are current or former smokers. What should I know about preventing infection? Hepatitis B If you have a higher risk for hepatitis B, you should be screened for this virus. Talk with your health care provider to find out if you are at risk for hepatitis B infection. Hepatitis C Blood testing is recommended for:  Everyone born from [birth year] through 1963.  Anyone with known risk factors for hepatitis C.  Sexually Transmitted Diseases (STDs)  You should be screened each year for STDs including gonorrhea and chlamydia if: ? You are sexually active and are younger than 81 years of age. ? You are older than 81 years of age and your health care provider tells you that you are at risk for this type of infection. ? Your sexual activity has changed since you were last screened and you are at an increased risk for chlamydia or gonorrhea. Ask your health care provider if you are at risk.  Talk with your health care provider about whether you are at high risk of being infected with HIV. Your health care provider may recommend a prescription medicine to help prevent HIV infection.  What else can I do?  Schedule regular health, dental, and eye exams.  Stay current with your vaccines (immunizations).  Do not use any tobacco products,  such as cigarettes, chewing tobacco, and e-cigarettes. If you need help quitting, ask your health care provider.  Limit alcohol intake to no more than 2 drinks per day. One drink equals 12 ounces of beer, 5 ounces of wine, or 1 ounces of hard liquor.  Do not use street drugs.  Do not share needles.  Ask your health care provider for help if you need support or information about quitting drugs.  Tell your health care provider if you often feel depressed.  Tell your health care provider if you have ever been abused or do not feel safe at home. This information is not intended to replace advice given to you by your health care provider. Make sure you discuss any questions you have with your health care provider. Document Released: 01/11/2008 Document Revised: 03/13/2016 Document Reviewed: 04/18/2015 Elsevier Interactive Patient Education  [employer].

## 2017-08-28 NOTE — Assessment & Plan Note (Signed)
Advanced directives: copy in chart 11/2013. HCPOA would be daughter, Angelgabriel Willmore in [place]. Would be ok with temporary measures - feeding tube, breathing tube, but doesn't want prolonged life support

## 2017-08-28 NOTE — Progress Notes (Signed)
BP (!) 152/84 (BP Location: Right Arm, Cuff Size: Normal)   Pulse 61   Temp 98.2 F (36.8 C) (Oral)   Ht 5\' 7"  (1.702 m)   Wt 139 lb 4 oz (63.2 kg)   SpO2 100%   BMI 21.81 kg/m    CC: CPE Subjective:    Patient ID: Justin Benson, male    DOB: 05/13/1935, 83 y.o.   MRN: [id number]  HPI: Justin Benson is a 83 y.o. male presenting on 08/28/2017 for Annual Exam (pt. 2)   Saw Katha Cabal last week  for medicare wellness visit. Note reviewed. Failed hearing screen.   Alcoholism - hasn't been able to stop drinking. Back to 3-6 beers/day. No longer going to AA, not following with sponsor. Interested in medication to assist in quitting.  Recent abd US WNL 04/2017. 3 lb weight gain noted.  Has had several skin cancers removed (basal cell R ear, squamous cell to L arm).  Preventative: COLONOSCOPY Date: 02/2014 1 polyp, diverticulosis, no rpt needed [name]) Prostate cancer screening - strong fmhx prostate cancer. Personal h/o BPH on finasteride. Yearly checks.  Flu shot yearly (pharmacy) Td 2011  Pneumovax 2004, prevnar 2015 zostavax 2012  shingrix - discussed Advanced directives: copy in chart 11/2013. HCPOA would be daughter, Steven Basso in [place]. Would be ok with temporary measures - feeding tube, breathing tube, but doesn't want prolonged life support Seat belt use discussed Sunscreen use discussed. No changing moles on skin. Ex smoker Alcohol - continues drinking  Does crossword puzzles, reads.  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  Relevant past medical, surgical, family and social history reviewed and updated as indicated. Interim medical history since our last visit reviewed. Allergies and medications reviewed and updated. Outpatient  Medications Prior to Visit  Medication Sig Dispense Refill  . amLODipine (NORVASC) 10 MG tablet Take 1 tablet (10 mg total) by mouth daily. 90 tablet 3  . aspirin 81 MG tablet Take 81 mg by mouth daily.      Marland Kitchen atorvastatin (LIPITOR) 10 MG tablet Take 0.5 tablets (5 mg total) by mouth daily at 6 PM. 45 tablet 3  . finasteride (PROSCAR) 5 MG tablet Take 1 tablet (5 mg total) by mouth every evening. 90 tablet 3  . metFORMIN (GLUCOPHAGE) 500 MG tablet TAKE 1 TABLET BY MOUTH TWO  TIMES DAILY WITH MEALS 180 tablet 3  . Multiple Vitamin (MULTIVITAMIN WITH MINERALS) TABS Take 1 tablet by mouth daily.    Marland Kitchen omeprazole (PRILOSEC) 40 MG capsule Take 40 mg by mouth daily.    . quinapril (ACCUPRIL) 20 MG tablet Take 1 tablet (20 mg total) by mouth at bedtime. 90 tablet 3  . quinapril-hydrochlorothiazide (ACCURETIC) 20-25 MG tablet Take 1 tablet by mouth daily. 90 tablet 3  . sertraline (ZOLOFT) 100 MG tablet Take 1 tablet (100 mg total) by mouth daily. 90 tablet 3   No facility-administered medications prior to visit.      Per HPI unless specifically indicated in ROS section below Review of Systems  Constitutional: Negative for activity change, appetite change, chills, fatigue, fever and unexpected weight change.  HENT: Negative for hearing loss.   Eyes: Negative for visual disturbance.  Respiratory: Negative for cough, chest tightness, shortness of breath and wheezing.   Cardiovascular: Negative for chest pain, palpitations and  leg swelling.  Gastrointestinal: Positive for constipation. Negative for abdominal distention, abdominal pain, blood in stool, diarrhea, nausea and vomiting.       Hidden gerd associated cough Hemorrhoids - uses prep H regularly  Genitourinary: Negative for difficulty urinating and hematuria.  Musculoskeletal: Negative for arthralgias, myalgias and neck pain.  Skin: Negative for rash.  Neurological: Negative for dizziness, seizures, syncope and headaches.  Hematological:  Negative for adenopathy. Bruises/bleeds easily.  Psychiatric/Behavioral: Negative for dysphoric mood. The patient is not nervous/anxious.        Objective:    BP (!) 152/84 (BP Location: Right Arm, Cuff Size: Normal)   Pulse 61   Temp 98.2 F (36.8 C) (Oral)   Ht 5\' 7"  (1.702 m)   Wt 139 lb 4 oz (63.2 kg)   SpO2 100%   BMI 21.81 kg/m   Wt Readings from Last 3 Encounters:  08/28/17 139 lb 4 oz (63.2 kg)  08/20/17 136 lb 12 oz (62 kg)  06/24/17 138 lb (62.6 kg)    Physical Exam  Constitutional: He is oriented to person, place, and time. He appears well-developed and well-nourished. No distress.  HENT:  Head: Normocephalic and atraumatic.  Right Ear: Hearing, tympanic membrane, external ear and ear canal normal.  Left Ear: Hearing, tympanic membrane, external ear and ear canal normal.  Nose: Nose normal.  Mouth/Throat: Uvula is midline, oropharynx is clear and moist and mucous membranes are normal. No oropharyngeal exudate, posterior oropharyngeal edema or posterior oropharyngeal erythema.  Eyes: Conjunctivae and EOM are normal. Pupils are equal, round, and reactive to light. No scleral icterus.  Neck: Normal range of motion. Neck supple. Carotid bruit is not present. No thyromegaly present.  Cardiovascular: Normal rate, regular rhythm, normal heart sounds and intact distal pulses.  No murmur heard. Pulses:      Radial pulses are 2+ on the right side, and 2+ on the left side.  Pulmonary/Chest: Effort normal and breath sounds normal. No respiratory distress. He has no wheezes. He has no rales.  Abdominal: Soft. Bowel sounds are normal. He exhibits no distension and no mass. There is no tenderness. There is no rebound and no guarding.  Genitourinary: Rectum normal and prostate normal. Rectal exam shows no external hemorrhoid, no internal hemorrhoid, no fissure, no mass, no tenderness, anal tone normal and guaiac negative stool. Prostate is not enlarged (20gm) and not tender.    Musculoskeletal: Normal range of motion. He exhibits no edema.  Lymphadenopathy:    He has no cervical adenopathy.  Neurological: He is alert and oriented to person, place, and time.  CN grossly intact, station and gait intact  Skin: Skin is warm and dry. No rash noted.  Psychiatric: He has a normal mood and affect. His behavior is normal. Judgment and thought content normal.  Nursing note and vitals reviewed.  Results for orders placed or performed in visit on 08/28/17  T3  Result Value Ref Range   T3, Total 96 76 - 181 ng/dL  T4, free  Result Value Ref Range   Free T4 0.89 0.60 - 1.60 ng/dL  TSH  Result Value Ref Range   TSH 4.74 (H) 0.35 - 4.50 uIU/mL  Vitamin B12  Result Value Ref Range   Vitamin B-12 453 211 - 911 pg/mL  Ferritin  Result Value Ref Range   Ferritin 10.7 (L) 22.0 - 322.0 ng/mL  IBC panel  Result Value Ref Range   Iron 30 (L) 42 - 165 ug/dL   Transferrin 417.0 (H) 212.0 -  360.0 mg/dL   Saturation Ratios 5.1 (L) 20.0 - 50.0 %  Folate  Result Value Ref Range   Folate >23.6 >5.9 ng/mL  Pathologist smear review  Result Value Ref Range   Path Review    Lactate Dehydrogenase  Result Value Ref Range   LDH 125 120 - 250 U/L  CBC with Differential/Platelet  Result Value Ref Range   WBC 4.3 3.8 - 10.8 Thousand/uL   RBC 3.91 (L) 4.20 - 5.80 Million/uL   Hemoglobin 9.4 (L) 13.2 - 17.1 g/dL   HCT 30.8 (L) 38.5 - 50.0 %   MCV 78.8 (L) 80.0 - 100.0 fL   MCH 24.0 (L) 27.0 - 33.0 pg   MCHC 30.5 (L) 32.0 - 36.0 g/dL   RDW 15.0 11.0 - 15.0 %   Platelets 249 140 - 400 Thousand/uL   MPV 10.8 7.5 - 12.5 fL   Neutro Abs 2,688 1,500 - 7,800 cells/uL   Lymphs Abs 851 850 - 3,900 cells/uL   WBC mixed population 563 200 - 950 cells/uL   Eosinophils Absolute 159 15 - 500 cells/uL   Basophils Absolute 39 0 - 200 cells/uL   Neutrophils Relative % 62.5 %   Total Lymphocyte 19.8 %   Monocytes Relative 13.1 %   Eosinophils Relative 3.7 %   Basophils Relative 0.9 %    Lab Results  Component Value Date   HGBA1C 7.3 (H) 08/20/2017    Lab Results  Component Value Date   CHOL 142 08/20/2017   HDL 67.20 08/20/2017   LDLCALC 61 08/20/2017   LDLDIRECT 84.5 05/26/2012   TRIG 67.0 08/20/2017   CHOLHDL 2 08/20/2017    Lab Results  Component Value Date   CREATININE 0.79 08/20/2017   BUN 16 08/20/2017   NA 139 08/20/2017   K 4.3 08/20/2017   CL 100 08/20/2017   CO2 29 08/20/2017    Lab Results  Component Value Date   ALT 52 08/20/2017   AST 22 08/20/2017   ALKPHOS 114 08/20/2017   BILITOT 0.3 08/20/2017       Assessment & Plan:   Problem List Items Addressed This Visit    Abdominal aortic atherosclerosis (HCC)    Continue aspirin, lipitor.       Advanced care planning/counseling discussion    Advanced directives: copy in chart 11/2013. HCPOA would be daughter, Ambrose Wile in [place]. Would be ok with temporary measures - feeding tube, breathing tube, but doesn't want prolonged life support      Alcohol abuse    Ongoing. Back to drinking regularly. Continue to encourage cessation.  Briefly discussed pharmacotherapy to help - consider gabapentin at future visit.       Relevant Orders   Vitamin B1   Anemia    Progressively worsening anemia. Check anemia panel today. Anticipate alcohol related, ?upper GI blood loss. Check LDH r/o hemolysis. UTD colorectal cancer screening.       Relevant Orders   Vitamin B12 (Completed)   Ferritin (Completed)   IBC panel (Completed)   Folate (Completed)   Pathologist smear review (Completed)   Lactate Dehydrogenase (Completed)   CBC with Differential/Platelet (Completed)   Benign prostatic hyperplasia    Chronic, stable. Continue finasteride.       Borderline hypothyroidism    Rpt TFTs      Relevant Orders   T3 (Completed)   T4, free (Completed)   TSH (Completed)   COPD (chronic obstructive pulmonary disease) (HCC)    Chronic, stable off controller medication.  Diabetes  type 2, controlled (HCC)    Chronic, mildly elevated. Compliant with metformin. No changes today.       Essential hypertension    BP elevated today - I asked him to monitor at home and notify me with readings if persistently elevated to change antihypertensive regimen.       Health maintenance examination - Primary    Preventative protocols reviewed and updated unless pt declined. Discussed healthy diet and lifestyle.       HLD (hyperlipidemia)    Chronic, stable. Continue lipitor 5mg  daily (1/2 tab)      MDD (major depressive disorder), recurrent episode, moderate (HCC)    Ongoing mild depression symptoms despite sertraline 100mg  daily. He finds it helps with irritability. Has seen hospice counseling after sister's death.   No close family or friends - feels isolated. Discussed strategies to deal with this, discussed daytime plans he could participate in including local exercise classes at local senior center.       Schwannoma of spinal cord (HCC)    Denies significant back pain or change at this time. Will continue to monitor. Consider update imaging if any new symptoms develop.      Weight loss    3 lb weight gain noted.           Follow up plan: Return in about 3 months (around 11/25/2017) for follow up visit.  [name], MD

## 2017-08-30 ENCOUNTER — Other Ambulatory Visit: Payer: Self-pay | Admitting: Family Medicine

## 2017-08-30 DIAGNOSIS — E039 Hypothyroidism, unspecified: Secondary | ICD-10-CM | POA: Insufficient documentation

## 2017-08-30 DIAGNOSIS — R946 Abnormal results of thyroid function studies: Secondary | ICD-10-CM

## 2017-08-30 DIAGNOSIS — D509 Iron deficiency anemia, unspecified: Secondary | ICD-10-CM | POA: Insufficient documentation

## 2017-08-30 MED ORDER — FERROUS SULFATE 325 (65 FE) MG PO TABS: 325.0000 mg | ORAL_TABLET | Freq: Every day | ORAL | 3 refills | Status: DC

## 2017-08-30 NOTE — Assessment & Plan Note (Signed)
Continue aspirin, lipitor.

## 2017-08-30 NOTE — Assessment & Plan Note (Addendum)
Chronic, stable off controller medication.

## 2017-08-30 NOTE — Assessment & Plan Note (Signed)
Denies significant back pain or change at this time. Will continue to monitor. Consider update imaging if any new symptoms develop.

## 2017-08-30 NOTE — Assessment & Plan Note (Addendum)
Rpt TFTs

## 2017-08-30 NOTE — Assessment & Plan Note (Signed)
3 lb weight gain noted.

## 2017-08-30 NOTE — Assessment & Plan Note (Signed)
BP elevated today - I asked him to monitor at home and notify me with readings if persistently elevated to change antihypertensive regimen.

## 2017-08-30 NOTE — Assessment & Plan Note (Addendum)
Progressively worsening anemia. Check anemia panel today. Anticipate alcohol related, ?upper GI blood loss. Check LDH r/o hemolysis. UTD colorectal cancer screening.

## 2017-08-30 NOTE — Assessment & Plan Note (Addendum)
Chronic, stable. Continue finasteride.

## 2017-09-01 LAB — T3: T3, Total: 96 ng/dL (ref 76–181)

## 2017-09-01 LAB — PATHOLOGIST SMEAR REVIEW

## 2017-09-01 LAB — LACTATE DEHYDROGENASE: LDH: 125 U/L (ref 120–250)

## 2017-09-01 LAB — VITAMIN B1: VITAMIN B1 (THIAMINE): 19 nmol/L (ref 8–30)

## 2017-09-26 ENCOUNTER — Telehealth: Payer: Self-pay | Admitting: Family Medicine

## 2017-09-26 NOTE — Telephone Encounter (Signed)
Copied from [place] [id number]. Topic: Quick Communication - See Telephone Encounter >> Sep 26, 2017 10:49 AM Ahmed Prima L wrote: CRM for notification. See Telephone encounter for:   09/26/17.  Pt said that Dr Darnell Level wanted him to call and let him know if and when his BP started to go back up. He also said not now but lately he has had some black stool.  Feb 28  141/53 129/54 135/48 141/65 143/71 148/64 129/48

## 2017-09-29 NOTE — Telephone Encounter (Signed)
Spoke with pt relaying message from Dr. Darnell Level.  Pt verbalizes understanding.

## 2017-09-29 NOTE — Telephone Encounter (Signed)
Blood pressures are ok for now. Continue to monitor and let us know if staying >150/90.  As far as black stools - likely iron causing black stools as we recently started this. Keep an eye on this, let us know if any blood.

## 2017-11-25 ENCOUNTER — Encounter: Payer: Self-pay | Admitting: Family Medicine

## 2017-11-25 ENCOUNTER — Ambulatory Visit (INDEPENDENT_AMBULATORY_CARE_PROVIDER_SITE_OTHER): Payer: Medicare Other | Admitting: Family Medicine

## 2017-11-25 VITALS — BP 126/64 | HR 60 | Temp 97.8°F | Ht 67.25 in | Wt 134.5 lb

## 2017-11-25 DIAGNOSIS — D500 Iron deficiency anemia secondary to blood loss (chronic): Secondary | ICD-10-CM

## 2017-11-25 DIAGNOSIS — F1010 Alcohol abuse, uncomplicated: Secondary | ICD-10-CM

## 2017-11-25 DIAGNOSIS — D649 Anemia, unspecified: Secondary | ICD-10-CM | POA: Diagnosis not present

## 2017-11-25 DIAGNOSIS — K7010 Alcoholic hepatitis without ascites: Secondary | ICD-10-CM

## 2017-11-25 DIAGNOSIS — F331 Major depressive disorder, recurrent, moderate: Secondary | ICD-10-CM

## 2017-11-25 DIAGNOSIS — I10 Essential (primary) hypertension: Secondary | ICD-10-CM

## 2017-11-25 DIAGNOSIS — E039 Hypothyroidism, unspecified: Secondary | ICD-10-CM

## 2017-11-25 DIAGNOSIS — E1169 Type 2 diabetes mellitus with other specified complication: Secondary | ICD-10-CM | POA: Diagnosis not present

## 2017-11-25 DIAGNOSIS — R946 Abnormal results of thyroid function studies: Secondary | ICD-10-CM

## 2017-11-25 LAB — CBC WITH DIFFERENTIAL/PLATELET
BASOS PCT: 0.7 % (ref 0.0–3.0)
Basophils Absolute: 0 10*3/uL (ref 0.0–0.1)
EOS ABS: 0.1 10*3/uL (ref 0.0–0.7)
EOS PCT: 2.8 % (ref 0.0–5.0)
HEMATOCRIT: 37.3 % — AB (ref 39.0–52.0)
HEMOGLOBIN: 12.4 g/dL — AB (ref 13.0–17.0)
LYMPHS PCT: 17 % (ref 12.0–46.0)
Lymphs Abs: 0.8 10*3/uL (ref 0.7–4.0)
MCHC: 33.2 g/dL (ref 30.0–36.0)
MCV: 85.3 fl (ref 78.0–100.0)
Monocytes Absolute: 0.6 10*3/uL (ref 0.1–1.0)
Monocytes Relative: 11.4 % (ref 3.0–12.0)
Neutro Abs: 3.3 10*3/uL (ref 1.4–7.7)
Neutrophils Relative %: 68.1 % (ref 43.0–77.0)
Platelets: 181 10*3/uL (ref 150.0–400.0)
RBC: 4.37 Mil/uL (ref 4.22–5.81)
RDW: 22.3 % — AB (ref 11.5–15.5)
WBC: 4.9 10*3/uL (ref 4.0–10.5)

## 2017-11-25 LAB — FERRITIN: FERRITIN: 28 ng/mL (ref 22.0–322.0)

## 2017-11-25 LAB — TSH: TSH: 4.2 u[IU]/mL (ref 0.35–4.50)

## 2017-11-25 MED ORDER — ASPIRIN 81 MG PO TABS: 81.0000 mg | ORAL_TABLET | ORAL | Status: DC

## 2017-11-25 NOTE — Patient Instructions (Addendum)
Labs today.  Stop aspirin.  Continue iron.  We will refer you back to GI.

## 2017-11-25 NOTE — Assessment & Plan Note (Signed)
Continue metformin. Reviewed pros/cons of aspirin use. In IDA, will discontinue iron at this time.

## 2017-11-25 NOTE — Assessment & Plan Note (Signed)
Update TSH

## 2017-11-25 NOTE — Assessment & Plan Note (Signed)
Ongoing, stable on sertraline. Declines change.

## 2017-11-25 NOTE — Progress Notes (Signed)
BP 126/64 (BP Location: Left Arm, Patient Position: Sitting, Cuff Size: Normal)   Pulse 60   Temp 97.8 F (36.6 C) (Oral)   Ht 5' 7.25" (1.708 m)   Wt 134 lb 8 oz (61 kg)   SpO2 99%   BMI 20.91 kg/m    CC:  3 mo f/u visit Subjective:    Patient ID: Justin Benson, male    DOB: 1934-09-09, 83 y.o.   MRN: [id number]  HPI: Justin Benson is a 83 y.o. male presenting on 11/25/2017 for 3 mo follow-up   Alcoholism - continues regularly drinking. Struggles with abstinence. We have previously discussed AA - he has not attended. Endorses decreased appetite, mild weight loss again noted.   DM - managing with metformin 500mg  once daily. He is also on aspirin 81mg  daily.   MDD - feels mood is stable on sertraline.   COLONOSCOPY 02/2014 1 polyp, diverticulosis, no rpt needed [name])  Relevant past medical, surgical, family and social history reviewed and updated as indicated. Interim medical history since our last visit reviewed. Allergies and medications reviewed and updated. Outpatient Medications Prior to Visit  Medication Sig Dispense Refill  . amLODipine (NORVASC) 10 MG tablet Take 1 tablet (10 mg total) by mouth daily. 90 tablet 3  . atorvastatin (LIPITOR) 10 MG tablet Take 0.5 tablets (5 mg total) by mouth daily at 6 PM. 45 tablet 3  . ferrous sulfate 325 (65 FE) MG tablet Take 1 tablet (325 mg total) by mouth daily with breakfast.  3  . finasteride (PROSCAR) 5 MG tablet Take 1 tablet (5 mg total) by mouth every evening. 90 tablet 3  . metFORMIN (GLUCOPHAGE) 500 MG tablet TAKE 1 TABLET BY MOUTH TWO  TIMES DAILY WITH MEALS 180 tablet 3  . Multiple Vitamin (MULTIVITAMIN WITH MINERALS) TABS Take 1 tablet by mouth daily.    Marland Kitchen omeprazole (PRILOSEC) 40 MG capsule Take 40 mg by mouth daily.    . quinapril (ACCUPRIL) 20 MG tablet Take 1 tablet (20 mg total) by mouth at bedtime. 90 tablet 3  . quinapril-hydrochlorothiazide (ACCURETIC) 20-25 MG tablet Take 1 tablet by mouth daily. 90 tablet 3    . sertraline (ZOLOFT) 100 MG tablet Take 1 tablet (100 mg total) by mouth daily. 90 tablet 3  . aspirin 81 MG tablet Take 81 mg by mouth daily.       No facility-administered medications prior to visit.      Per HPI unless specifically indicated in ROS section below Review of Systems     Objective:    BP 126/64 (BP Location: Left Arm, Patient Position: Sitting, Cuff Size: Normal)   Pulse 60   Temp 97.8 F (36.6 C) (Oral)   Ht 5' 7.25" (1.708 m)   Wt 134 lb 8 oz (61 kg)   SpO2 99%   BMI 20.91 kg/m   Wt Readings from Last 3 Encounters:  11/25/17 134 lb 8 oz (61 kg)  08/28/17 139 lb 4 oz (63.2 kg)  08/20/17 136 lb 12 oz (62 kg)    Physical Exam  Constitutional: He appears well-developed and well-nourished. No distress.  HENT:  Mouth/Throat: Oropharynx is clear and moist. No oropharyngeal exudate.  Cardiovascular: Normal rate, regular rhythm and normal heart sounds.  No murmur heard. Pulmonary/Chest: Effort normal and breath sounds normal. No respiratory distress. He has no wheezes. He has no rales.  Abdominal: Soft. Bowel sounds are normal. He exhibits no distension and no mass. There is no hepatosplenomegaly. There  is no tenderness. There is no rigidity, no rebound, no guarding and no CVA tenderness. No hernia.  Musculoskeletal: He exhibits no edema.  Skin: No rash noted.  Psychiatric: He has a normal mood and affect.  Nursing note and vitals reviewed.  Results for orders placed or performed in visit on 08/28/17  T3  Result Value Ref Range   T3, Total 96 76 - 181 ng/dL  T4, free  Result Value Ref Range   Free T4 0.89 0.60 - 1.60 ng/dL  TSH  Result Value Ref Range   TSH 4.74 (H) 0.35 - 4.50 uIU/mL  Vitamin B12  Result Value Ref Range   Vitamin B-12 453 211 - 911 pg/mL  Ferritin  Result Value Ref Range   Ferritin 10.7 (L) 22.0 - 322.0 ng/mL  IBC panel  Result Value Ref Range   Iron 30 (L) 42 - 165 ug/dL   Transferrin 417.0 (H) 212.0 - 360.0 mg/dL   Saturation  Ratios 5.1 (L) 20.0 - 50.0 %  Folate  Result Value Ref Range   Folate >23.6 >5.9 ng/mL  Pathologist smear review  Result Value Ref Range   Path Review    Vitamin B1  Result Value Ref Range   Vitamin B1 (Thiamine) 19 8 - 30 nmol/L  Lactate Dehydrogenase  Result Value Ref Range   LDH 125 120 - 250 U/L  CBC with Differential/Platelet  Result Value Ref Range   WBC 4.3 3.8 - 10.8 Thousand/uL   RBC 3.91 (L) 4.20 - 5.80 Million/uL   Hemoglobin 9.4 (L) 13.2 - 17.1 g/dL   HCT 30.8 (L) 38.5 - 50.0 %   MCV 78.8 (L) 80.0 - 100.0 fL   MCH 24.0 (L) 27.0 - 33.0 pg   MCHC 30.5 (L) 32.0 - 36.0 g/dL   RDW 15.0 11.0 - 15.0 %   Platelets 249 140 - 400 Thousand/uL   MPV 10.8 7.5 - 12.5 fL   Neutro Abs 2,688 1,500 - 7,800 cells/uL   Lymphs Abs 851 850 - 3,900 cells/uL   WBC mixed population 563 200 - 950 cells/uL   Eosinophils Absolute 159 15 - 500 cells/uL   Basophils Absolute 39 0 - 200 cells/uL   Neutrophils Relative % 62.5 %   Total Lymphocyte 19.8 %   Monocytes Relative 13.1 %   Eosinophils Relative 3.7 %   Basophils Relative 0.9 %   Lab Results  Component Value Date   HGBA1C 7.3 (H) 08/20/2017   US Abdomen Complete CLINICAL DATA:  Follow-up alcoholic hepatitis, weight loss  EXAM: ABDOMEN ULTRASOUND COMPLETE  COMPARISON:  06/03/2014  FINDINGS: Gallbladder: Prior cholecystectomy  Common bile duct: Diameter: Normal caliber, 6 mm  Liver: No focal lesion identified. Within normal limits in parenchymal echogenicity. Portal vein is patent on color Doppler imaging with normal direction of blood flow towards the liver.  IVC: No abnormality visualized.  Pancreas: Visualized portion unremarkable.  Spleen: Size and appearance within normal limits.  Right Kidney: Length: 10.8 cm. Echogenicity within normal limits. No mass or hydronephrosis visualized.  Left Kidney: Length: 9.8 cm. Normal echotexture. No hydronephrosis. Benign-appearing cysts, the largest measuring 4.2 cm in the  upper pole.  Abdominal aorta: No aneurysm visualized.  Other findings: None.  IMPRESSION: No focal hepatic abnormality.  Prior cholecystectomy.  No acute findings.  Electronically Signed   By: Rolm Baptise M.D.   On: 05/20/2017 10:00      Assessment & Plan:   Problem List Items Addressed This Visit    Alcohol abuse  Ongoing habitual use. Continued to encourage cessation. Not attending AA.  New IDA - see below.  B12, thiamine, folate normal 07/2017.      Alcoholic hepatitis without ascites   Relevant Orders   Ambulatory referral to Gastroenterology   Borderline hypothyroidism    Update TSH.      Relevant Orders   TSH   Diabetes type 2, controlled ([place])    Continue metformin. Reviewed pros/cons of aspirin use. In IDA, will discontinue iron at this time.       Essential hypertension    Chronic, stable. Continue amlodipine, quinapril/hctz and plain quinapril.      Iron deficiency anemia - Primary    I think this is related to drinking and chronic blood loss as of yet unclear source. Reviewed reassuring colonoscopy 2015 from. Continue PPI, continue encouraging alcohol cessation. Will refer back to GI for further eval of IDA in habitual drinker. Pt agrees with plan.       Relevant Orders   CBC with Differential/Platelet   Ferritin   Ambulatory referral to Gastroenterology   MDD (major depressive disorder), recurrent episode, moderate (HCC)    Ongoing, stable on sertraline. Declines change.          Meds ordered this encounter  Medications  . DISCONTD: aspirin 81 MG tablet    Sig: Take 1 tablet (81 mg total) by mouth every Monday, Wednesday, and Friday.   Orders Placed This Encounter  Procedures  . CBC with Differential/Platelet  . Ferritin  . TSH  . Ambulatory referral to Gastroenterology    Referral Priority:   Routine    Referral Type:   Consultation    Referral Reason:   Specialty Services Required    Number of Visits Requested:   1    Follow  up plan: Return in about 3 months (around 02/24/2018).  [name], MD

## 2017-11-25 NOTE — Assessment & Plan Note (Addendum)
I think this is related to drinking and chronic blood loss as of yet unclear source. Reviewed reassuring colonoscopy 2015 from. Continue PPI, continue encouraging alcohol cessation. Will refer back to GI for further eval of IDA in habitual drinker. Pt agrees with plan.

## 2017-11-25 NOTE — Assessment & Plan Note (Signed)
Ongoing habitual use. Continued to encourage cessation. Not attending AA.  New IDA - see below.  B12, thiamine, folate normal 07/2017.

## 2017-11-25 NOTE — Assessment & Plan Note (Signed)
Chronic, stable. Continue amlodipine, quinapril/hctz and plain quinapril.

## 2017-11-26 ENCOUNTER — Ambulatory Visit (INDEPENDENT_AMBULATORY_CARE_PROVIDER_SITE_OTHER): Payer: Medicare Other | Admitting: Physician Assistant

## 2017-11-26 ENCOUNTER — Encounter: Payer: Self-pay | Admitting: Physician Assistant

## 2017-11-26 VITALS — BP 140/52 | HR 76 | Ht 67.0 in | Wt 137.4 lb

## 2017-11-26 DIAGNOSIS — K219 Gastro-esophageal reflux disease without esophagitis: Secondary | ICD-10-CM | POA: Diagnosis not present

## 2017-11-26 DIAGNOSIS — D509 Iron deficiency anemia, unspecified: Secondary | ICD-10-CM | POA: Diagnosis not present

## 2017-11-26 NOTE — Progress Notes (Signed)
Chief Complaint: Anemia  HPI:    Justin Benson is an 83 year old Caucasian male with a past medical history as listed below including alcoholic hepatitis without ascites, continued alcoholism, COPD and GERD, who follows with Dr. [name] and was referred to me by [name], MD for a complaint of anemia.      02/28/2014 colonoscopy, history of subcentimeter TA removed 2010 by Dr. [name]; finding of one small polyp in several diverticulum in the left colon otherwise normal.  Pathology showed benign polyp.  No recall colonoscopy was recommended.    11/25/2017 CBC with a hemoglobin of 12.4 (9.4 08/28/2017), MCV normal (78.8 08/28/2017), ferritin normal at 28 (10.73 months ago).    11/25/2017 PCP concerned regarding chronic drinking and blood loss of unclear source.  Recommended GI work-up.    Today, explains that he knows that he "drinks more than I should as far as alcohol".  Patient is aware that this may be adding to some of the symptoms he describes today including reflux.  Also describes chronic "throat clearing".  He is on Omeprazole 40 mg daily now and is not having overt heartburn or reflux but does continue with his throat clearing.  This was increased to twice daily at some point but then decreased back down per patient.  It never helped with his throat clearing.    Patient denies seeing any bright red blood in his stool.  He does describe a black look after he started taking iron.  He has continued on iron but this black stool is gone away.    Denies fever, chills, weight loss, change in bowel habits, shortness of breath, palpitations or fatigue.  Past Medical History:  Diagnosis Date  . AAA (abdominal aortic aneurysm) ([place]) 05/2012   mild 2.3 cm , rpt 1 year  . Alcoholic hepatitis without ascites 06/02/2014   Normal viral hep panel, normal abd [place] (2015)  . Arthritis    fingers and hip  . BPH (benign prostatic hypertrophy)   . Choledocholithiasis 11/2012   s/p ERCP, s/p lap chole  . COPD  (chronic obstructive pulmonary disease) ([place])   . Depression   . Diabetes mellitus 4/02  . GERD (gastroesophageal reflux disease)   . Hyperlipidemia   . Hypertension 1996  . Pneumonia as a baby  . 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 Schwannoma of spinal cord ([place]) 11/2012, 09/2015   medial to R psoas muscle, 4cm size  . 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 BASAL CELL CARCINOMA EXCISION Right 05/2017   right ear; Dr. Elvera Lennox, [facility]  . CHOLECYSTECTOMY N/A 03/08/2013   Procedure: LAPAROSCOPIC CHOLECYSTECTOMY WITH INTRAOPERATIVE CHOLANGIOGRAM;  Surgeon: Harl Bowie, MD;  Location: [facility];  Service: General;  Laterality: N/A;  . CIRCUMCISION  10/30/09   Dr Gaynelle Arabian  . COLONOSCOPY  11/2008   2 small polyps (1 tubular), diverticulosis, lipoma, int/ext hemorrhoids [name])  . COLONOSCOPY  02/2014   1 polyp, diverticulosis, no rpt needed [name])  . ENDOSCOPIC RETROGRADE CHOLANGIOPANCREATOGRAPHY (ERCP) WITH PROPOFOL N/A 12/24/2012   Procedure: ENDOSCOPIC RETROGRADE CHOLANGIOPANCREATOGRAPHY (ERCP) WITH PROPOFOL;  Surgeon: Milus Banister, MD;  Location: [facility];  Service: Endoscopy;  Laterality: N/A;  . LAPAROSCOPIC CHOLECYSTECTOMY  02/2013   Dr. Ninfa Linden    Current Outpatient Medications  Medication Sig Dispense Refill  . amLODipine (NORVASC) 10 MG tablet Take 1 tablet (10 mg total) by mouth daily. 90 tablet  3  . atorvastatin (LIPITOR) 10 MG tablet Take 0.5 tablets (5 mg total) by mouth daily at 6 PM. 45 tablet 3  . ferrous sulfate 325 (65 FE) MG tablet Take 1 tablet (325 mg total) by mouth daily with breakfast.  3  . finasteride (PROSCAR) 5 MG tablet Take 1 tablet (5 mg total) by mouth every evening. 90 tablet 3  . metFORMIN (GLUCOPHAGE) 500 MG tablet TAKE 1 TABLET BY MOUTH TWO  TIMES DAILY WITH MEALS 180 tablet 3  . Multiple Vitamin (MULTIVITAMIN WITH MINERALS) TABS Take 1 tablet by mouth  daily.    Marland Kitchen omeprazole (PRILOSEC) 40 MG capsule Take 40 mg by mouth daily.    . quinapril (ACCUPRIL) 20 MG tablet Take 1 tablet (20 mg total) by mouth at bedtime. 90 tablet 3  . quinapril-hydrochlorothiazide (ACCURETIC) 20-25 MG tablet Take 1 tablet by mouth daily. 90 tablet 3  . sertraline (ZOLOFT) 100 MG tablet Take 1 tablet (100 mg total) by mouth daily. 90 tablet 3   No current facility-administered medications for this visit.     Allergies as of 11/26/2017  . (No Known Allergies)    Family History  Problem Relation Age of Onset  . Prostate cancer Father   . Aneurysm Father        aortic  . Stroke Father   . Cancer Sister        bladder (nonsmoker)  . Hodgkin's lymphoma Brother 77  . Prostate cancer Brother 83       1/2  . Prostate cancer Brother 54       1/2  . Other Sister        cerebral hemm  . Lupus Sister   . Diabetes Brother   . Colon cancer Neg Hx     Social History   Socioeconomic History  . Marital status: Single    Spouse name: Not on file  . Number of children: 1  . Years of education: Not on file  . Highest education level: Not on file  Occupational History  . Occupation: Chartered certified accountant: retired    Comment: retired 1999  Social Needs  . Financial resource strain: Not on file  . Food insecurity:    Worry: Not on file    Inability: Not on file  . Transportation needs:    Medical: Not on file    Non-medical: Not on file  Tobacco Use  . Smoking status: Former Smoker    Packs/day: 2.00    Years: 30.00    Pack years: 60.00    Types: Cigarettes    Start date: 07/29/1962    Last attempt to quit: 07/29/1992    Years since quitting: 25.3  . Smokeless tobacco: Never Used  Substance and Sexual Activity  . Alcohol use: Yes    Alcohol/week: 7.2 oz    Types: 12 Cans of beer per week    Comment: rare---recently stopped drinking as much  . Drug use: No  . Sexual activity: Yes  Lifestyle  . Physical activity:    Days per week: Not on  file    Minutes per session: Not on file  . Stress: Not on file  Relationships  . Social connections:    Talks on phone: Not on file    Gets together: Not on file    Attends religious service: Not on file    Active member of club or organization: Not on file    Attends meetings of clubs or  organizations: Not on file    Relationship status: Not on file  . Intimate partner violence:    Fear of current or ex partner: Not on file    Emotionally abused: Not on file    Physically abused: Not on file    Forced sexual activity: Not on file  Other Topics Concern  . 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    Review of Systems:    Constitutional: No weight loss, fever or chills Skin: No rash Cardiovascular: No chest pain  Respiratory: No SOB  Gastrointestinal: See HPI and otherwise negative Genitourinary: No dysuria  Neurological: No headache, dizziness or syncope Musculoskeletal: No new muscle or joint pain Hematologic: No bleeding Psychiatric: No history of depression or anxiety   Physical Exam:  Vital signs: ,BP (!) 140/52 (BP Location: Left Arm, Patient Position: Sitting, Cuff Size: Normal)   Pulse 76   Ht 5\' 7"  (1.702 m)   Wt 137 lb 6 oz (62.3 kg)   BMI 21.52 kg/m   Constitutional:   Pleasant Caucasian male appears to be in NAD, Well developed, Well nourished, alert and cooperative Head:  Normocephalic and atraumatic. Eyes:   PEERL, EOMI. No icterus. Conjunctiva pink. Ears:  Normal auditory acuity. Neck:  Supple Throat: Oral cavity and pharynx without inflammation, swelling or lesion.  Respiratory: Respirations even and unlabored. Lungs clear to auscultation bilaterally.   No wheezes, crackles, or rhonchi.    Cardiovascular: Normal S1, S2. No MRG. Regular rate and rhythm. No peripheral edema, cyanosis or pallor.  Gastrointestinal:  Soft, nondistended, mild epigastric ttp, No rebound or guarding. Normal bowel sounds. No appreciable masses or hepatomegaly. Rectal:  Not performed.  Msk:  Symmetrical without gross deformities. Without edema, no deformity or joint abnormality.  Neurologic:  Alert and  oriented x4;  grossly normal neurologically.  Skin:   Dry and intact without significant lesions or rashes. Psychiatric: Demonstrates good judgement and reason without abnormal affect or behaviors.  MOST RECENT LABS AND IMAGING: CBC    Component Value Date/Time   WBC 4.9 11/25/2017 1308   RBC 4.37 11/25/2017 1308   HGB 12.4 (L) 11/25/2017 1308   HCT 37.3 (L) 11/25/2017 1308   PLT 181.0 11/25/2017 1308   MCV 85.3 11/25/2017 1308   MCH 24.0 (L) 08/28/2017 1103   MCHC 33.2 11/25/2017 1308   RDW 22.3 (H) 11/25/2017 1308   LYMPHSABS 0.8 11/25/2017 1308   MONOABS 0.6 11/25/2017 1308   EOSABS 0.1 11/25/2017 1308   BASOSABS 0.0 11/25/2017 1308    CMP     Component Value Date/Time   NA 139 08/20/2017 0817   K 4.3 08/20/2017 0817   CL 100 08/20/2017 0817   CO2 29 08/20/2017 0817   GLUCOSE 150 (H) 08/20/2017 0817   BUN 16 08/20/2017 0817   CREATININE 0.79 08/20/2017 0817   CALCIUM 9.1 08/20/2017 0817   PROT 6.6 08/20/2017 0817   ALBUMIN 4.2 08/20/2017 0817   AST 22 08/20/2017 0817   ALT 52 08/20/2017 0817   ALKPHOS 114 08/20/2017 0817   BILITOT 0.3 08/20/2017 0817   GFRNONAA 90 (L) 02/26/2013 1100   GFRAA >90 02/26/2013 1100    Assessment: 1.  Iron deficiency anemia: Improvement of hemoglobin and ferritin  after 3 months on iron supplementation, concern from primary care provider for GI source of blood loss, patient is a habitual drinker, last colonoscopy 2015 for history of a tubular adenoma in 2010, no polyps at that time, no previous EGD; consider GI source of blood loss versus anemia  from alcohol use versus other 2.  GERD: Currently controlled on Omeprazole 40 mg daily with continued throat clearing; consider GERD versus esophagitis  Plan: 1.  Patient is of good health and is requesting further evaluation.  Scheduled patient for EGD and colonoscopy in the [place].  Did discuss risk, benefits, limitations and alternatives. 2.  Continue Omeprazole 40 mg daily, 30-60 minutes before breakfast for reflux symptoms. 3.  Patient was advised to decrease alcohol intake 4.  Patient to follow in clinic per recommendations from Dr. [name] after time of procedures.  Ellouise Newer, PA-C [facility] 11/26/2017, 1:25 PM  Cc: [name], MD

## 2017-11-26 NOTE — Patient Instructions (Signed)
Continue Omeprazole 40 mg daily   You have been scheduled for an endoscopy and colonoscopy. Please follow the written instructions given to you at your visit today. Please pick up your prep supplies at the pharmacy within the next 1-3 days. If you use inhalers (even only as needed), please bring them with you on the day of your procedure. Your physician has requested that you go to www.startemmi.com and enter the access code given to you at your visit today. This web site gives a general overview about your procedure. However, you should still follow specific instructions given to you by our office regarding your preparation for the procedure.

## 2017-11-27 NOTE — Progress Notes (Signed)
I agree with the above note, plan 

## 2017-11-28 ENCOUNTER — Other Ambulatory Visit: Payer: Self-pay | Admitting: Emergency Medicine

## 2017-11-28 DIAGNOSIS — D509 Iron deficiency anemia, unspecified: Secondary | ICD-10-CM

## 2017-11-28 MED ORDER — PEG 3350-KCL-NA BICARB-NACL 420 G PO SOLR: 4000.0000 mL | Freq: Once | ORAL | 0 refills | Status: AC

## 2018-01-26 HISTORY — PX: COLONOSCOPY: SHX174

## 2018-01-26 HISTORY — PX: ESOPHAGOGASTRODUODENOSCOPY: SHX1529

## 2018-01-26 LAB — HM DIABETES EYE EXAM

## 2018-01-27 ENCOUNTER — Encounter: Payer: Self-pay | Admitting: Gastroenterology

## 2018-01-28 ENCOUNTER — Encounter: Payer: Medicare Other | Admitting: Internal Medicine

## 2018-02-10 ENCOUNTER — Encounter: Payer: Self-pay | Admitting: Gastroenterology

## 2018-02-10 ENCOUNTER — Ambulatory Visit (AMBULATORY_SURGERY_CENTER): Payer: Medicare Other | Admitting: Gastroenterology

## 2018-02-10 VITALS — BP 158/68 | HR 56 | Temp 97.8°F | Resp 18 | Ht 67.0 in | Wt 137.0 lb

## 2018-02-10 DIAGNOSIS — D509 Iron deficiency anemia, unspecified: Secondary | ICD-10-CM

## 2018-02-10 DIAGNOSIS — K2980 Duodenitis without bleeding: Secondary | ICD-10-CM | POA: Diagnosis not present

## 2018-02-10 DIAGNOSIS — K2970 Gastritis, unspecified, without bleeding: Secondary | ICD-10-CM

## 2018-02-10 DIAGNOSIS — K635 Polyp of colon: Secondary | ICD-10-CM

## 2018-02-10 DIAGNOSIS — K3189 Other diseases of stomach and duodenum: Secondary | ICD-10-CM | POA: Diagnosis not present

## 2018-02-10 DIAGNOSIS — D123 Benign neoplasm of transverse colon: Secondary | ICD-10-CM

## 2018-02-10 MED ORDER — SODIUM CHLORIDE 0.9 % IV SOLN: 500.0000 mL | Freq: Once | INTRAVENOUS | Status: DC

## 2018-02-10 NOTE — Progress Notes (Signed)
Called to room to assist during endoscopic procedure.  Patient ID and intended procedure confirmed with present staff. Received instructions for my participation in the procedure from the performing physician.  

## 2018-02-10 NOTE — Op Note (Signed)
[place] Patient Name: Justin Benson Procedure Date: 02/10/2018 10:15 AM MRN: [id number] Endoscopist: Milus Banister , MD Age: 83 Referring MD:  Date of Birth: [birth year] Gender: Male Account #: [id number] Procedure:                Colonoscopy Indications:              Iron deficiency anemia Medicines:                Monitored Anesthesia Care Procedure:                Pre-Anesthesia Assessment:                           - Prior to the procedure, a History and Physical                            was performed, and patient medications and                            allergies were reviewed. The patient's tolerance of                            previous anesthesia was also reviewed. The risks                            and benefits of the procedure and the sedation                            options and risks were discussed with the patient.                            All questions were answered, and informed consent                            was obtained. Prior Anticoagulants: The patient has                            taken no previous anticoagulant or antiplatelet                            agents. ASA Grade Assessment: II - A patient with                            mild systemic disease. After reviewing the risks                            and benefits, the patient was deemed in                            satisfactory condition to undergo the procedure.                           After obtaining informed consent, the colonoscope  was passed under direct vision. Throughout the                            procedure, the patient's blood pressure, pulse, and                            oxygen saturations were monitored continuously. The                            Colonoscope was introduced through the anus and                            advanced to the the cecum, identified by                            appendiceal orifice and ileocecal valve. The                          colonoscopy was performed without difficulty. The                            patient tolerated the procedure well. The quality                            of the bowel preparation was good. Scope In: 10:20:17 AM Scope Out: 10:30:26 AM Scope Withdrawal Time: 0 hours 7 minutes 10 seconds  Total Procedure Duration: 0 hours 10 minutes 9 seconds  Findings:                 A 3 mm polyp was found in the transverse colon. The                            polyp was sessile. The polyp was removed with a                            cold snare. Resection and retrieval were complete.                           The exam was otherwise without abnormality on                            direct and retroflexion views. Complications:            No immediate complications. Estimated blood loss:                            None. Estimated Blood Loss:     Estimated blood loss: none. Impression:               - One 3 mm polyp in the transverse colon, removed                            with a cold snare. Resected and retrieved.                           -  The examination was otherwise normal on direct                            and retroflexion views. Recommendation:           - Patient has a contact number available for                            emergencies. The signs and symptoms of potential                            delayed complications were discussed with the                            patient. Return to normal activities tomorrow.                            Written discharge instructions were provided to the                            patient.                           - Resume previous diet.                           - Continue present medications.                           Await final pathology, you probably do not need any                            further colon cancer screening tests (including                            stool testing). These types of tests generally stop                             around age 6-80. Milus Banister, MD 02/10/2018 10:32:53 AM This report has been signed electronically.

## 2018-02-10 NOTE — Patient Instructions (Signed)
*  Handouts given on polyps and gastritis.    YOU HAD AN ENDOSCOPIC PROCEDURE TODAY AT [facility]:   Refer to the procedure report that was given to you for any specific questions about what was found during the examination.  If the procedure report does not answer your questions, please call your gastroenterologist to clarify.  If you requested that your care partner not be given the details of your procedure findings, then the procedure report has been included in a sealed envelope for you to review at your convenience later.  YOU SHOULD EXPECT: Some feelings of bloating in the abdomen. Passage of more gas than usual.  Walking can help get rid of the air that was put into your GI tract during the procedure and reduce the bloating. If you had a lower endoscopy (such as a colonoscopy or flexible sigmoidoscopy) you may notice spotting of blood in your stool or on the toilet paper. If you underwent a bowel prep for your procedure, you may not have a normal bowel movement for a few days.  Please Note:  You might notice some irritation and congestion in your nose or some drainage.  This is from the oxygen used during your procedure.  There is no need for concern and it should clear up in a day or so.  SYMPTOMS TO REPORT IMMEDIATELY:   Following lower endoscopy (colonoscopy or flexible sigmoidoscopy):  Excessive amounts of blood in the stool  Significant tenderness or worsening of abdominal pains  Swelling of the abdomen that is new, acute  Fever of 100F or higher   Following upper endoscopy (EGD)  Vomiting of blood or coffee ground material  New chest pain or pain under the shoulder blades  Painful or persistently difficult swallowing  New shortness of breath  Fever of 100F or higher  Black, tarry-looking stools  For urgent or emergent issues, a gastroenterologist can be reached at any hour by calling [phone].   DIET:  We do recommend a small meal at first, but  then you may proceed to your regular diet.  Drink plenty of fluids but you should avoid alcoholic beverages for 24 hours.  ACTIVITY:  You should plan to take it easy for the rest of today and you should NOT DRIVE or use heavy machinery until tomorrow (because of the sedation medicines used during the test).    FOLLOW UP: Our staff will call the number listed on your records the next business day following your procedure to check on you and address any questions or concerns that you may have regarding the information given to you following your procedure. If we do not reach you, we will leave a message.  However, if you are feeling well and you are not experiencing any problems, there is no need to return our call.  We will assume that you have returned to your regular daily activities without incident.  If any biopsies were taken you will be contacted by phone or by letter within the next 1-3 weeks.  Please call us at [phone] if you have not heard about the biopsies in 3 weeks.    SIGNATURES/CONFIDENTIALITY: You and/or your care partner have signed paperwork which will be entered into your electronic medical record.  These signatures attest to the fact that that the information above on your After Visit Summary has been reviewed and is understood.  Full responsibility of the confidentiality of this discharge information lies with you and/or your care-partner.

## 2018-02-10 NOTE — Progress Notes (Signed)
/  to PACU, VSS. Report to [email or web address]

## 2018-02-10 NOTE — Op Note (Signed)
[facility] Patient Name: Justin Benson Procedure Date: 02/10/2018 10:13 AM MRN: [id number] Endoscopist: Milus Banister , MD Age: 83 Referring MD:  Date of Birth: 10-06-1934 Gender: Male Account #: [id number] Procedure:                Upper GI endoscopy Indications:              Iron deficiency anemia Medicines:                Monitored Anesthesia Care Procedure:                Pre-Anesthesia Assessment:                           - Prior to the procedure, a History and Physical                            was performed, and patient medications and                            allergies were reviewed. The patient's tolerance of                            previous anesthesia was also reviewed. The risks                            and benefits of the procedure and the sedation                            options and risks were discussed with the patient.                            All questions were answered, and informed consent                            was obtained. Prior Anticoagulants: The patient has                            taken no previous anticoagulant or antiplatelet                            agents. ASA Grade Assessment: III - A patient with                            severe systemic disease. After reviewing the risks                            and benefits, the patient was deemed in                            satisfactory condition to undergo the procedure.                           After obtaining informed consent, the endoscope was  passed under direct vision. Throughout the                            procedure, the patient's blood pressure, pulse, and                            oxygen saturations were monitored continuously. The                            Model GIF-HQ190 [id number]) scope was introduced                            through the mouth, and advanced to the second part                            of duodenum. The upper GI  endoscopy was                            accomplished without difficulty. The patient                            tolerated the procedure well. Scope In: Scope Out: Findings:                 Soft, slightly nodular, friable mucosa in the                            proximal duodenal bulb. This is not overtly                            neoplastic appearing, biopsies taken and sent to                            pathology.                           Mild inflammation characterized by erythema,                            friability and granularity was found in the gastric                            antrum. Biopsies were taken with a cold forceps for                            histology.                           The exam was otherwise without abnormality. Complications:            No immediate complications. Estimated blood loss:                            None. Estimated Blood Loss:     Estimated blood loss: none. Impression:               - Justin Benson  mucosa in the duodenum, biopsied                           - Mild, non-specific gastritis, biopsied.                           - The examination was otherwise normal. Recommendation:           - Patient has a contact number available for                            emergencies. The signs and symptoms of potential                            delayed complications were discussed with the                            patient. Return to normal activities tomorrow.                            Written discharge instructions were provided to the                            patient.                           - Resume previous diet.                           - Continue present medications.                           - Await pathology results. Milus Banister, MD 02/10/2018 10:45:39 AM This report has been signed electronically.

## 2018-02-11 ENCOUNTER — Telehealth: Payer: Self-pay

## 2018-02-11 NOTE — Telephone Encounter (Signed)
  Follow up Call-  Call back number 02/10/2018  Post procedure Call Back phone  # [phone]  Permission to leave phone message Yes  Some recent data might be hidden     Patient questions:  Do you have a fever, pain , or abdominal swelling? No. Pain Score  0 *  Have you tolerated food without any problems? Yes.    Have you been able to return to your normal activities? Yes.    Do you have any questions about your discharge instructions: Diet   No. Medications  No. Follow up visit  No.  Do you have questions or concerns about your Care? No.  Actions: * If pain score is 4 or above: No action needed, pain <4.

## 2018-02-20 ENCOUNTER — Other Ambulatory Visit: Payer: Self-pay

## 2018-02-20 DIAGNOSIS — D509 Iron deficiency anemia, unspecified: Secondary | ICD-10-CM

## 2018-02-25 ENCOUNTER — Ambulatory Visit: Payer: Medicare Other | Admitting: Family Medicine

## 2018-02-26 ENCOUNTER — Encounter: Payer: Self-pay | Admitting: Family Medicine

## 2018-02-26 ENCOUNTER — Ambulatory Visit (INDEPENDENT_AMBULATORY_CARE_PROVIDER_SITE_OTHER): Payer: Medicare Other | Admitting: Family Medicine

## 2018-02-26 VITALS — BP 140/64 | HR 65 | Temp 98.2°F | Ht 67.0 in | Wt 132.2 lb

## 2018-02-26 DIAGNOSIS — F1010 Alcohol abuse, uncomplicated: Secondary | ICD-10-CM

## 2018-02-26 DIAGNOSIS — R634 Abnormal weight loss: Secondary | ICD-10-CM | POA: Diagnosis not present

## 2018-02-26 DIAGNOSIS — D500 Iron deficiency anemia secondary to blood loss (chronic): Secondary | ICD-10-CM

## 2018-02-26 DIAGNOSIS — I700 Atherosclerosis of aorta: Secondary | ICD-10-CM

## 2018-02-26 DIAGNOSIS — E1169 Type 2 diabetes mellitus with other specified complication: Secondary | ICD-10-CM

## 2018-02-26 DIAGNOSIS — F331 Major depressive disorder, recurrent, moderate: Secondary | ICD-10-CM | POA: Diagnosis not present

## 2018-02-26 LAB — POCT GLYCOSYLATED HEMOGLOBIN (HGB A1C): Hemoglobin A1C: 6.3 % — AB (ref 4.0–5.6)

## 2018-02-26 NOTE — Assessment & Plan Note (Signed)
Chronic, stable on metformin 500mg  bid. A1c showing great control.

## 2018-02-26 NOTE — Assessment & Plan Note (Signed)
Continue statin. Aspirin on hold with recent IDA.

## 2018-02-26 NOTE — Assessment & Plan Note (Signed)
Attributed to gastritis and duodenitis from chronic alcohol use. Recent colonoscopy/EGD reviewed with patient. He has GI f/u planned for next month. Encouraged continued full alcohol cessation. Will continue ferrous sulfate for now.

## 2018-02-26 NOTE — Assessment & Plan Note (Signed)
Continue sertraline.  100 mg daily  

## 2018-02-26 NOTE — Assessment & Plan Note (Signed)
Endorses full alcohol cessation since last week.  Congratulated, encouraged practical approaches to remain abstinent.

## 2018-02-26 NOTE — Assessment & Plan Note (Signed)
Ongoing. Anticipate related to alcoholism as well as recent dental work limiting diet choices.

## 2018-02-26 NOTE — Patient Instructions (Addendum)
Congratulations on quitting alcohol! Stay abstinent.  Continue current medicines.  Keep appointment with Dr [name] in September.  Return to see me in 3 months.

## 2018-02-26 NOTE — Progress Notes (Signed)
BP 140/64 (BP Location: Right Arm, Patient Position: Sitting, Cuff Size: Normal)   Pulse 65   Temp 98.2 F (36.8 C) (Oral)   Ht 5\' 7"  (1.702 m)   Wt 132 lb 4 oz (60 kg)   SpO2 97%   BMI 20.71 kg/m    CC: 3 mo f/u visit Subjective:    Patient ID: Justin Benson, male    DOB: [birth year], 83 y.o.   MRN: [id number]  HPI: Justin Benson is a 83 y.o. male presenting on 02/26/2018 for 3 mo follow up   See prior note for details. Alcoholism - Last drink he had was 1 wk ago. Planning to fully quit. States he has recently been able to work through stressful family issue. Weight loss noted.   New IDA. Referred to GI s/p colonoscopy/EGD revealing tubular adenoma of colon, gastritis and friable duodenum s/p benign biopsies. Blood loss thought from alcohol use. Planned GI f/u for September. Continues taking ferrous sulfate daily. Last visit we stopped aspirin.  Upcoming dental work - implants planned in 2 wks. Bone loss led to losing molars.   He completed shingrix series yesterday.   DM - does regularly check sugars mildly elevated 120 fasting. Compliant with antihyperglycemic regimen which includes: metformin 500mg  bid. Denies low sugars or hypoglycemic symptoms. Denies paresthesias. Last diabetic eye exam last summer. Pneumovax: 2004. Prevnar: 2015. Glucometer brand: one touch. DSME: has completed remotely. Lab Results  Component Value Date   HGBA1C 6.3 (A) 02/26/2018   Diabetic Foot Exam - Simple   Simple Foot Form Diabetic Foot exam was performed with the following findings:  Yes 02/26/2018  1:07 PM  Visual Inspection No deformities, no ulcerations, no other skin breakdown bilaterally:  Yes Sensation Testing Intact to touch and monofilament testing bilaterally:  Yes Pulse Check Posterior Tibialis and Dorsalis pulse intact bilaterally:  Yes Comments    Lab Results  Component Value Date   MICROALBUR <0.7 06/02/2015     Relevant past medical, surgical, family and social history  reviewed and updated as indicated. Interim medical history since our last visit reviewed. Allergies and medications reviewed and updated. Outpatient Medications Prior to Visit  Medication Sig Dispense Refill  . amLODipine (NORVASC) 10 MG tablet Take 1 tablet (10 mg total) by mouth daily. 90 tablet 3  . atorvastatin (LIPITOR) 10 MG tablet Take 0.5 tablets (5 mg total) by mouth daily at 6 PM. 45 tablet 3  . ferrous sulfate 325 (65 FE) MG tablet Take 1 tablet (325 mg total) by mouth daily with breakfast.  3  . finasteride (PROSCAR) 5 MG tablet Take 1 tablet (5 mg total) by mouth every evening. 90 tablet 3  . metFORMIN (GLUCOPHAGE) 500 MG tablet TAKE 1 TABLET BY MOUTH TWO  TIMES DAILY WITH MEALS 180 tablet 3  . Multiple Vitamin (MULTIVITAMIN WITH MINERALS) TABS Take 1 tablet by mouth daily.    Marland Kitchen omeprazole (PRILOSEC) 40 MG capsule Take 40 mg by mouth daily.    . quinapril (ACCUPRIL) 20 MG tablet Take 1 tablet (20 mg total) by mouth at bedtime. 90 tablet 3  . quinapril-hydrochlorothiazide (ACCURETIC) 20-25 MG tablet Take 1 tablet by mouth daily. 90 tablet 3  . sertraline (ZOLOFT) 100 MG tablet Take 1 tablet (100 mg total) by mouth daily. 90 tablet 3   Facility-Administered Medications Prior to Visit  Medication Dose Route Frequency Provider Last Rate Last Dose  . 0.9 %  sodium chloride infusion  500 mL Intravenous Once Milus Banister,  MD         Per HPI unless specifically indicated in ROS section below Review of Systems     Objective:    BP 140/64 (BP Location: Right Arm, Patient Position: Sitting, Cuff Size: Normal)   Pulse 65   Temp 98.2 F (36.8 C) (Oral)   Ht 5\' 7"  (1.702 m)   Wt 132 lb 4 oz (60 kg)   SpO2 97%   BMI 20.71 kg/m   Wt Readings from Last 3 Encounters:  02/26/18 132 lb 4 oz (60 kg)  02/10/18 137 lb (62.1 kg)  11/26/17 137 lb 6 oz (62.3 kg)    Physical Exam  Constitutional: He appears well-developed and well-nourished. No distress.  HENT:  Head: Normocephalic  and atraumatic.  Right Ear: External ear normal.  Left Ear: External ear normal.  Nose: Nose normal.  Mouth/Throat: Oropharynx is clear and moist. No oropharyngeal exudate.  Eyes: Pupils are equal, round, and reactive to light. Conjunctivae and EOM are normal. No scleral icterus.  Neck: Normal range of motion. Neck supple.  Cardiovascular: Normal rate, regular rhythm, normal heart sounds and intact distal pulses.  No murmur heard. Pulmonary/Chest: Effort normal and breath sounds normal. No respiratory distress. He has no wheezes. He has no rales.  Musculoskeletal: He exhibits no edema.  See HPI for foot exam if done  Lymphadenopathy:    He has no cervical adenopathy.  Skin: Skin is warm and dry. No rash noted.  Psychiatric: He has a normal mood and affect.  Nursing note and vitals reviewed.  Results for orders placed or performed in visit on 02/26/18  POCT glycosylated hemoglobin (Hb A1C)  Result Value Ref Range   Hemoglobin A1C 6.3 (A) 4.0 - 5.6 %   HbA1c POC (<> result, manual entry)  4.0 - 5.6 %   HbA1c, POC (prediabetic range)  5.7 - 6.4 %   HbA1c, POC (controlled diabetic range)  0.0 - 7.0 %   Lab Results  Component Value Date   TSH 4.20 11/25/2017   T3TOTAL 96 08/28/2017      Assessment & Plan:   Problem List Items Addressed This Visit    Weight loss    Ongoing. Anticipate related to alcoholism as well as recent dental work limiting diet choices.       MDD (major depressive disorder), recurrent episode, moderate (HCC)    Continue sertraline 100mg  daily.       Iron deficiency anemia    Attributed to gastritis and duodenitis from chronic alcohol use. Recent colonoscopy/EGD reviewed with patient. He has GI f/u planned for next month. Encouraged continued full alcohol cessation. Will continue ferrous sulfate for now.       Diabetes type 2, controlled ([place]) - Primary    Chronic, stable on metformin 500mg  bid. A1c showing great control.       Relevant Orders   POCT  glycosylated hemoglobin (Hb A1C) (Completed)   Alcohol abuse    Endorses full alcohol cessation since last week.  Congratulated, encouraged practical approaches to remain abstinent.       Abdominal aortic atherosclerosis (HCC)    Continue statin. Aspirin on hold with recent IDA.           No orders of the defined types were placed in this encounter.  Orders Placed This Encounter  Procedures  . POCT glycosylated hemoglobin (Hb A1C)    Follow up plan: Return in about 3 months (around 05/29/2018) for follow up visit.  [name], MD

## 2018-03-05 ENCOUNTER — Encounter: Payer: Self-pay | Admitting: Gastroenterology

## 2018-03-07 ENCOUNTER — Encounter: Payer: Self-pay | Admitting: Family Medicine

## 2018-03-11 ENCOUNTER — Telehealth: Payer: Self-pay | Admitting: Gastroenterology

## 2018-03-11 NOTE — Telephone Encounter (Signed)
The pt has been advised that the follow up is for his IDA and he will need to have labs prior.  He did agree and verbalized understanding

## 2018-03-23 LAB — HM DIABETES EYE EXAM

## 2018-04-22 ENCOUNTER — Other Ambulatory Visit (INDEPENDENT_AMBULATORY_CARE_PROVIDER_SITE_OTHER): Payer: Medicare Other

## 2018-04-22 DIAGNOSIS — D509 Iron deficiency anemia, unspecified: Secondary | ICD-10-CM

## 2018-04-22 LAB — CBC WITH DIFFERENTIAL/PLATELET
BASOS ABS: 0 10*3/uL (ref 0.0–0.1)
BASOS PCT: 0.7 % (ref 0.0–3.0)
EOS ABS: 0.2 10*3/uL (ref 0.0–0.7)
Eosinophils Relative: 3.3 % (ref 0.0–5.0)
HEMATOCRIT: 38.1 % — AB (ref 39.0–52.0)
Hemoglobin: 13.2 g/dL (ref 13.0–17.0)
LYMPHS PCT: 26.3 % (ref 12.0–46.0)
Lymphs Abs: 1.2 10*3/uL (ref 0.7–4.0)
MCHC: 34.5 g/dL (ref 30.0–36.0)
MCV: 92.1 fl (ref 78.0–100.0)
MONO ABS: 0.7 10*3/uL (ref 0.1–1.0)
Monocytes Relative: 13.9 % — ABNORMAL HIGH (ref 3.0–12.0)
NEUTROS ABS: 2.6 10*3/uL (ref 1.4–7.7)
Neutrophils Relative %: 55.8 % (ref 43.0–77.0)
PLATELETS: 201 10*3/uL (ref 150.0–400.0)
RBC: 4.14 Mil/uL — ABNORMAL LOW (ref 4.22–5.81)
RDW: 14.1 % (ref 11.5–15.5)
WBC: 4.7 10*3/uL (ref 4.0–10.5)

## 2018-05-08 ENCOUNTER — Encounter: Payer: Self-pay | Admitting: Gastroenterology

## 2018-05-08 ENCOUNTER — Ambulatory Visit (INDEPENDENT_AMBULATORY_CARE_PROVIDER_SITE_OTHER): Payer: Medicare Other | Admitting: Gastroenterology

## 2018-05-08 VITALS — BP 144/56 | HR 92 | Ht 67.0 in | Wt 134.2 lb

## 2018-05-08 DIAGNOSIS — D509 Iron deficiency anemia, unspecified: Secondary | ICD-10-CM | POA: Diagnosis not present

## 2018-05-08 NOTE — Progress Notes (Signed)
Review of pertinent gastrointestinal problems: 1. IDA;  4/30/2019labs show very good improvement after iron supplements CBC with a hemoglobin of 12.4 (9.4 08/28/2017), MCV normal (78.8 08/28/2017), ferritin normal at 28 (10.73 months ago).  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 pylori negative. 2. History of adenomatous poylp: 02/28/2014 colonoscopy, history of subcentimeter TA removed 2010 by Dr. [name]; finding of one small polyp in several diverticulum in the left colon otherwise normal.  Pathology showed benign polyp.  No recall colonoscopy was recommended (age).  01/2018 Colonoscopy single 63mm TA     HPI: This is an very pleasant 83 year old man whom I last saw around the time of upper endoscopy and colonoscopy about 3 months ago.  See those results summarized above.  04/27/18 CBC Hb 13s  He really feels well.  He was out digging a stump in his yard all day today.  He eats well.  He is back to drinking about 3 beers per day.  At his most he will drink  4 or 5.  No changes in his bowels, no overt bleeding of any kind.  No abdominal pains.  Chief complaint is iron deficiency anemia  ROS: complete GI ROS as described in HPI, all other review negative.  Constitutional:  No unintentional weight loss   Past Medical History:  Diagnosis Date  . AAA (abdominal aortic aneurysm) ([place]) 05/2012   mild 2.3 cm , rpt 1 year  . Alcoholic hepatitis without ascites 06/02/2014   Normal viral hep panel, normal abd [place] (2015)  . Anemia   . Anxiety   . Arthritis    fingers and hip  . BPH (benign prostatic hypertrophy)   . Choledocholithiasis 11/2012   s/p ERCP, s/p lap chole  . COPD (chronic obstructive pulmonary disease) ([place])   . Depression   . Diabetes mellitus 4/02  . GERD (gastroesophageal reflux disease)   . Hyperlipidemia   . Hypertension 1996  . Pneumonia as a baby  . Radial nerve palsy  06/02/2014  . Schwannoma of spinal cord ([place]) 11/2012, 09/2015   medial to R psoas muscle, 4cm size  . 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 Substance abuse ([place])    I probably drink too much, dr, told me to cut down    Past Surgical History:  Procedure Laterality Date  . BASAL CELL CARCINOMA EXCISION Right 05/2017   right ear; Dr. Elvera Lennox, [facility]  . CHOLECYSTECTOMY N/A 03/08/2013   Procedure: LAPAROSCOPIC CHOLECYSTECTOMY WITH INTRAOPERATIVE CHOLANGIOGRAM;  Surgeon: Harl Bowie, MD;  Location: [facility];  Service: General;  Laterality: N/A;  . CIRCUMCISION  10/30/09   Dr Gaynelle Arabian  . COLONOSCOPY  11/2008   2 small polyps (1 tubular), diverticulosis, lipoma, int/ext hemorrhoids [name])  . COLONOSCOPY  02/2014   1 polyp, diverticulosis, no rpt needed [name])  . COLONOSCOPY  01/2018   1 TA [name])  . ENDOSCOPIC RETROGRADE CHOLANGIOPANCREATOGRAPHY (ERCP) WITH PROPOFOL N/A 12/24/2012   Procedure: ENDOSCOPIC RETROGRADE CHOLANGIOPANCREATOGRAPHY (ERCP) WITH PROPOFOL;  Surgeon: Milus Banister, MD;  Location: [facility];  Service: Endoscopy;  Laterality: N/A;  . ESOPHAGOGASTRODUODENOSCOPY  01/2018   friable duodenal mucosa, mild gastritis s/p benign biopsies [name])  . LAPAROSCOPIC CHOLECYSTECTOMY  02/2013   Dr. Ninfa Linden    Current Outpatient Medications  Medication Sig Dispense Refill  . amLODipine (NORVASC) 10 MG tablet Take 1 tablet (10 mg total) by  mouth daily. 90 tablet 3  . atorvastatin (LIPITOR) 10 MG tablet Take 0.5 tablets (5 mg total) by mouth daily at 6 PM. 45 tablet 3  . ferrous sulfate 325 (65 FE) MG tablet Take 1 tablet (325 mg total) by mouth daily with breakfast.  3  . finasteride (PROSCAR) 5 MG tablet Take 1 tablet (5 mg total) by mouth every evening. 90 tablet 3  . metFORMIN (GLUCOPHAGE) 500 MG tablet TAKE 1 TABLET BY MOUTH TWO  TIMES DAILY WITH MEALS 180 tablet 3  . Multiple Vitamin (MULTIVITAMIN WITH MINERALS) TABS Take 1 tablet  by mouth daily.    Marland Kitchen omeprazole (PRILOSEC) 40 MG capsule Take 40 mg by mouth daily.    . quinapril (ACCUPRIL) 20 MG tablet Take 1 tablet (20 mg total) by mouth at bedtime. 90 tablet 3  . quinapril-hydrochlorothiazide (ACCURETIC) 20-25 MG tablet Take 1 tablet by mouth daily. 90 tablet 3  . sertraline (ZOLOFT) 100 MG tablet Take 1 tablet (100 mg total) by mouth daily. 90 tablet 3   No current facility-administered medications for this visit.     Allergies as of 05/08/2018  . (No Known Allergies)    Family History  Problem Relation Age of Onset  . Prostate cancer Father   . Aneurysm Father        aortic  . Stroke Father   . Cancer Sister        bladder (nonsmoker)  . Hodgkin's lymphoma Brother 66  . Prostate cancer Brother 38       1/2  . Prostate cancer Brother 27       1/2  . Other Sister        cerebral hemm  . Lupus Sister   . Diabetes Brother   . Colon cancer Neg Hx     Social History   Socioeconomic History  . Marital status: Single    Spouse name: Not on file  . Number of children: 1  . Years of education: Not on file  . Highest education level: Not on file  Occupational History  . Occupation: Chartered certified accountant: retired    Comment: retired 1999  Social Needs  . Financial resource strain: Not on file  . Food insecurity:    Worry: Not on file    Inability: Not on file  . Transportation needs:    Medical: Not on file    Non-medical: Not on file  Tobacco Use  . Smoking status: Former Smoker    Packs/day: 2.00    Years: 30.00    Pack years: 60.00    Types: Cigarettes    Start date: 07/29/1962    Last attempt to quit: 07/29/1992    Years since quitting: 25.7  . Smokeless tobacco: Never Used  Substance and Sexual Activity  . Alcohol use: Yes    Alcohol/week: 3.0 standard drinks    Types: 3 Cans of beer per week    Comment: 3 cans of beer daily  . Drug use: No  . Sexual activity: Yes  Lifestyle  . Physical activity:    Days per week: Not on  file    Minutes per session: Not on file  . Stress: Not on file  Relationships  . Social connections:    Talks on phone: Not on file    Gets together: Not on file    Attends religious service: Not on file    Active member of club or organization: Not on file  Attends meetings of clubs or organizations: Not on file    Relationship status: Not on file  . Intimate partner violence:    Fear of current or ex partner: Not on file    Emotionally abused: Not on file    Physically abused: Not on file    Forced sexual activity: Not on file  Other Topics Concern  . 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     Physical Exam: BP (!) 144/56   Pulse 92   Ht 5\' 7"  (1.702 m)   Wt 134 lb 4 oz (60.9 kg)   BMI 21.03 kg/m  Constitutional: generally well-appearing Psychiatric: alert and oriented x3 Abdomen: soft, nontender, nondistended, no obvious ascites, no peritoneal signs, normal bowel sounds No peripheral edema noted in lower extremities  Assessment and plan: 84 y.o. male with iron deficiency anemia  Iron supplements have completely resolved his anemia.  He has no GI symptoms and upper and lower endoscopies recently were fairly normal.  I see no reason for any further GI testing from my standpoint.  He does cough he has any further questions or concerns.  Please see the "Patient Instructions" section for addition details about the plan.  Owens Loffler, MD [facility] 05/08/2018, 3:31 PM

## 2018-05-08 NOTE — Patient Instructions (Signed)
Thank you for entrusting me with your care and choosing [facility].  Dr [name]

## 2018-05-30 ENCOUNTER — Other Ambulatory Visit: Payer: Self-pay | Admitting: Family Medicine

## 2018-06-03 ENCOUNTER — Encounter: Payer: Self-pay | Admitting: Family Medicine

## 2018-06-03 ENCOUNTER — Ambulatory Visit (INDEPENDENT_AMBULATORY_CARE_PROVIDER_SITE_OTHER): Payer: Medicare Other | Admitting: Family Medicine

## 2018-06-03 VITALS — BP 136/64 | HR 65 | Temp 97.8°F | Ht 67.0 in | Wt 133.2 lb

## 2018-06-03 DIAGNOSIS — D500 Iron deficiency anemia secondary to blood loss (chronic): Secondary | ICD-10-CM | POA: Diagnosis not present

## 2018-06-03 DIAGNOSIS — F1010 Alcohol abuse, uncomplicated: Secondary | ICD-10-CM | POA: Diagnosis not present

## 2018-06-03 DIAGNOSIS — R634 Abnormal weight loss: Secondary | ICD-10-CM | POA: Diagnosis not present

## 2018-06-03 DIAGNOSIS — E1169 Type 2 diabetes mellitus with other specified complication: Secondary | ICD-10-CM | POA: Diagnosis not present

## 2018-06-03 LAB — CBC WITH DIFFERENTIAL/PLATELET
BASOS PCT: 0.9 % (ref 0.0–3.0)
Basophils Absolute: 0 10*3/uL (ref 0.0–0.1)
EOS PCT: 1.5 % (ref 0.0–5.0)
Eosinophils Absolute: 0.1 10*3/uL (ref 0.0–0.7)
HEMATOCRIT: 39.7 % (ref 39.0–52.0)
Hemoglobin: 13.6 g/dL (ref 13.0–17.0)
LYMPHS PCT: 23.3 % (ref 12.0–46.0)
Lymphs Abs: 1 10*3/uL (ref 0.7–4.0)
MCHC: 34.4 g/dL (ref 30.0–36.0)
MCV: 92.8 fl (ref 78.0–100.0)
MONOS PCT: 10.5 % (ref 3.0–12.0)
Monocytes Absolute: 0.4 10*3/uL (ref 0.1–1.0)
NEUTROS ABS: 2.6 10*3/uL (ref 1.4–7.7)
Neutrophils Relative %: 63.8 % (ref 43.0–77.0)
PLATELETS: 221 10*3/uL (ref 150.0–400.0)
RBC: 4.28 Mil/uL (ref 4.22–5.81)
RDW: 14.1 % (ref 11.5–15.5)
WBC: 4.1 10*3/uL (ref 4.0–10.5)

## 2018-06-03 LAB — IBC PANEL
Iron: 73 ug/dL (ref 42–165)
Saturation Ratios: 17.1 % — ABNORMAL LOW (ref 20.0–50.0)
TRANSFERRIN: 305 mg/dL (ref 212.0–360.0)

## 2018-06-03 LAB — FERRITIN: Ferritin: 56.6 ng/mL (ref 22.0–322.0)

## 2018-06-03 MED ORDER — METFORMIN HCL ER 500 MG PO TB24: 1000.0000 mg | ORAL_TABLET | Freq: Every day | ORAL | 5 refills | Status: DC

## 2018-06-03 MED ORDER — FERROUS SULFATE 325 (65 FE) MG PO TABS: 325.0000 mg | ORAL_TABLET | ORAL | 3 refills | Status: DC

## 2018-06-03 NOTE — Progress Notes (Signed)
BP 136/64 (BP Location: Left Arm, Patient Position: Sitting, Cuff Size: Normal)   Pulse 65   Temp 97.8 F (36.6 C) (Oral)   Ht 5\' 7"  (1.702 m)   Wt 133 lb 4 oz (60.4 kg)   SpO2 98%   BMI 20.87 kg/m    CC: 3 mo f/u visit Subjective:    Patient ID: Justin Benson, male    DOB: [birth year], 83 y.o.   MRN: [id number]  HPI: Justin Benson is a 83 y.o. male presenting on 06/03/2018 for Follow-up (Here for 3 mo f/u.)   See prior note for details.  Alcoholism - stopped drinking 01/2018, restarted 5 wks later and now drinking some days up to 4 cans of beer/day.   IDA - saw GI s/p colonoscopy/EGD revealing tubular adenoma of colon, gastritis and friable duodenum s/p benign biopsies. Blood loss thought from alcohol use. Continues ferrous sulfate.   Recently saw dermatologist - with several lesions removed on face.   At times notes trouble controlling bowels with occasional accidents - wonders if metformin could be contributing.   He did see eye doctor with stable exam.  Relevant past medical, surgical, family and social history reviewed and updated as indicated. Interim medical history since our last visit reviewed. Allergies and medications reviewed and updated. Outpatient Medications Prior to Visit  Medication Sig Dispense Refill  . amLODipine (NORVASC) 10 MG tablet TAKE 1 TABLET BY MOUTH  DAILY 90 tablet 0  . atorvastatin (LIPITOR) 10 MG tablet TAKE ONE-HALF TABLET BY  MOUTH DAILY AT 6 PM. 45 tablet 0  . finasteride (PROSCAR) 5 MG tablet TAKE 1 TABLET BY MOUTH  EVERY EVENING 90 tablet 0  . metFORMIN (GLUCOPHAGE) 500 MG tablet TAKE 1 TABLET BY MOUTH TWO  TIMES DAILY WITH MEALS 180 tablet 0  . Multiple Vitamin (MULTIVITAMIN WITH MINERALS) TABS Take 1 tablet by mouth daily.    Marland Kitchen omeprazole (PRILOSEC) 40 MG capsule Take 40 mg by mouth daily.    . quinapril (ACCUPRIL) 20 MG tablet TAKE 1 TABLET BY MOUTH AT  BEDTIME 90 tablet 0  . quinapril-hydrochlorothiazide (ACCURETIC) 20-25 MG tablet  TAKE 1 TABLET BY MOUTH  DAILY 90 tablet 0  . sertraline (ZOLOFT) 100 MG tablet TAKE 1 TABLET BY MOUTH  DAILY 90 tablet 0  . ferrous sulfate 325 (65 FE) MG tablet Take 1 tablet (325 mg total) by mouth daily with breakfast.  3   No facility-administered medications prior to visit.      Per HPI unless specifically indicated in ROS section below Review of Systems     Objective:    BP 136/64 (BP Location: Left Arm, Patient Position: Sitting, Cuff Size: Normal)   Pulse 65   Temp 97.8 F (36.6 C) (Oral)   Ht 5\' 7"  (1.702 m)   Wt 133 lb 4 oz (60.4 kg)   SpO2 98%   BMI 20.87 kg/m   Wt Readings from Last 3 Encounters:  06/03/18 133 lb 4 oz (60.4 kg)  05/08/18 134 lb 4 oz (60.9 kg)  02/26/18 132 lb 4 oz (60 kg)    Physical Exam  Constitutional: He appears well-developed and well-nourished. No distress.  HENT:  Head: Normocephalic and atraumatic.  Right Ear: External ear normal.  Left Ear: External ear normal.  Nose: Nose normal.  Mouth/Throat: Oropharynx is clear and moist. No oropharyngeal exudate.  Eyes: Pupils are equal, round, and reactive to light. Conjunctivae and EOM are normal. No scleral icterus.  Neck: Normal range of  motion. Neck supple.  Cardiovascular: Normal rate, regular rhythm, normal heart sounds and intact distal pulses.  No murmur heard. Pulmonary/Chest: Effort normal and breath sounds normal. No respiratory distress. He has no wheezes. He has no rales.  Musculoskeletal: He exhibits no edema.  See HPI for foot exam if done  Lymphadenopathy:    He has no cervical adenopathy.  Skin: Skin is warm and dry. No rash noted.  Psychiatric: He has a normal mood and affect.  Nursing note and vitals reviewed.  Results for orders placed or performed in visit on 06/03/18  HM DIABETES EYE EXAM  Result Value Ref Range   HM Diabetic Eye Exam No Retinopathy No Retinopathy   Lab Results  Component Value Date   IRON 30 (L) 08/28/2017   FERRITIN 28.0 11/25/2017    Lab  Results  Component Value Date   HGBA1C 6.3 (A) 02/26/2018       Assessment & Plan:   Problem List Items Addressed This Visit    Weight loss    This has stabilized.       Iron deficiency anemia - Primary    Update iron levels today, start oral iron replacement every other day and recheck at physical.       Relevant Medications   ferrous sulfate 325 (65 FE) MG tablet   Other Relevant Orders   IBC panel   Ferritin   CBC with Differential/Platelet   Diabetes type 2, controlled (HCC)    Chronic, stable. Trial metformin XR 1000mg  daily due to endorsed stool urgency issues.       Relevant Medications   metFORMIN (GLUCOPHAGE XR) 500 MG 24 hr tablet   Alcohol abuse    Intermittent alcohol use. Encouraged full avoidance of alcohol given GI hx.           Meds ordered this encounter  Medications  . ferrous sulfate 325 (65 FE) MG tablet    Sig: Take 1 tablet (325 mg total) by mouth every other day.    Refill:  3  . metFORMIN (GLUCOPHAGE XR) 500 MG 24 hr tablet    Sig: Take 2 tablets (1,000 mg total) by mouth daily with breakfast.    Dispense:  60 tablet    Refill:  5   Orders Placed This Encounter  Procedures  . IBC panel  . Ferritin  . CBC with Differential/Platelet  . [facility] DIABETES EYE EXAM    This external order was created through the Results Console.    Follow up plan: No follow-ups on file.  [name], MD

## 2018-06-03 NOTE — Assessment & Plan Note (Signed)
This has stabilized.  

## 2018-06-03 NOTE — Assessment & Plan Note (Signed)
Update iron levels today, start oral iron replacement every other day and recheck at physical.

## 2018-06-03 NOTE — Patient Instructions (Addendum)
Labs today Try extended release metformin and monitor bowel issues on this medicine. Let me know if persistent trouble.  Sign release of records for diabetic eye exam up front

## 2018-06-03 NOTE — Assessment & Plan Note (Signed)
Chronic, stable. Trial metformin XR 1000mg  daily due to endorsed stool urgency issues.

## 2018-06-03 NOTE — Assessment & Plan Note (Addendum)
Intermittent alcohol use. Encouraged full avoidance of alcohol given GI hx.

## 2018-06-08 ENCOUNTER — Encounter: Payer: Self-pay | Admitting: Family Medicine

## 2018-08-14 ENCOUNTER — Other Ambulatory Visit: Payer: Self-pay | Admitting: Family Medicine

## 2018-08-27 ENCOUNTER — Other Ambulatory Visit: Payer: Self-pay | Admitting: Family Medicine

## 2018-08-27 DIAGNOSIS — D500 Iron deficiency anemia secondary to blood loss (chronic): Secondary | ICD-10-CM

## 2018-08-27 DIAGNOSIS — E039 Hypothyroidism, unspecified: Secondary | ICD-10-CM

## 2018-08-27 DIAGNOSIS — E785 Hyperlipidemia, unspecified: Secondary | ICD-10-CM

## 2018-08-27 DIAGNOSIS — E1169 Type 2 diabetes mellitus with other specified complication: Secondary | ICD-10-CM

## 2018-08-27 DIAGNOSIS — R946 Abnormal results of thyroid function studies: Secondary | ICD-10-CM

## 2018-08-27 DIAGNOSIS — R634 Abnormal weight loss: Secondary | ICD-10-CM

## 2018-08-27 DIAGNOSIS — N400 Enlarged prostate without lower urinary tract symptoms: Secondary | ICD-10-CM

## 2018-08-27 DIAGNOSIS — K7010 Alcoholic hepatitis without ascites: Secondary | ICD-10-CM

## 2018-08-28 ENCOUNTER — Ambulatory Visit (INDEPENDENT_AMBULATORY_CARE_PROVIDER_SITE_OTHER): Payer: Medicare Other

## 2018-08-28 ENCOUNTER — Ambulatory Visit: Payer: Medicare Other

## 2018-08-28 VITALS — BP 132/70 | HR 69 | Temp 97.6°F | Ht 68.0 in | Wt 134.5 lb

## 2018-08-28 DIAGNOSIS — Z0000 Encounter for general adult medical examination without abnormal findings: Secondary | ICD-10-CM

## 2018-08-28 DIAGNOSIS — N400 Enlarged prostate without lower urinary tract symptoms: Secondary | ICD-10-CM

## 2018-08-28 DIAGNOSIS — Z0001 Encounter for general adult medical examination with abnormal findings: Secondary | ICD-10-CM | POA: Diagnosis not present

## 2018-08-28 DIAGNOSIS — R946 Abnormal results of thyroid function studies: Secondary | ICD-10-CM | POA: Diagnosis not present

## 2018-08-28 DIAGNOSIS — E039 Hypothyroidism, unspecified: Secondary | ICD-10-CM

## 2018-08-28 DIAGNOSIS — Z1331 Encounter for screening for depression: Secondary | ICD-10-CM | POA: Diagnosis not present

## 2018-08-28 DIAGNOSIS — E1169 Type 2 diabetes mellitus with other specified complication: Secondary | ICD-10-CM

## 2018-08-28 DIAGNOSIS — E785 Hyperlipidemia, unspecified: Secondary | ICD-10-CM | POA: Diagnosis not present

## 2018-08-28 LAB — LIPID PANEL
Cholesterol: 144 mg/dL (ref 0–200)
HDL: 58.2 mg/dL (ref >39.00)
LDL Cholesterol: 70 mg/dL (ref 0–99)
NonHDL: 85.96
Total CHOL/HDL Ratio: 2
Triglycerides: 82 mg/dL (ref 0.0–149.0)
VLDL: 16.4 mg/dL (ref 0.0–40.0)

## 2018-08-28 LAB — COMPREHENSIVE METABOLIC PANEL
ALT: 159 U/L — ABNORMAL HIGH (ref 0–53)
AST: 201 U/L — ABNORMAL HIGH (ref 0–37)
Albumin: 4.4 g/dL (ref 3.5–5.2)
Alkaline Phosphatase: 129 U/L — ABNORMAL HIGH (ref 39–117)
BUN: 14 mg/dL (ref 6–23)
CO2: 33 mEq/L — ABNORMAL HIGH (ref 19–32)
Calcium: 9.6 mg/dL (ref 8.4–10.5)
Chloride: 98 mEq/L (ref 96–112)
Creatinine, Ser: 0.78 mg/dL (ref 0.40–1.50)
GFR: 95.48 mL/min (ref >60.00)
Glucose, Bld: 170 mg/dL — ABNORMAL HIGH (ref 70–99)
POTASSIUM: 4.5 meq/L (ref 3.5–5.1)
Sodium: 138 mEq/L (ref 135–145)
Total Bilirubin: 0.5 mg/dL (ref 0.2–1.2)
Total Protein: 6.5 g/dL (ref 6.0–8.3)

## 2018-08-28 LAB — HEMOGLOBIN A1C: Hgb A1c MFr Bld: 8.1 % — ABNORMAL HIGH (ref 4.6–6.5)

## 2018-08-28 LAB — TSH: TSH: 5.85 u[IU]/mL — ABNORMAL HIGH (ref 0.35–4.50)

## 2018-08-28 LAB — PSA: PSA: 0.51 ng/mL (ref 0.10–4.00)

## 2018-08-28 LAB — T4, FREE: Free T4: 0.94 ng/dL (ref 0.60–1.60)

## 2018-08-28 NOTE — Progress Notes (Signed)
Subjective:   Justin Benson is a 83 y.o. male who presents for Medicare Annual/Subsequent preventive examination.  Review of Systems:  N/A Cardiac Risk Factors include: advanced age (>79men, >44 women);diabetes mellitus;dyslipidemia;hypertension;male gender     Objective:    Vitals: BP 132/70 (BP Location: Right Arm, Patient Position: Sitting, Cuff Size: Normal)   Pulse 69   Temp 97.6 F (36.4 C) (Oral)   Ht 5\' 8"  (1.727 m)   Wt 134 lb 8 oz (61 kg)   SpO2 99%   BMI 20.45 kg/m   Body mass index is 20.45 kg/m.  Advanced Directives 08/28/2018 08/20/2017 06/10/2016 02/28/2014 02/26/2013 01/21/2013 12/16/2012  Does Patient Have a Medical Advance Directive? Yes Yes Yes Patient has advance directive, copy not in chart Patient has advance directive, copy not in chart Patient has advance directive, copy not in chart Patient has advance directive, copy not in chart  Type of Advance Directive [place];Living will [place];Living will [place];Living will - [place];Living will [place];Living will [place];Living will  Does patient want to make changes to medical advance directive? - - No - Patient declined - - - -  Copy of [place] in Chart? Yes - validated most recent copy scanned in chart (See row information) Yes Yes - Copy requested from family - Copy requested from family  Pre-existing out of facility DNR order (yellow form or pink MOST form) - - - - - No -    Tobacco Social History   Tobacco Use  Smoking Status Former Smoker  . Packs/day: 2.00  . Years: 30.00  . Pack years: 60.00  . Types: Cigarettes  . Start date: 07/29/1962  . Last attempt to quit: 07/29/1992  . Years since quitting: 26.0  Smokeless Tobacco Never Used     Counseling given: No   Clinical Intake:  Pre-visit preparation completed: Yes  Pain : No/denies pain Pain Score: 0-No  pain     Nutritional Status: BMI 25 -29 Overweight Nutritional Risks: None Diabetes: Yes CBG done?: No Did pt. bring in CBG monitor from home?: No  How often do you need to have someone help you when you read instructions, pamphlets, or other written materials from your doctor or pharmacy?: 1 - Never What is the last grade level you completed in school?: Master degree      Comments: pt lives alone Information entered by :: LPinson, LPN  Past Medical History:  Diagnosis Date  . AAA (abdominal aortic aneurysm) ([place]) 05/2012   mild 2.3 cm , rpt 1 year  . Alcoholic hepatitis without ascites 06/02/2014   Normal viral hep panel, normal abd [place] (2015)  . Anemia   . Anxiety   . Arthritis    fingers and hip  . BPH (benign prostatic hypertrophy)   . Choledocholithiasis 11/2012   s/p ERCP, s/p lap chole  . COPD (chronic obstructive pulmonary disease) ([place])   . Depression   . Diabetes mellitus 4/02  . GERD (gastroesophageal reflux disease)   . Hyperlipidemia   . Hypertension 1996  . Pneumonia as a baby  . 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 Schwannoma of spinal cord ([place]) 11/2012, 09/2015   medial to R psoas muscle, 4cm size  . 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 Substance abuse ([place])    I probably drink too much, dr, told me to cut down  Past Surgical History:  Procedure Laterality Date  . BASAL CELL CARCINOMA EXCISION Right 05/2017   right ear; Dr. Elvera Lennox, [facility]  . CHOLECYSTECTOMY N/A 03/08/2013   Procedure: LAPAROSCOPIC CHOLECYSTECTOMY WITH INTRAOPERATIVE CHOLANGIOGRAM;  Surgeon: Harl Bowie, MD;  Location: [facility];  Service: General;  Laterality: N/A;  . CIRCUMCISION  10/30/09   Dr Gaynelle Arabian  . COLONOSCOPY  11/2008   2 small polyps (1 tubular), diverticulosis, lipoma, int/ext hemorrhoids [name])  . COLONOSCOPY  02/2014   1 polyp, diverticulosis, no rpt needed [name])  . COLONOSCOPY  01/2018   1 TA [name])  . ENDOSCOPIC  RETROGRADE CHOLANGIOPANCREATOGRAPHY (ERCP) WITH PROPOFOL N/A 12/24/2012   Procedure: ENDOSCOPIC RETROGRADE CHOLANGIOPANCREATOGRAPHY (ERCP) WITH PROPOFOL;  Surgeon: Milus Banister, MD;  Location: [facility];  Service: Endoscopy;  Laterality: N/A;  . ESOPHAGOGASTRODUODENOSCOPY  01/2018   friable duodenal mucosa, mild gastritis s/p benign biopsies [name])  . LAPAROSCOPIC CHOLECYSTECTOMY  02/2013   Dr. Ninfa Linden   Family History  Problem Relation Age of Onset  . Prostate cancer Father   . Aneurysm Father        aortic  . Stroke Father   . Cancer Sister        bladder (nonsmoker)  . Hodgkin's lymphoma Brother 70  . Prostate cancer Brother 24       1/2  . Prostate cancer Brother 67       1/2  . Other Sister        cerebral hemm  . Lupus Sister   . Diabetes Brother   . Colon cancer Neg Hx    Social History   Socioeconomic History  . Marital status: Single    Spouse name: Not on file  . Number of children: 1  . Years of education: Not on file  . Highest education level: Not on file  Occupational History  . Occupation: Chartered certified accountant: retired    Comment: retired 1999  Social Needs  . Financial resource strain: Not on file  . Food insecurity:    Worry: Not on file    Inability: Not on file  . Transportation needs:    Medical: Not on file    Non-medical: Not on file  Tobacco Use  . Smoking status: Former Smoker    Packs/day: 2.00    Years: 30.00    Pack years: 60.00    Types: Cigarettes    Start date: 07/29/1962    Last attempt to quit: 07/29/1992    Years since quitting: 26.0  . Smokeless tobacco: Never Used  Substance and Sexual Activity  . Alcohol use: Yes    Alcohol/week: 3.0 standard drinks    Types: 3 Cans of beer per week    Comment: 3 cans of beer daily  . Drug use: No  . Sexual activity: Yes  Lifestyle  . Physical activity:    Days per week: Not on file    Minutes per session: Not on file  . Stress: Not on file  Relationships  . Social  connections:    Talks on phone: Not on file    Gets together: Not on file    Attends religious service: Not on file    Active member of club or organization: Not on file    Attends meetings of clubs or organizations: Not on file    Relationship status: Not on file  Other Topics Concern  . 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    Outpatient Encounter Medications as of 08/28/2018  Medication Sig  . amLODipine (NORVASC) 10 MG tablet TAKE 1 TABLET BY MOUTH  DAILY  . atorvastatin (LIPITOR) 10 MG tablet TAKE ONE-HALF TABLET BY  MOUTH DAILY AT 6 PM.  . ferrous sulfate 325 (65 FE) MG tablet Take 1 tablet (325 mg total) by mouth every other day.  . finasteride (PROSCAR) 5 MG tablet TAKE 1 TABLET BY MOUTH  EVERY EVENING  . metFORMIN (GLUCOPHAGE XR) 500 MG 24 hr tablet Take 2 tablets (1,000 mg total) by mouth daily with breakfast.  . metFORMIN (GLUCOPHAGE) 500 MG tablet TAKE 1 TABLET BY MOUTH TWO  TIMES DAILY WITH MEALS  . Multiple Vitamin (MULTIVITAMIN WITH MINERALS) TABS Take 1 tablet by mouth daily.  Marland Kitchen omeprazole (PRILOSEC) 40 MG capsule Take 40 mg by mouth daily.  . quinapril (ACCUPRIL) 20 MG tablet TAKE 1 TABLET BY MOUTH AT  BEDTIME  . quinapril-hydrochlorothiazide (ACCURETIC) 20-25 MG tablet TAKE 1 TABLET BY MOUTH  DAILY  . sertraline (ZOLOFT) 100 MG tablet TAKE 1 TABLET BY MOUTH  DAILY   No facility-administered encounter medications on file as of 08/28/2018.     Activities of Daily Living In your present state of health, do you have any difficulty performing the following activities: 08/28/2018  Hearing? N  Vision? N  Difficulty concentrating or making  decisions? N  Walking or climbing stairs? N  Dressing or bathing? N  Doing errands, shopping? N  Preparing Food and eating ? N  Using the Toilet? N  In the past six months, have you accidently leaked urine? N  Do you have problems with loss of bowel control? N  Managing your Medications? N  Managing your Finances? N  Housekeeping or managing your Housekeeping? N  Some recent data might be hidden    Patient Care Team: [name], MD as PCP - General (Family Medicine) Melida Quitter, MD as Consulting Physician (Otolaryngology) Harriett Sine, MD as Consulting Physician (Dermatology) Fanny Dance, DDS as Referring Physician (Dentistry)   Assessment:   This is a routine wellness examination for Brylin.   Hearing Screening   125Hz  250Hz  500Hz  1000Hz  2000Hz  3000Hz  4000Hz  6000Hz  8000Hz   Right ear:   40 40 40  0    Left ear:   40 40 40  0    Vision Screening Comments: Vision exam in 2019    Exercise Activities and Dietary recommendations Current Exercise Habits: The patient does not participate in regular exercise at present, Exercise limited by: None identified  Goals    . Follow up with Primary Care Provider     Starting 08/28/2018, I will continue to take medications as prescribed and to keep appointments with PCP as scheduled.        Fall Risk Fall Risk  08/28/2018 08/20/2017 06/10/2016 06/10/2016 06/07/2015  Falls in the past year? 0 No No No No  Comment - - - - -  Number falls in past yr: - - - - -  Injury with Fall? - - - - -  Comment - - - - -  Risk for fall due to : - - - - -  Risk for fall due to: Comment - - - - -  Depression Screen PHQ 2/9 Scores 08/28/2018 08/20/2017 06/10/2016 06/07/2015  PHQ - 2 Score 4 2 2 2   PHQ- 9 Score 16 4 6 2     Cognitive Function MMSE - Mini Mental State Exam 08/28/2018 08/20/2017 06/10/2016  Orientation to time 5 5 5   Orientation to Place 5 5 5   Registration 3 3 3   Attention/ Calculation 0 0 0  Recall 3 3 3   Language- name  2 objects 0 0 0  Language- repeat 1 1 1   Language- follow 3 step command 3 3 3   Language- read & follow direction 0 0 0  Write a sentence 0 0 0  Copy design 0 0 0  Total score 20 20 20      PLEASE NOTE: A Mini-Cog screen was completed. Maximum score is 20. A value of 0 denotes this part of [place] MMSE was not completed or the patient failed this part of the Mini-Cog screening.   Mini-Cog Screening Orientation to Time - Max 5 pts Orientation to Place - Max 5 pts Registration - Max 3 pts Recall - Max 3 pts Language Repeat - Max 1 pts Language Follow 3 Step Command - Max 3 pts     Immunization History  Administered Date(s) Administered  . Influenza Split 05/08/2011, 05/29/2012  . Influenza Whole 04/28/2006, 06/17/2007, 05/15/2008, 05/07/2010  . Influenza,inj,Quad PF,6+ Mos 06/02/2014, 06/07/2015  . Influenza,inj,quad, With Preservative 04/23/2017, 05/01/2018  . Influenza-Unspecified 05/17/2013, 04/15/2016  . Pneumococcal Conjugate-13 06/02/2014  . Pneumococcal Polysaccharide-23 06/29/2003  . Td 12/28/1998, 08/28/2009  . Zoster 04/03/2011  . Zoster Recombinat (Shingrix) 11/06/2017, 02/25/2018    Screening Tests Health Maintenance  Topic Date Due  . DTaP/Tdap/Td (1 - Tdap) 08/29/2019 (Originally 06/10/1946)  . HEMOGLOBIN A1C  02/26/2019  . FOOT EXAM  02/27/2019  . OPHTHALMOLOGY EXAM  03/24/2019  . TETANUS/TDAP  08/29/2019  . INFLUENZA VACCINE  Completed  . PNA vac Low Risk Adult  Completed       Plan:     I have personally reviewed, addressed, and noted the following in the patient's chart:  A. Medical and social history B. Use of alcohol, tobacco or illicit drugs  C. Current medications and supplements D. Functional ability and status E.  Nutritional status F.  Physical activity G. Advance directives H. List of other physicians I.  Hospitalizations, surgeries, and ER visits in previous 12 months J.  [place] to include hearing, vision, cognitive,  depression L. Referrals and appointments - none  In addition, I have reviewed and discussed with patient certain preventive protocols, quality metrics, and best practice recommendations. A written personalized care plan for preventive services as well as general preventive health recommendations were provided to patient.  See attached scanned questionnaire for additional information.   Signed,   Lindell Noe, MHA, BS, LPN Health Coach

## 2018-08-28 NOTE — Patient Instructions (Signed)
Justin Benson , Thank you for taking time to come for your Medicare Wellness Visit. I appreciate your ongoing commitment to your health goals. Please review the following plan we discussed and let me know if I can assist you in the future.   These are the goals we discussed: Goals    . Follow up with Primary Care Provider     Starting 08/28/2018, I will continue to take medications as prescribed and to keep appointments with PCP as scheduled.        This is a list of the screening recommended for you and due dates:  Health Maintenance  Topic Date Due  . DTaP/Tdap/Td vaccine (1 - Tdap) 08/29/2019*  . Hemoglobin A1C  02/26/2019  . Complete foot exam   02/27/2019  . Eye exam for diabetics  03/24/2019  . Tetanus Vaccine  08/29/2019  . Flu Shot  Completed  . Pneumonia vaccines  Completed  *Topic was postponed. The date shown is not the original due date.   Preventive Care for Adults  A healthy lifestyle and preventive care can promote health and wellness. Preventive health guidelines for adults include the following key practices.  . A routine yearly physical is a good way to check with your health care provider about your health and preventive screening. It is a chance to share any concerns and updates on your health and to receive a thorough exam.  . Visit your dentist for a routine exam and preventive care every 6 months. Brush your teeth twice a day and floss once a day. Good oral hygiene prevents tooth decay and gum disease.  . The frequency of eye exams is based on your age, health, family medical history, use  of contact lenses, and other factors. Follow your health care provider's recommendations for frequency of eye exams.  . Eat a healthy diet. Foods like vegetables, fruits, whole grains, low-fat dairy products, and lean protein foods contain the nutrients you need without too many calories. Decrease your intake of foods high in solid fats, added sugars, and salt. Eat the right  amount of calories for you. Get information about a proper diet from your health care provider, if necessary.  . Regular physical exercise is one of the most important things you can do for your health. Most adults should get at least 150 minutes of moderate-intensity exercise (any activity that increases your heart rate and causes you to sweat) each week. In addition, most adults need muscle-strengthening exercises on 2 or more days a week.  Silver Sneakers may be a benefit available to you. To determine eligibility, you may visit the website: www.silversneakers.com or contact program at 4504713884 Mon-Fri between 8AM-8PM.   . Maintain a healthy weight. The body mass index (BMI) is a screening tool to identify possible weight problems. It provides an estimate of body fat based on height and weight. Your health care provider can find your BMI and can help you achieve or maintain a healthy weight.   For adults 20 years and older: ? A BMI below 18.5 is considered underweight. ? A BMI of 18.5 to 24.9 is normal. ? A BMI of 25 to 29.9 is considered overweight. ? A BMI of 30 and above is considered obese.   . Maintain normal blood lipids and cholesterol levels by exercising and minimizing your intake of saturated fat. Eat a balanced diet with plenty of fruit and vegetables. Blood tests for lipids and cholesterol should begin at age 77 and be repeated every 5  years. If your lipid or cholesterol levels are high, you are over 50, or you are at high risk for heart disease, you may need your cholesterol levels checked more frequently. Ongoing high lipid and cholesterol levels should be treated with medicines if diet and exercise are not working.  . If you smoke, find out from your health care provider how to quit. If you do not use tobacco, please do not start.  . If you choose to drink alcohol, please do not consume more than 2 drinks per day. One drink is considered to be 12 ounces (355 mL) of beer, 5  ounces (148 mL) of wine, or 1.5 ounces (44 mL) of liquor.  . If you are 60-59 years old, ask your health care provider if you should take aspirin to prevent strokes.  . Use sunscreen. Apply sunscreen liberally and repeatedly throughout the day. You should seek shade when your shadow is shorter than you. Protect yourself by wearing long sleeves, pants, a wide-brimmed hat, and sunglasses year round, whenever you are outdoors.  . Once a month, do a whole body skin exam, using a mirror to look at the skin on your back. Tell your health care provider of new moles, moles that have irregular borders, moles that are larger than a pencil eraser, or moles that have changed in shape or color.

## 2018-08-28 NOTE — Progress Notes (Signed)
PCP notes:   Health maintenance:  A1C - completed  Abnormal screenings:   Hearing - failed  Hearing Screening   125Hz  250Hz  500Hz  1000Hz  2000Hz  3000Hz  4000Hz  6000Hz  8000Hz   Right ear:   40 40 40  0    Left ear:   40 40 40  0      Depression score:  16 Depression screen [facility] 2/9 08/28/2018 08/20/2017 06/10/2016 06/07/2015 06/02/2014  Decreased Interest 2 1 2 1  0  Down, Depressed, Hopeless 2 1 0 1 0  PHQ - 2 Score 4 2 2 2  0  Altered sleeping 2 0 0 0 -  Tired, decreased energy 3 1 2  0 -  Change in appetite 1 0 0 0 -  Feeling bad or failure about yourself  3 1 2  0 -  Trouble concentrating 3 0 0 0 -  Moving slowly or fidgety/restless 0 0 0 0 -  Suicidal thoughts 0 0 0 0 -  PHQ-9 Score 16 4 6 2  -  Difficult doing work/chores Not difficult at all Not difficult at all Not difficult at all - -    Patient concerns:   Patient requested dosage of Zoloft be increased because his emotional health was better.   Alcohol intake - seeking services from [facility] to help with excessive alcohol consumption. Consumes 2-6 beers/daily 3-7 days per week.   Decreased ROM in right arm related to rotator cuff  Increased blood glucose levels  Nurse concerns:  20 additional minutes spent discussig depression   Next PCP appt:   09/01/18 @ 1130

## 2018-09-01 ENCOUNTER — Ambulatory Visit (INDEPENDENT_AMBULATORY_CARE_PROVIDER_SITE_OTHER): Payer: Medicare Other | Admitting: Family Medicine

## 2018-09-01 ENCOUNTER — Encounter: Payer: Self-pay | Admitting: Family Medicine

## 2018-09-01 VITALS — BP 128/68 | HR 65 | Temp 97.6°F | Ht 68.0 in | Wt 133.2 lb

## 2018-09-01 DIAGNOSIS — E1169 Type 2 diabetes mellitus with other specified complication: Secondary | ICD-10-CM

## 2018-09-01 DIAGNOSIS — E785 Hyperlipidemia, unspecified: Secondary | ICD-10-CM | POA: Diagnosis not present

## 2018-09-01 DIAGNOSIS — F1010 Alcohol abuse, uncomplicated: Secondary | ICD-10-CM

## 2018-09-01 DIAGNOSIS — E039 Hypothyroidism, unspecified: Secondary | ICD-10-CM

## 2018-09-01 DIAGNOSIS — Z0000 Encounter for general adult medical examination without abnormal findings: Secondary | ICD-10-CM | POA: Diagnosis not present

## 2018-09-01 DIAGNOSIS — I700 Atherosclerosis of aorta: Secondary | ICD-10-CM

## 2018-09-01 DIAGNOSIS — J449 Chronic obstructive pulmonary disease, unspecified: Secondary | ICD-10-CM

## 2018-09-01 DIAGNOSIS — F331 Major depressive disorder, recurrent, moderate: Secondary | ICD-10-CM

## 2018-09-01 DIAGNOSIS — K7010 Alcoholic hepatitis without ascites: Secondary | ICD-10-CM

## 2018-09-01 DIAGNOSIS — R946 Abnormal results of thyroid function studies: Secondary | ICD-10-CM

## 2018-09-01 DIAGNOSIS — N400 Enlarged prostate without lower urinary tract symptoms: Secondary | ICD-10-CM

## 2018-09-01 DIAGNOSIS — I10 Essential (primary) hypertension: Secondary | ICD-10-CM

## 2018-09-01 DIAGNOSIS — D500 Iron deficiency anemia secondary to blood loss (chronic): Secondary | ICD-10-CM

## 2018-09-01 MED ORDER — METFORMIN HCL ER 500 MG PO TB24: ORAL_TABLET | ORAL | 3 refills | Status: DC

## 2018-09-01 NOTE — Assessment & Plan Note (Signed)
Deteriorated A1c recently - will increase metformin XR to 3 a day. Caution in liver disease. Recheck in 4 months.

## 2018-09-01 NOTE — Assessment & Plan Note (Signed)
Reviewed with patient - will continue to monitor.

## 2018-09-01 NOTE — Assessment & Plan Note (Signed)
Chronic, stable. Continue current regimen. 

## 2018-09-01 NOTE — Assessment & Plan Note (Signed)
Tolerating oral iron QOD dosing - continue. Update labs next blood work.

## 2018-09-01 NOTE — Assessment & Plan Note (Signed)
Chronic, stable on low dose lipitor. Continue.  The ASCVD Risk score Mikey Bussing DC Jr., et al., 2013) failed to calculate for the following reasons:   The 2013 ASCVD risk score is only valid for ages 57 to 59

## 2018-09-01 NOTE — Assessment & Plan Note (Signed)
Continue statin. 

## 2018-09-01 NOTE — Patient Instructions (Addendum)
We will refer you to counselor - see Rosaria Ferries on your way out.  Sugar control was worse - increase metformin to XR 500mg  2 in the morning and 1 at night.  Liver function was elevated - work on full alcohol cessation.  We will monitor thyroid function.  Return in 3-4 months for follow up visit.   Health Maintenance After Age 82 After age 23, you are at a higher risk for certain long-term diseases and infections as well as injuries from falls. Falls are a major cause of broken bones and head injuries in people who are older than age 57. Getting regular preventive care can help to keep you healthy and well. Preventive care includes getting regular testing and making lifestyle changes as recommended by your health care provider. Talk with your health care provider about:  Which screenings and tests you should have. A screening is a test that checks for a disease when you have no symptoms.  A diet and exercise plan that is right for you. What should I know about screenings and tests to prevent falls? Screening and testing are the best ways to find a health problem early. Early diagnosis and treatment give you the best chance of managing medical conditions that are common after age 35. Certain conditions and lifestyle choices may make you more likely to have a fall. Your health care provider may recommend:  Regular vision checks. Poor vision and conditions such as cataracts can make you more likely to have a fall. If you wear glasses, make sure to get your prescription updated if your vision changes.  Medicine review. Work with your health care provider to regularly review all of the medicines you are taking, including over-the-counter medicines. Ask your health care provider about any side effects that may make you more likely to have a fall. Tell your health care provider if any medicines that you take make you feel dizzy or sleepy.  Osteoporosis screening. Osteoporosis is a condition that causes the  bones to get weaker. This can make the bones weak and cause them to break more easily.  Blood pressure screening. Blood pressure changes and medicines to control blood pressure can make you feel dizzy.  Strength and balance checks. Your health care provider may recommend certain tests to check your strength and balance while standing, walking, or changing positions.  Foot health exam. Foot pain and numbness, as well as not wearing proper footwear, can make you more likely to have a fall.  Depression screening. You may be more likely to have a fall if you have a fear of falling, feel emotionally low, or feel unable to do activities that you used to do.  Alcohol use screening. Using too much alcohol can affect your balance and may make you more likely to have a fall. What actions can I take to lower my risk of falls? General instructions  Talk with your health care provider about your risks for falling. Tell your health care provider if: ? You fall. Be sure to tell your health care provider about all falls, even ones that seem minor. ? You feel dizzy, sleepy, or off-balance.  Take over-the-counter and prescription medicines only as told by your health care provider. These include any supplements.  Eat a healthy diet and maintain a healthy weight. A healthy diet includes low-fat dairy products, low-fat (lean) meats, and fiber from whole grains, beans, and lots of fruits and vegetables. Home safety  Remove any tripping hazards, such as rugs, cords, and  clutter.  Install safety equipment such as grab bars in bathrooms and safety rails on stairs.  Keep rooms and walkways well-lit. Activity   Follow a regular exercise program to stay fit. This will help you maintain your balance. Ask your health care provider what types of exercise are appropriate for you.  If you need a cane or walker, use it as recommended by your health care provider.  Wear supportive shoes that have nonskid  soles. Lifestyle  Do not drink alcohol if your health care provider tells you not to drink.  If you drink alcohol, limit how much you have: ? 0-1 drink a day for women. ? 0-2 drinks a day for men.  Be aware of how much alcohol is in your drink. In the [place] one drink equals one typical bottle of beer (12 oz), one-half glass of wine (5 oz), or one shot of hard liquor (1 oz).  Do not use any products that contain nicotine or tobacco, such as cigarettes and e-cigarettes. If you need help quitting, ask your health care provider. Summary  Having a healthy lifestyle and getting preventive care can help to protect your health and wellness after age 67.  Screening and testing are the best way to find a health problem early and help you avoid having a fall. Early diagnosis and treatment give you the best chance for managing medical conditions that are more common for people who are older than age 62.  Falls are a major cause of broken bones and head injuries in people who are older than age 28. Take precautions to prevent a fall at home.  Work with your health care provider to learn what changes you can make to improve your health and wellness and to prevent falls. This information is not intended to replace advice given to you by your health care provider. Make sure you discuss any questions you have with your health care provider. Document Released: 05/28/2017 Document Revised: 05/28/2017 Document Reviewed: 05/28/2017 Elsevier Interactive Patient Education  2019 [employer].

## 2018-09-01 NOTE — Assessment & Plan Note (Addendum)
LFTs elevated today. abd [place] normal 04/2017. Consider updating imaging if persistent tranasaminitis

## 2018-09-01 NOTE — Assessment & Plan Note (Signed)
Preventative protocols reviewed and updated unless pt declined. Discussed healthy diet and lifestyle.  

## 2018-09-01 NOTE — Assessment & Plan Note (Signed)
Continue to encourage cessation. He remains interested, has returned to [place] as of yesterday. Will refer to substance abuse counselor per pt request.

## 2018-09-01 NOTE — Assessment & Plan Note (Signed)
Asxs, off respiratory medication
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
## 2018-09-01 NOTE — Assessment & Plan Note (Signed)
Chronic, on sertraline 100mg  daily. Will refer to counseling.

## 2018-09-01 NOTE — Assessment & Plan Note (Signed)
Chronic, stable on finasteride (PSA consistently <1). Will defer DRE and discussed discontinuing screening due to stability.

## 2018-09-08 ENCOUNTER — Telehealth (HOSPITAL_COMMUNITY): Payer: Self-pay | Admitting: Psychology

## 2018-09-11 ENCOUNTER — Encounter (HOSPITAL_COMMUNITY): Payer: Self-pay | Admitting: Psychology

## 2018-09-11 ENCOUNTER — Ambulatory Visit (INDEPENDENT_AMBULATORY_CARE_PROVIDER_SITE_OTHER): Payer: Medicare Other | Admitting: Psychology

## 2018-09-11 DIAGNOSIS — F1020 Alcohol dependence, uncomplicated: Secondary | ICD-10-CM
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
## 2018-09-21 ENCOUNTER — Encounter: Payer: Self-pay | Admitting: Family Medicine

## 2018-09-21 ENCOUNTER — Encounter: Payer: Self-pay | Admitting: *Deleted

## 2018-09-21 ENCOUNTER — Ambulatory Visit (INDEPENDENT_AMBULATORY_CARE_PROVIDER_SITE_OTHER): Payer: Medicare Other | Admitting: Family Medicine

## 2018-09-21 VITALS — BP 120/64 | HR 60 | Temp 97.6°F | Ht 68.0 in | Wt 133.8 lb

## 2018-09-21 DIAGNOSIS — J069 Acute upper respiratory infection, unspecified: Secondary | ICD-10-CM

## 2018-09-21 DIAGNOSIS — R945 Abnormal results of liver function studies: Secondary | ICD-10-CM | POA: Diagnosis not present

## 2018-09-21 DIAGNOSIS — N3090 Cystitis, unspecified without hematuria: Secondary | ICD-10-CM

## 2018-09-21 DIAGNOSIS — R300 Dysuria: Secondary | ICD-10-CM

## 2018-09-21 DIAGNOSIS — R7989 Other specified abnormal findings of blood chemistry: Secondary | ICD-10-CM

## 2018-09-21 LAB — POC URINALSYSI DIPSTICK (AUTOMATED)
BILIRUBIN UA: NEGATIVE
GLUCOSE UA: POSITIVE — AB
Ketones, UA: NEGATIVE
Nitrite, UA: NEGATIVE
Protein, UA: POSITIVE — AB
Spec Grav, UA: 1.015 (ref 1.010–1.025)
Urobilinogen, UA: 0.2 E.U./dL
pH, UA: 6 (ref 5.0–8.0)

## 2018-09-21 MED ORDER — SULFAMETHOXAZOLE-TRIMETHOPRIM 800-160 MG PO TABS: 1.0000 | ORAL_TABLET | Freq: Two times a day (BID) | ORAL | 0 refills | Status: AC

## 2018-09-21 NOTE — Addendum Note (Signed)
Addended by: Carter Kitten on: 09/21/2018 11:56 AM   Modules accepted: Orders

## 2018-09-21 NOTE — Progress Notes (Signed)
Dr. Frederico Hamman T. Quenisha Lovins, MD, [facility] [place], [address] Phone: [phone] Fax: [phone]  09/21/2018  Patient: Justin Benson, MRN: [id number], DOB: Aug 10, 1934, 84 y.o.  Primary Physician:  [name], MD   Chief Complaint  Patient presents with  . Burning with Urination  . Urinary Urgency   Subjective:   This 84 y.o. male patient presents with burning, urgency. No vaginal discharge or external irritation.  No STD exposure. No abd pain, no flank pain.  Felt like he may have a UTI since last Thursday and had some burning with urination and a little worse and a little better.  Feels like he has to go and has a hard time going.  Not recurrently.   Also has a URI.  No topical changes.   Felt a little bit feverish - ? Related.   The PMH, PSH, Social History, Family History, Medications, and allergies have been reviewed in [facility], and have been updated if relevant.  Patient Active Problem List   Diagnosis Date Noted  . Alcohol use disorder, severe, dependence ([place]) 09/11/2018  . Iron deficiency anemia 08/30/2017  . Borderline hypothyroidism 08/30/2017  . Kidney cyst, acquired 05/26/2017  . Chronic lower back pain 06/24/2016  . Serous otitis media with rupture of tympanic membrane 03/28/2016  . Abdominal aortic atherosclerosis ([place]) 06/05/2014  . Advanced care planning/counseling discussion 06/02/2014  . Alcoholic hepatitis without ascites 06/02/2014  . Health maintenance examination 06/02/2014  . Medicare annual wellness visit, subsequent 06/01/2013  . Weight loss 01/11/2013  . Right shoulder pain 11/30/2012  . Schwannoma of spinal cord ([place]) 11/26/2012  . Chest pain 05/29/2012  . DIVERTICULOSIS OF COLON 12/22/2008  . HLD (hyperlipidemia) 01/10/2007  . Alcohol abuse 01/10/2007  . Ex-smoker 01/10/2007  . MDD (major depressive disorder), recurrent episode, moderate ([place]) 01/10/2007  . COPD  (chronic obstructive pulmonary disease) ([place]) 01/10/2007  . Benign prostatic hyperplasia 01/10/2007  . Type 2 diabetes mellitus with other specified complication ([place]) 37/04/6268  . Essential hypertension 01/09/2007    Past Medical History:  Diagnosis Date  . AAA (abdominal aortic aneurysm) ([place]) 05/2012   mild 2.3 cm , rpt 1 year  . Alcoholic hepatitis without ascites 06/02/2014   Normal viral hep panel, normal abd [place] (2015)  . Anemia   . Anxiety   . Arthritis    fingers and hip  . BPH (benign prostatic hypertrophy)   . Choledocholithiasis 11/2012   s/p ERCP, s/p lap chole  . COPD (chronic obstructive pulmonary disease) ([place])   . Depression   . Diabetes mellitus 4/02  . GERD (gastroesophageal reflux disease)   . Hyperlipidemia   . Hypertension 1996  . Pneumonia as a baby  . 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 Schwannoma of spinal cord ([place]) 11/2012, 09/2015   medial to R psoas muscle, 4cm size  . 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 Substance abuse ([place])    I probably drink too much, dr, told me to cut down    Past Surgical History:  Procedure Laterality Date  . BASAL CELL CARCINOMA EXCISION Right 05/2017   right ear; Dr. Elvera Lennox, [facility]  . CHOLECYSTECTOMY N/A 03/08/2013   Procedure: LAPAROSCOPIC CHOLECYSTECTOMY WITH INTRAOPERATIVE CHOLANGIOGRAM;  Surgeon: Harl Bowie, MD;  [facility];  Service: General;  Laterality: N/A;  . CIRCUMCISION  10/30/09   Dr Gaynelle Arabian  . COLONOSCOPY  11/2008   2  small polyps (1 tubular), diverticulosis, lipoma, int/ext hemorrhoids [name])  . COLONOSCOPY  02/2014   1 polyp, diverticulosis, no rpt needed [name])  . COLONOSCOPY  01/2018   1 TA [name])  . ENDOSCOPIC RETROGRADE CHOLANGIOPANCREATOGRAPHY (ERCP) WITH PROPOFOL N/A 12/24/2012   Procedure: ENDOSCOPIC RETROGRADE CHOLANGIOPANCREATOGRAPHY (ERCP) WITH PROPOFOL;  Surgeon: Milus Banister, MD;  Location: [facility];  Service: Endoscopy;   Laterality: N/A;  . ESOPHAGOGASTRODUODENOSCOPY  01/2018   friable duodenal mucosa, mild gastritis s/p benign biopsies [name])  . LAPAROSCOPIC CHOLECYSTECTOMY  02/2013   Dr. Ninfa Linden    Social History   Socioeconomic History  . Marital status: Single    Spouse name: Not on file  . Number of children: 1  . Years of education: Not on file  . Highest education level: Not on file  Occupational History  . Occupation: Chartered certified accountant: retired    Comment: retired 1999  Social Needs  . Financial resource strain: Not on file  . Food insecurity:    Worry: Not on file    Inability: Not on file  . Transportation needs:    Medical: Not on file    Non-medical: Not on file  Tobacco Use  . Smoking status: Former Smoker    Packs/day: 2.00    Years: 30.00    Pack years: 60.00    Types: Cigarettes    Start date: 07/29/1962    Last attempt to quit: 07/29/1992    Years since quitting: 26.1  . Smokeless tobacco: Never Used  Substance and Sexual Activity  . Alcohol use: Yes    Alcohol/week: 3.0 standard drinks    Types: 3 Cans of beer per week    Comment: 3 cans of beer daily  . Drug use: No  . Sexual activity: Yes  Lifestyle  . Physical activity:    Days per week: Not on file    Minutes per session: Not on file  . Stress: Not on file  Relationships  . Social connections:    Talks on phone: Not on file    Gets together: Not on file    Attends religious service: Not on file    Active member of club or organization: Not on file    Attends meetings of clubs or organizations: Not on file    Relationship status: Not on file  . Intimate partner violence:    Fear of current or ex partner: Not on file    Emotionally abused: Not on file    Physically abused: Not on file    Forced sexual activity: Not on file  Other Topics Concern  . 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    Family History  Problem Relation Age of Onset  . Prostate cancer Father   . Aneurysm Father        aortic  . Stroke Father   . Cancer Sister        bladder (nonsmoker)  . Hodgkin's lymphoma Brother 80  . Prostate cancer Brother 85       1/2  . Prostate cancer Brother 71       1/2  . Other Sister  cerebral hemm  . Lupus Sister   . Diabetes Brother   . Colon cancer Neg Hx     No Known Allergies  Medication list reviewed and updated in full in [place].  GEN:  no fevers, chills. GI: No n/v/d, eating normally Otherwise, ROS is as per the HPI.  Objective:   Blood pressure 120/64, pulse 60, temperature 97.6 F (36.4 C), temperature source Oral, height 5\' 8"  (1.727 m), weight 133 lb 12 oz (60.7 kg).  GEN: WDWN, A&Ox4,NAD. Non-toxic HEENT: Atraumatc, normocephalic. CV: RRR, No M/G/R PULM: CTA B, No wheezes, crackles, or rhonchi ABD: S, NT, ND, +BS, no rebound. No CVAT. No suprapubic tenderness. EXT: No c/c/e  Objective Data: Results for orders placed or performed in visit on 09/21/18  POCT Urinalysis Dipstick (Automated)  Result Value Ref Range   Color, UA yellow    Clarity, UA hazy    Glucose, UA Positive (A) Negative   Bilirubin, UA Negative    Ketones, UA Negative    Spec Grav, UA 1.015 1.010 - 1.025   Blood, UA Moderate    pH, UA 6.0 5.0 - 8.0   Protein, UA Positive (A) Negative   Urobilinogen, UA 0.2 0.2 or 1.0 E.U./dL   Nitrite, UA Negative    Leukocytes, UA Trace (A) Negative    Assessment and Plan:   Cystitis  Burning with urination - Plan: POCT Urinalysis Dipstick (Automated)  URI, acute  Elevated LFTs  Rx with ABX as below. Drink plenty of fluids and supportive care.  Follow-up: No follow-ups on  file.  Meds ordered this encounter  Medications  . sulfamethoxazole-trimethoprim (BACTRIM DS,SEPTRA DS) 800-160 MG tablet    Sig: Take 1 tablet by mouth 2 (two) times daily for 7 days.    Dispense:  14 tablet    Refill:  0   Orders Placed This Encounter  Procedures  . POCT Urinalysis Dipstick (Automated)    Signed,  Jaquaveon Bilal T. Jeramiah Mccaughey, MD   Patient's Medications  New Prescriptions   SULFAMETHOXAZOLE-TRIMETHOPRIM (BACTRIM DS,SEPTRA DS) 800-160 MG TABLET    Take 1 tablet by mouth 2 (two) times daily for 7 days.  Previous Medications   AMLODIPINE (NORVASC) 10 MG TABLET    TAKE 1 TABLET BY MOUTH  DAILY   ATORVASTATIN (LIPITOR) 10 MG TABLET    TAKE ONE-HALF TABLET BY  MOUTH DAILY AT 6 PM.   FERROUS SULFATE 325 (65 FE) MG TABLET    Take 1 tablet (325 mg total) by mouth every other day.   FINASTERIDE (PROSCAR) 5 MG TABLET    TAKE 1 TABLET BY MOUTH  EVERY EVENING   METFORMIN (GLUCOPHAGE XR) 500 MG 24 HR TABLET    Take 2 tablets (1000mg ) in the morning and 1 tablet (500mg ) at night time   MULTIPLE VITAMIN (MULTIVITAMIN WITH MINERALS) TABS    Take 1 tablet by mouth daily.   OMEPRAZOLE (PRILOSEC) 40 MG CAPSULE    Take 40 mg by mouth daily.   QUINAPRIL (ACCUPRIL) 20 MG TABLET    TAKE 1 TABLET BY MOUTH AT  BEDTIME   QUINAPRIL-HYDROCHLOROTHIAZIDE (ACCURETIC) 20-25 MG TABLET    TAKE 1 TABLET BY MOUTH  DAILY   SERTRALINE (ZOLOFT) 100 MG TABLET    TAKE 1 TABLET BY MOUTH  DAILY  Modified Medications   No medications on file  Discontinued Medications   No medications on file

## 2018-09-23 LAB — URINE CULTURE
MICRO NUMBER:: 233323
SPECIMEN QUALITY: ADEQUATE

## 2018-10-04 ENCOUNTER — Ambulatory Visit (HOSPITAL_COMMUNITY)
Admission: EM | Admit: 2018-10-04 | Discharge: 2018-10-04 | Discharge status: Against Medical Advice | Payer: Medicare Other

## 2018-10-22 NOTE — Progress Notes (Signed)
I reviewed health advisor's note, was available for consultation, and agree with documentation and plan.  

## 2018-11-16 ENCOUNTER — Other Ambulatory Visit: Payer: Self-pay | Admitting: Family Medicine

## 2018-11-27 DIAGNOSIS — C4491 Basal cell carcinoma of skin, unspecified: Secondary | ICD-10-CM

## 2018-11-27 HISTORY — DX: Basal cell carcinoma of skin, unspecified: C44.91

## 2018-12-08 ENCOUNTER — Encounter: Payer: Self-pay | Admitting: Family Medicine

## 2018-12-31 ENCOUNTER — Ambulatory Visit (INDEPENDENT_AMBULATORY_CARE_PROVIDER_SITE_OTHER): Payer: Medicare Other | Admitting: Family Medicine

## 2018-12-31 ENCOUNTER — Encounter: Payer: Self-pay | Admitting: Family Medicine

## 2018-12-31 VITALS — BP 131/71 | HR 75 | Temp 97.7°F | Ht 68.0 in | Wt 131.0 lb

## 2018-12-31 DIAGNOSIS — F1021 Alcohol dependence, in remission: Secondary | ICD-10-CM | POA: Diagnosis not present

## 2018-12-31 DIAGNOSIS — D500 Iron deficiency anemia secondary to blood loss (chronic): Secondary | ICD-10-CM

## 2018-12-31 DIAGNOSIS — K7010 Alcoholic hepatitis without ascites: Secondary | ICD-10-CM

## 2018-12-31 DIAGNOSIS — F331 Major depressive disorder, recurrent, moderate: Secondary | ICD-10-CM

## 2018-12-31 DIAGNOSIS — E1169 Type 2 diabetes mellitus with other specified complication: Secondary | ICD-10-CM

## 2018-12-31 DIAGNOSIS — I10 Essential (primary) hypertension: Secondary | ICD-10-CM

## 2018-12-31 DIAGNOSIS — R946 Abnormal results of thyroid function studies: Secondary | ICD-10-CM | POA: Diagnosis not present

## 2018-12-31 DIAGNOSIS — E039 Hypothyroidism, unspecified: Secondary | ICD-10-CM

## 2018-12-31 NOTE — Assessment & Plan Note (Signed)
Update ferritin on oral iron QOD.

## 2018-12-31 NOTE — Progress Notes (Signed)
Virtual visit completed through [employer]. Due to national recommendations of social distancing due to COVID-19, a virtual visit is felt to be most appropriate for this patient at this time. Reviewed limitations of a virtual visit. Interactive audio and video telecommunications were attempted between myself and Justin Benson, however failed due to patient having technical difficulties. We continued and completed visit with audio only.  Time: 10:40am - 10:59am   Patient location: home Provider location: [place] at [facility], office If any vitals were documented, they were collected by patient at home unless specified below.    BP 131/71   Pulse 75   Temp 97.7 F (36.5 C) (Oral)   Ht 5\' 8"  (1.727 m)   Wt 131 lb (59.4 kg)   BMI 19.92 kg/m   CC: 4 mo f/u visit  Subjective:    Patient ID: Justin Benson, male    DOB: [birth year], 84 y.o.   MRN: [id number]  HPI: Justin Benson is a 84 y.o. male presenting on 12/31/2018 for Follow-up (4 mo f/u.  BS is morning was 173.) and Discuss Medication (Wants to discuss omeprazole.)   Alcoholism - Last visit we referred to counselor - saw Justin Benson for initial intake eval. Last alcoholic drink was 0/76/2263!! - now involved in [place] and has sponsor - has decided to take this seriously.   MDD - last visit we recommended counselor as well - currently not seeing anyone, feels doing well. Continues zoloft 100mg  daily.   Marked fatigue noted over last several months. Notices after waking up or walking around the neighborhood. Increased daytime sleepiness. No night sweats or unexpected weight loss.   Noticing some headaches recently.   GERD - minimal symptoms at this time. Now that he has stopped alcohol, asks about decreasing PPI use.   DM - does regularly check sugars - elevated to 143 fasting today (usually 110-130s). Compliant with antihyperglycemic regimen which includes: metformin XL 1500mg  daily. Denies low sugars or hypoglycemic symptoms. Denies  paresthesias. Last diabetic eye exam 02/2018. Pneumovax: 2004. Prevnar: 2015. Glucometer brand: prodigy. DSME: completed remotely. Lab Results  Component Value Date   HGBA1C 8.1 (H) 08/28/2018   Diabetic Foot Exam - Simple   No data filed     Lab Results  Component Value Date   MICROALBUR <0.7 06/02/2015         Relevant past medical, surgical, family and social history reviewed and updated as indicated. Interim medical history since our last visit reviewed. Allergies and medications reviewed and updated. Outpatient Medications Prior to Visit  Medication Sig Dispense Refill  . amLODipine (NORVASC) 10 MG tablet TAKE 1 TABLET BY MOUTH  DAILY 90 tablet 0  . atorvastatin (LIPITOR) 10 MG tablet TAKE ONE-HALF TABLET BY  MOUTH DAILY AT 6 PM. 45 tablet 0  . ferrous sulfate 325 (65 FE) MG tablet Take 1 tablet (325 mg total) by mouth every other day.  3  . finasteride (PROSCAR) 5 MG tablet TAKE 1 TABLET BY MOUTH  EVERY EVENING 90 tablet 0  . metFORMIN (GLUCOPHAGE XR) 500 MG 24 hr tablet Take 2 tablets (1000mg ) in the morning and 1 tablet (500mg ) at night time 270 tablet 3  . Multiple Vitamin (MULTIVITAMIN WITH MINERALS) TABS Take 1 tablet by mouth daily.    Marland Kitchen omeprazole (PRILOSEC) 40 MG capsule Take 40 mg by mouth daily.    . quinapril (ACCUPRIL) 20 MG tablet TAKE 1 TABLET BY MOUTH AT  BEDTIME 90 tablet 0  . quinapril-hydrochlorothiazide (ACCURETIC)  20-25 MG tablet TAKE 1 TABLET BY MOUTH  DAILY 90 tablet 0  . sertraline (ZOLOFT) 100 MG tablet TAKE 1 TABLET BY MOUTH  DAILY 90 tablet 0   No facility-administered medications prior to visit.      Per HPI unless specifically indicated in ROS section below Review of Systems Objective:    BP 131/71   Pulse 75   Temp 97.7 F (36.5 C) (Oral)   Ht 5\' 8"  (1.727 m)   Wt 131 lb (59.4 kg)   BMI 19.92 kg/m   Wt Readings from Last 3 Encounters:  12/31/18 131 lb (59.4 kg)  09/21/18 133 lb 12 oz (60.7 kg)  09/01/18 133 lb 4 oz (60.4 kg)      Physical exam: Gen: alert, NAD, not ill appearing Pulm: speaks in complete sentences without increased work of breathing Psych: normal mood, normal thought content      Lab Results  Component Value Date   HGBA1C 8.1 (H) 08/28/2018    Lab Results  Component Value Date   CREATININE 0.78 08/28/2018   BUN 14 08/28/2018   NA 138 08/28/2018   K 4.5 08/28/2018   CL 98 08/28/2018   CO2 33 (H) 08/28/2018    Lab Results  Component Value Date   ALT 159 (H) 08/28/2018   AST 201 (H) 08/28/2018   ALKPHOS 129 (H) 08/28/2018   BILITOT 0.5 08/28/2018    Lab Results  Component Value Date   WBC 4.1 06/03/2018   HGB 13.6 06/03/2018   HCT 39.7 06/03/2018   MCV 92.8 06/03/2018   PLT 221.0 06/03/2018    Lab Results  Component Value Date   TSH 5.85 (H) 08/28/2018    Assessment & Plan:   Problem List Items Addressed This Visit    Type 2 diabetes mellitus with other specified complication ([place]) - Primary    Update A1c tomorrow. Continue metformin XL.       Relevant Orders   Hemoglobin A1c   MDD (major depressive disorder), recurrent episode, moderate (HCC)    Chronic, stable on sertraline 100mg  daily. Not currently seeing counselor      Iron deficiency anemia    Update ferritin on oral iron QOD.       Relevant Orders   CBC with Differential/Platelet   Ferritin   Essential hypertension    Chronic, stable. Continue current regimen.       Borderline hypothyroidism    Update labs. Endorsing increasing fatigue.       Relevant Orders   TSH   T4, free   Alcoholic hepatitis without ascites    Update LFTs tomorrow. If remaining elevated will update abd [place].       Relevant Orders   Comprehensive metabolic panel   Alcohol use disorder, severe, in sustained remission ([place])    Abstinent from alcohol since 08/25/2018! Congratulated. He is attending AA regularly, has a sponsor. Feels well.       Relevant Orders   Vitamin B12   Folate       No orders of the defined types  were placed in this encounter.  Orders Placed This Encounter  Procedures  . Comprehensive metabolic panel    Standing Status:   Future    Standing Expiration Date:   12/31/2019  . TSH    Standing Status:   Future    Standing Expiration Date:   12/31/2019  . T4, free    Standing Status:   Future    Standing Expiration Date:  12/31/2019  . Hemoglobin A1c    Standing Status:   Future    Standing Expiration Date:   12/31/2019  . CBC with Differential/Platelet    Standing Status:   Future    Standing Expiration Date:   12/31/2019  . Ferritin    Standing Status:   Future    Standing Expiration Date:   12/31/2019  . Vitamin B12    Standing Status:   Future    Standing Expiration Date:   12/31/2019  . Folate    Standing Status:   Future    Standing Expiration Date:   12/31/2019    I discussed the assessment and treatment plan with the patient. The patient was provided an opportunity to ask questions and all were answered. The patient agreed with the plan and demonstrated an understanding of the instructions. The patient was advised to call back or seek an in-person evaluation if the symptoms worsen or if the condition fails to improve as anticipated.  Follow up plan: Return in about 4 months (around 05/02/2019) for follow up visit.  [name], MD

## 2018-12-31 NOTE — Assessment & Plan Note (Signed)
Chronic, stable on sertraline 100mg  daily. Not currently seeing counselor

## 2018-12-31 NOTE — Assessment & Plan Note (Signed)
Update A1c tomorrow. Continue metformin XL.

## 2018-12-31 NOTE — Assessment & Plan Note (Addendum)
Chronic, stable. Continue current regimen. 

## 2018-12-31 NOTE — Assessment & Plan Note (Signed)
Update labs. Endorsing increasing fatigue.

## 2018-12-31 NOTE — Assessment & Plan Note (Signed)
Update LFTs tomorrow. If remaining elevated will update abd [place].

## 2018-12-31 NOTE — Assessment & Plan Note (Signed)
Abstinent from alcohol since 08/25/2018! Congratulated. He is attending AA regularly, has a sponsor. Feels well.

## 2019-01-01 ENCOUNTER — Other Ambulatory Visit (INDEPENDENT_AMBULATORY_CARE_PROVIDER_SITE_OTHER): Payer: Medicare Other

## 2019-01-01 DIAGNOSIS — D500 Iron deficiency anemia secondary to blood loss (chronic): Secondary | ICD-10-CM

## 2019-01-01 DIAGNOSIS — R946 Abnormal results of thyroid function studies: Secondary | ICD-10-CM

## 2019-01-01 DIAGNOSIS — K7010 Alcoholic hepatitis without ascites: Secondary | ICD-10-CM | POA: Diagnosis not present

## 2019-01-01 DIAGNOSIS — E1169 Type 2 diabetes mellitus with other specified complication: Secondary | ICD-10-CM

## 2019-01-01 DIAGNOSIS — F1021 Alcohol dependence, in remission: Secondary | ICD-10-CM | POA: Diagnosis not present

## 2019-01-01 DIAGNOSIS — E039 Hypothyroidism, unspecified: Secondary | ICD-10-CM

## 2019-01-01 LAB — COMPREHENSIVE METABOLIC PANEL
ALT: 75 U/L — ABNORMAL HIGH (ref 0–53)
AST: 31 U/L (ref 0–37)
Albumin: 4.3 g/dL (ref 3.5–5.2)
Alkaline Phosphatase: 114 U/L (ref 39–117)
BUN: 21 mg/dL (ref 6–23)
CO2: 32 mEq/L (ref 19–32)
Calcium: 9.4 mg/dL (ref 8.4–10.5)
Chloride: 99 mEq/L (ref 96–112)
Creatinine, Ser: 0.84 mg/dL (ref 0.40–1.50)
GFR: 87.58 mL/min (ref >60.00)
Glucose, Bld: 167 mg/dL — ABNORMAL HIGH (ref 70–99)
Potassium: 4.7 mEq/L (ref 3.5–5.1)
Sodium: 139 mEq/L (ref 135–145)
Total Bilirubin: 0.4 mg/dL (ref 0.2–1.2)
Total Protein: 6.5 g/dL (ref 6.0–8.3)

## 2019-01-01 LAB — CBC WITH DIFFERENTIAL/PLATELET
Basophils Absolute: 0 10*3/uL (ref 0.0–0.1)
Basophils Relative: 0.5 % (ref 0.0–3.0)
Eosinophils Absolute: 0.1 10*3/uL (ref 0.0–0.7)
Eosinophils Relative: 2.4 % (ref 0.0–5.0)
HCT: 37.6 % — ABNORMAL LOW (ref 39.0–52.0)
Hemoglobin: 12.9 g/dL — ABNORMAL LOW (ref 13.0–17.0)
Lymphocytes Relative: 30.5 % (ref 12.0–46.0)
Lymphs Abs: 1.4 10*3/uL (ref 0.7–4.0)
MCHC: 34.5 g/dL (ref 30.0–36.0)
MCV: 88.7 fl (ref 78.0–100.0)
Monocytes Absolute: 0.5 10*3/uL (ref 0.1–1.0)
Monocytes Relative: 10.8 % (ref 3.0–12.0)
Neutro Abs: 2.5 10*3/uL (ref 1.4–7.7)
Neutrophils Relative %: 55.8 % (ref 43.0–77.0)
Platelets: 188 10*3/uL (ref 150.0–400.0)
RBC: 4.24 Mil/uL (ref 4.22–5.81)
RDW: 13.8 % (ref 11.5–15.5)
WBC: 4.5 10*3/uL (ref 4.0–10.5)

## 2019-01-01 LAB — VITAMIN B12: Vitamin B-12: 355 pg/mL (ref 211–911)

## 2019-01-01 LAB — FERRITIN: Ferritin: 45.4 ng/mL (ref 22.0–322.0)

## 2019-01-01 LAB — HEMOGLOBIN A1C: Hgb A1c MFr Bld: 7.9 % — ABNORMAL HIGH (ref 4.6–6.5)

## 2019-01-01 LAB — TSH: TSH: 4.96 u[IU]/mL — ABNORMAL HIGH (ref 0.35–4.50)

## 2019-01-01 LAB — FOLATE: Folate: >23.9 ng/mL (ref >5.9)

## 2019-01-01 LAB — T4, FREE: Free T4: 0.89 ng/dL (ref 0.60–1.60)

## 2019-01-04 ENCOUNTER — Telehealth: Payer: Self-pay | Admitting: Family Medicine

## 2019-01-04 ENCOUNTER — Other Ambulatory Visit: Payer: Self-pay | Admitting: Family Medicine

## 2019-01-04 MED ORDER — METFORMIN HCL ER 500 MG PO TB24: 1000.0000 mg | ORAL_TABLET | Freq: Two times a day (BID) | ORAL | 1 refills | Status: DC

## 2019-01-04 NOTE — Telephone Encounter (Signed)
Patient is returning a call in regards to his lab results.

## 2019-01-04 NOTE — Telephone Encounter (Signed)
See labs 

## 2019-01-06 NOTE — Telephone Encounter (Signed)
Left message on vm per dpr relaying results. Mailed results to pt.  [See Labs result notes, 01/01/19.]

## 2019-01-06 NOTE — Telephone Encounter (Signed)
Patient called back to get his lab results. He said the labs can just be mailed to him, if you don't need to speak to him. If you do need to speak to him,  patient can be reached at [phone].

## 2019-01-21 ENCOUNTER — Telehealth: Payer: Self-pay | Admitting: Family Medicine

## 2019-01-21 NOTE — Telephone Encounter (Signed)
Pt dropped off letter to update Dr. Danise Mina on how he is feeling. Placed on cart.

## 2019-01-22 NOTE — Telephone Encounter (Signed)
I never received letter.

## 2019-01-23 MED ORDER — LEVOTHYROXINE SODIUM 25 MCG PO TABS: 25.0000 ug | ORAL_TABLET | Freq: Every day | ORAL | 6 refills | Status: DC

## 2019-01-23 NOTE — Telephone Encounter (Addendum)
Received letter - endorsing fatigue and constipation for several months.  This in setting of subclinical hypothyroidism. rec we start levothyroxine 48mcg daily sent to pharmacy. Take thyroid medicine first thing when he wakes up on an empty stomach with a glass of water, separate by at least 1 hour from breakfast and other medicines and supplements especially metformin and iron. Ideally should take iron at lunch.  79mo supply sent to local pharmacy to trial.  Would offer 2 month lab visit to recheck levels or we can recheck at [place] 04/2019

## 2019-01-23 NOTE — Addendum Note (Signed)
Addended by: [name] on: 01/23/2019 11:36 AM   Modules accepted: Orders

## 2019-01-25 NOTE — Telephone Encounter (Signed)
Spoke with pt relaying Dr. Synthia Innocent message and instructions.  Pt verbalizes understanding.  Prefers to have thyroid labs at 05/06/19 OV. Fyi to Dr. Darnell Level.

## 2019-02-09 ENCOUNTER — Ambulatory Visit (INDEPENDENT_AMBULATORY_CARE_PROVIDER_SITE_OTHER): Payer: Medicare Other | Admitting: Family Medicine

## 2019-02-09 ENCOUNTER — Other Ambulatory Visit: Payer: Self-pay

## 2019-02-09 ENCOUNTER — Other Ambulatory Visit: Payer: Self-pay | Admitting: Family Medicine

## 2019-02-09 ENCOUNTER — Encounter: Payer: Self-pay | Admitting: Family Medicine

## 2019-02-09 ENCOUNTER — Ambulatory Visit: Payer: Medicare Other | Admitting: Family Medicine

## 2019-02-09 VITALS — BP 168/62 | HR 64 | Temp 98.7°F | Resp 18 | Ht 68.0 in | Wt 132.8 lb

## 2019-02-09 DIAGNOSIS — I10 Essential (primary) hypertension: Secondary | ICD-10-CM | POA: Diagnosis not present

## 2019-02-09 DIAGNOSIS — R600 Localized edema: Secondary | ICD-10-CM | POA: Diagnosis not present

## 2019-02-09 NOTE — Assessment & Plan Note (Signed)
Discussed that it may be amlodipine, but will get blood work to look for signs of nephrotic syndrome or heart failure though exam overall reassuring. BP mildly elevated in the office but normal at home. Also advised compression stockings at home as well

## 2019-02-09 NOTE — Progress Notes (Signed)
Subjective:     Justin Benson is a 84 y.o. male presenting for Edema (x 1 week. Feetm ankles and lower legs swelling. Decreased his salt intake and tried to elevate legs. Better some now.)     HPI   #Edema - started 1 week ago - sometimes not clear if swollen or not - b/l swelling - feels these are are 2x the size of normal ankle - was waiting it out - was pulling grass and squatting 1 week ago  - has noticed some knee pain lately - taking amlodipine for "a long time" - just started synthroid ~2 weeks ago - no injury to the feet/ankle - better first thing in the morning -   #HTN - checks bp at home - typically 120-130s/60-70   Review of Systems  Constitutional: Negative for chills and fever.  Eyes: Negative for visual disturbance.  Respiratory: Negative for chest tightness and shortness of breath.   Cardiovascular: Positive for leg swelling. Negative for chest pain and palpitations.  Musculoskeletal: Positive for joint swelling.  Neurological: Positive for headaches (quick stabbing 5 sec HA which have resolved).     Social History   Tobacco Use  Smoking Status Former Smoker  . Packs/day: 2.00  . Years: 30.00  . Pack years: 60.00  . Types: Cigarettes  . Start date: 07/29/1962  . Quit date: 07/29/1992  . Years since quitting: 26.5  Smokeless Tobacco Never Used        Objective:    BP Readings from Last 3 Encounters:  02/09/19 (!) 168/62  12/31/18 131/71  09/21/18 120/64   Wt Readings from Last 3 Encounters:  02/09/19 132 lb 12 oz (60.2 kg)  12/31/18 131 lb (59.4 kg)  09/21/18 133 lb 12 oz (60.7 kg)    BP (!) 168/62   Pulse 64   Temp 98.7 F (37.1 C)   Resp 18   Ht 5\' 8"  (1.727 m)   Wt 132 lb 12 oz (60.2 kg)   SpO2 96%   BMI 20.18 kg/m    Physical Exam Constitutional:      Appearance: Normal appearance. He is not ill-appearing or diaphoretic.  HENT:     Right Ear: External ear normal.     Left Ear: External ear normal.     Nose: Nose  normal.  Eyes:     General: No scleral icterus.    Extraocular Movements: Extraocular movements intact.     Conjunctiva/sclera: Conjunctivae normal.  Neck:     Musculoskeletal: Neck supple.     Vascular: No JVD.  Cardiovascular:     Rate and Rhythm: Normal rate and regular rhythm.     Heart sounds: No murmur.  Pulmonary:     Effort: Pulmonary effort is normal. No respiratory distress.     Breath sounds: Normal breath sounds. No wheezing or rales.  Musculoskeletal:     Right lower leg: Edema present.     Left lower leg: Edema present.     Comments: B/l 2+ foot/ankle, 1+ to knee  Skin:    General: Skin is warm and dry.  Neurological:     Mental Status: He is alert. Mental status is at baseline.  Psychiatric:        Mood and Affect: Mood normal.           Assessment & Plan:   Problem List Items Addressed This Visit      Cardiovascular and Mediastinum   Essential hypertension    BP elevated here, but  normal at home. Pending labs may trial decreasing amlodipine to see if swelling resolves        Other   Bilateral leg edema - Primary    Discussed that it may be amlodipine, but will get blood work to look for signs of nephrotic syndrome or heart failure though exam overall reassuring. BP mildly elevated in the office but normal at home. Also advised compression stockings at home as well       Relevant Orders   Brain natriuretic peptide   Comprehensive metabolic panel   Protein, urine, random       Return if symptoms worsen or fail to improve.  Lesleigh Noe, MD

## 2019-02-09 NOTE — Assessment & Plan Note (Signed)
BP elevated here, but normal at home. Pending labs may trial decreasing amlodipine to see if swelling resolves

## 2019-02-09 NOTE — Patient Instructions (Signed)
Leg swelling  1) Blood work today to look for causes 2) If blood work is normal, we will likely try decreasing the amlodipine medication as this can cause swelling 3) Consider reducing your salt intake, getting compression socks, and elevating you feet to help with the swelling

## 2019-02-10 LAB — COMPREHENSIVE METABOLIC PANEL
ALT: 202 U/L — ABNORMAL HIGH (ref 0–53)
AST: 161 U/L — ABNORMAL HIGH (ref 0–37)
Albumin: 4.6 g/dL (ref 3.5–5.2)
Alkaline Phosphatase: 143 U/L — ABNORMAL HIGH (ref 39–117)
BUN: 12 mg/dL (ref 6–23)
CO2: 34 mEq/L — ABNORMAL HIGH (ref 19–32)
Calcium: 9.6 mg/dL (ref 8.4–10.5)
Chloride: 99 mEq/L (ref 96–112)
Creatinine, Ser: 0.76 mg/dL (ref 0.40–1.50)
GFR: 98.27 mL/min (ref >60.00)
Glucose, Bld: 116 mg/dL — ABNORMAL HIGH (ref 70–99)
Potassium: 4.9 mEq/L (ref 3.5–5.1)
Sodium: 138 mEq/L (ref 135–145)
Total Bilirubin: 0.5 mg/dL (ref 0.2–1.2)
Total Protein: 6.7 g/dL (ref 6.0–8.3)

## 2019-02-10 LAB — BRAIN NATRIURETIC PEPTIDE: Pro B Natriuretic peptide (BNP): 44 pg/mL (ref 0.0–100.0)

## 2019-02-10 LAB — PROTEIN, URINE, RANDOM: Total Protein, Urine: 9 mg/dL (ref 5–25)

## 2019-02-11 ENCOUNTER — Telehealth: Payer: Self-pay

## 2019-02-11 MED ORDER — FERROUS SULFATE 325 (65 FE) MG PO TABS: 325.0000 mg | ORAL_TABLET | Freq: Every day | ORAL | 3 refills | Status: DC

## 2019-02-11 MED ORDER — FERROUS SULFATE 325 (65 FE) MG PO TABS: 325.0000 mg | ORAL_TABLET | ORAL | 3 refills | Status: AC

## 2019-02-11 NOTE — Addendum Note (Signed)
Addended by: Kris Mouton on: 02/11/2019 11:16 AM   Modules accepted: Orders

## 2019-02-11 NOTE — Telephone Encounter (Signed)
Patient advised and medications updated in the chart

## 2019-02-11 NOTE — Telephone Encounter (Signed)
Patient wanted to verify with Dr. Darnell Level on how he should be taking his medication. Patient was seen by Dr. Einar Pheasant on 02/10/2019 and stated that Iron tablet he is taking 1 daily and Omeprazole 1 every other day. Patient states he thought that was what Dr. Darnell Level told him last time. I updated this in the chart. Before that visit chart said Iron take 1 every other day and Omeprazole 1 tablet daily.   Patient wanted to double check on this.

## 2019-02-11 NOTE — Telephone Encounter (Signed)
Let's actually do both QOD (omeprazole and ferrous sulfate).  Let us know if GERD symptoms returning on lower dose of PPI.

## 2019-05-06 ENCOUNTER — Telehealth: Payer: Self-pay

## 2019-05-06 ENCOUNTER — Ambulatory Visit: Payer: Medicare Other | Admitting: Family Medicine

## 2019-05-06 NOTE — Telephone Encounter (Signed)
[place] Night - Client Nonclinical Telephone Record Access[facility]urse Client [place] Night - Client Client Site [place] Physician [name] - MD Contact Type Call Who Is Calling Patient / Member / Family / Caregiver Caller Name Tosh Danesh Caller Phone Number [phone] Call Type Message Only Information Provided Reason for Call Returning a Call from the Office Initial Pope returning a call from the office. Additional Comment Thinks it was concerning his 9:00 AM appointment tomorrow 05/06/2019. Call Closed By: Marinus Maw Transaction Date/Time: 05/05/2019 6:01:04 PM (ET)

## 2019-05-06 NOTE — Telephone Encounter (Signed)
Called pt and he rescheduled appointment for 05/07/19

## 2019-05-07 ENCOUNTER — Ambulatory Visit (INDEPENDENT_AMBULATORY_CARE_PROVIDER_SITE_OTHER): Payer: Medicare Other | Admitting: Family Medicine

## 2019-05-07 ENCOUNTER — Encounter: Payer: Self-pay | Admitting: Family Medicine

## 2019-05-07 ENCOUNTER — Other Ambulatory Visit: Payer: Self-pay

## 2019-05-07 VITALS — BP 142/70 | HR 68 | Temp 97.5°F | Wt 129.0 lb

## 2019-05-07 DIAGNOSIS — R7401 Elevation of levels of liver transaminase levels: Secondary | ICD-10-CM

## 2019-05-07 DIAGNOSIS — E039 Hypothyroidism, unspecified: Secondary | ICD-10-CM

## 2019-05-07 DIAGNOSIS — F331 Major depressive disorder, recurrent, moderate: Secondary | ICD-10-CM

## 2019-05-07 DIAGNOSIS — K7010 Alcoholic hepatitis without ascites: Secondary | ICD-10-CM | POA: Diagnosis not present

## 2019-05-07 DIAGNOSIS — F1021 Alcohol dependence, in remission: Secondary | ICD-10-CM | POA: Diagnosis not present

## 2019-05-07 DIAGNOSIS — R634 Abnormal weight loss: Secondary | ICD-10-CM

## 2019-05-07 DIAGNOSIS — Z23 Encounter for immunization: Secondary | ICD-10-CM | POA: Diagnosis not present

## 2019-05-07 DIAGNOSIS — E1169 Type 2 diabetes mellitus with other specified complication: Secondary | ICD-10-CM | POA: Diagnosis not present

## 2019-05-07 DIAGNOSIS — D500 Iron deficiency anemia secondary to blood loss (chronic): Secondary | ICD-10-CM

## 2019-05-07 LAB — COMPREHENSIVE METABOLIC PANEL
ALT: 103 U/L — ABNORMAL HIGH (ref 0–53)
AST: 111 U/L — ABNORMAL HIGH (ref 0–37)
Albumin: 4.7 g/dL (ref 3.5–5.2)
Alkaline Phosphatase: 141 U/L — ABNORMAL HIGH (ref 39–117)
BUN: 20 mg/dL (ref 6–23)
CO2: 33 mEq/L — ABNORMAL HIGH (ref 19–32)
Calcium: 9.9 mg/dL (ref 8.4–10.5)
Chloride: 99 mEq/L (ref 96–112)
Creatinine, Ser: 0.75 mg/dL (ref 0.40–1.50)
GFR: 99.73 mL/min (ref >60.00)
Glucose, Bld: 106 mg/dL — ABNORMAL HIGH (ref 70–99)
Potassium: 4.4 mEq/L (ref 3.5–5.1)
Sodium: 138 mEq/L (ref 135–145)
Total Bilirubin: 0.5 mg/dL (ref 0.2–1.2)
Total Protein: 7 g/dL (ref 6.0–8.3)

## 2019-05-07 LAB — HEMOGLOBIN A1C: Hgb A1c MFr Bld: 7.3 % — ABNORMAL HIGH (ref 4.6–6.5)

## 2019-05-07 LAB — CBC WITH DIFFERENTIAL/PLATELET
Basophils Absolute: 0 10*3/uL (ref 0.0–0.1)
Basophils Relative: 0.7 % (ref 0.0–3.0)
Eosinophils Absolute: 0.1 10*3/uL (ref 0.0–0.7)
Eosinophils Relative: 2.5 % (ref 0.0–5.0)
HCT: 39.4 % (ref 39.0–52.0)
Hemoglobin: 13.3 g/dL (ref 13.0–17.0)
Lymphocytes Relative: 21.7 % (ref 12.0–46.0)
Lymphs Abs: 1 10*3/uL (ref 0.7–4.0)
MCHC: 33.7 g/dL (ref 30.0–36.0)
MCV: 89.8 fl (ref 78.0–100.0)
Monocytes Absolute: 0.6 10*3/uL (ref 0.1–1.0)
Monocytes Relative: 12.5 % — ABNORMAL HIGH (ref 3.0–12.0)
Neutro Abs: 2.9 10*3/uL (ref 1.4–7.7)
Neutrophils Relative %: 62.6 % (ref 43.0–77.0)
Platelets: 208 10*3/uL (ref 150.0–400.0)
RBC: 4.39 Mil/uL (ref 4.22–5.81)
RDW: 14.5 % (ref 11.5–15.5)
WBC: 4.7 10*3/uL (ref 4.0–10.5)

## 2019-05-07 LAB — TSH: TSH: 3.41 u[IU]/mL (ref 0.35–4.50)

## 2019-05-07 LAB — T4, FREE: Free T4: 1.04 ng/dL (ref 0.60–1.60)

## 2019-05-07 LAB — PROTIME-INR
INR: 1.1 ratio — ABNORMAL HIGH (ref 0.8–1.0)
Prothrombin Time: 12.8 s (ref 9.6–13.1)

## 2019-05-07 MED ORDER — SERTRALINE HCL 50 MG PO TABS: 50.0000 mg | ORAL_TABLET | Freq: Every day | ORAL | 1 refills | Status: DC

## 2019-05-07 NOTE — Patient Instructions (Addendum)
Flu shot today Labs today  Continue current medicines.  We will be in touch with results.  Look for glucerna protein supplements.

## 2019-05-07 NOTE — Progress Notes (Signed)
This visit was conducted in person.  BP (!) 142/70   Pulse 68   Temp (!) 97.5 F (36.4 C) (Temporal)   Wt 129 lb (58.5 kg)   SpO2 98%   BMI 19.61 kg/m    CC: 79mo f/u visit Subjective:    Patient ID: BERLON WIRTHLIN, male    DOB: Nov 24, 1934, 84 y.o.   MRN: [id number]  HPI: JAYMIER EDMUNDSON is a 84 y.o. male presenting on 05/07/2019 for Follow-up and Flu Vaccine   See prior note for details.   On omeprazole and ferrous sulfate QOD.   Hypothyroidism - levothyroxine 36mcg daily started 12/2018. He feels this has helped with energy levels.   Ongoing trouble gaining weight despite adequate PO intake. He has started walking more regularly.    Alcoholism - quit 07/2018, involved in [place] with sponsor (via zoom meeting) - doing well!!  MDD - feels doing well mood-wise. He did see [facility] x1, never heard back.   HTN - on accupril, accupril/hctz, amlodipine. Home BP readings well controlled.   DM - taking metformin 1000mg  bid. Overall well controlled at home. 110-130s fasting. Still eating sweets.  Lab Results  Component Value Date   HGBA1C 7.3 (H) 05/07/2019        Relevant past medical, surgical, family and social history reviewed and updated as indicated. Interim medical history since our last visit reviewed. Allergies and medications reviewed and updated. Outpatient Medications Prior to Visit  Medication Sig Dispense Refill  . amLODipine (NORVASC) 10 MG tablet TAKE 1 TABLET BY MOUTH  DAILY 90 tablet 2  . atorvastatin (LIPITOR) 10 MG tablet TAKE ONE-HALF TABLET BY  MOUTH DAILY AT 6 PM. 45 tablet 2  . ferrous sulfate 325 (65 FE) MG tablet Take 1 tablet (325 mg total) by mouth every other day.  3  . finasteride (PROSCAR) 5 MG tablet TAKE 1 TABLET BY MOUTH IN  THE EVENING 90 tablet 2  . fluorouracil (EFUDEX) 5 % cream APPLY ON THE SKIN TWICE A DAY FOR 2 WEEKS.    . levothyroxine (SYNTHROID) 25 MCG tablet Take 1 tablet (25 mcg total) by mouth daily before breakfast. 30 tablet 6   . metFORMIN (GLUCOPHAGE XR) 500 MG 24 hr tablet Take 2 tablets (1,000 mg total) by mouth 2 (two) times a day. 360 tablet 1  . Multiple Vitamin (MULTIVITAMIN WITH MINERALS) TABS Take 1 tablet by mouth daily.    Marland Kitchen omeprazole (PRILOSEC) 40 MG capsule Take 40 mg by mouth every other day.     . quinapril (ACCUPRIL) 20 MG tablet TAKE 1 TABLET BY MOUTH AT  BEDTIME 90 tablet 2  . quinapril-hydrochlorothiazide (ACCURETIC) 20-25 MG tablet TAKE 1 TABLET BY MOUTH  DAILY 90 tablet 2  . sertraline (ZOLOFT) 100 MG tablet TAKE 1 TABLET BY MOUTH  DAILY 90 tablet 2   No facility-administered medications prior to visit.      Per HPI unless specifically indicated in ROS section below Review of Systems Objective:    BP (!) 142/70   Pulse 68   Temp (!) 97.5 F (36.4 C) (Temporal)   Wt 129 lb (58.5 kg)   SpO2 98%   BMI 19.61 kg/m   Wt Readings from Last 3 Encounters:  05/07/19 129 lb (58.5 kg)  02/09/19 132 lb 12 oz (60.2 kg)  12/31/18 131 lb (59.4 kg)    Physical Exam Vitals signs and nursing note reviewed.  Constitutional:      General: He is not in acute  distress.    Appearance: Normal appearance. He is underweight. He is not ill-appearing.  HENT:     Mouth/Throat:     Mouth: Mucous membranes are moist.     Pharynx: Oropharynx is clear. No oropharyngeal exudate.  Eyes:     General: No scleral icterus.    Extraocular Movements: Extraocular movements intact.     Pupils: Pupils are equal, round, and reactive to light.  Cardiovascular:     Rate and Rhythm: Normal rate and regular rhythm.     Pulses: Normal pulses.     Heart sounds: Normal heart sounds. No murmur.  Pulmonary:     Effort: Pulmonary effort is normal. No respiratory distress.     Breath sounds: Normal breath sounds. No wheezing, rhonchi or rales.  Abdominal:     General: Abdomen is flat. Bowel sounds are normal. There is no distension.     Palpations: Abdomen is soft. There is no mass.     Tenderness: There is no abdominal  tenderness. There is no guarding or rebound.     Hernia: No hernia is present.  Musculoskeletal:     Right lower leg: No edema.     Left lower leg: No edema.  Skin:    General: Skin is warm and dry.     Coloration: Skin is not jaundiced.     Findings: No rash.  Neurological:     Mental Status: He is alert.  Psychiatric:        Mood and Affect: Mood normal.        Behavior: Behavior normal.       Results for orders placed or performed in visit on 05/07/19  Comprehensive metabolic panel  Result Value Ref Range   Sodium 138 135 - 145 mEq/L   Potassium 4.4 3.5 - 5.1 mEq/L   Chloride 99 96 - 112 mEq/L   CO2 33 (H) 19 - 32 mEq/L   Glucose, Bld 106 (H) 70 - 99 mg/dL   BUN 20 6 - 23 mg/dL   Creatinine, Ser 0.75 0.40 - 1.50 mg/dL   Total Bilirubin 0.5 0.2 - 1.2 mg/dL   Alkaline Phosphatase 141 (H) 39 - 117 U/L   AST 111 (H) 0 - 37 U/L   ALT 103 (H) 0 - 53 U/L   Total Protein 7.0 6.0 - 8.3 g/dL   Albumin 4.7 3.5 - 5.2 g/dL   Calcium 9.9 8.4 - 10.5 mg/dL   GFR 99.73 >60.00 mL/min  TSH  Result Value Ref Range   TSH 3.41 0.35 - 4.50 uIU/mL  T4, free  Result Value Ref Range   Free T4 1.04 0.60 - 1.60 ng/dL  Hemoglobin A1c  Result Value Ref Range   Hgb A1c MFr Bld 7.3 (H) 4.6 - 6.5 %  CBC with Differential/Platelet  Result Value Ref Range   WBC 4.7 4.0 - 10.5 K/uL   RBC 4.39 4.22 - 5.81 Mil/uL   Hemoglobin 13.3 13.0 - 17.0 g/dL   HCT 39.4 39.0 - 52.0 %   MCV 89.8 78.0 - 100.0 fl   MCHC 33.7 30.0 - 36.0 g/dL   RDW 14.5 11.5 - 15.5 %   Platelets 208.0 150.0 - 400.0 K/uL   Neutrophils Relative % 62.6 43.0 - 77.0 %   Lymphocytes Relative 21.7 12.0 - 46.0 %   Monocytes Relative 12.5 (H) 3.0 - 12.0 %   Eosinophils Relative 2.5 0.0 - 5.0 %   Basophils Relative 0.7 0.0 - 3.0 %   Neutro Abs 2.9  1.4 - 7.7 K/uL   Lymphs Abs 1.0 0.7 - 4.0 K/uL   Monocytes Absolute 0.6 0.1 - 1.0 K/uL   Eosinophils Absolute 0.1 0.0 - 0.7 K/uL   Basophils Absolute 0.0 0.0 - 0.1 K/uL  Protime-INR   Result Value Ref Range   INR 1.1 (H) 0.8 - 1.0 ratio   Prothrombin Time 12.8 9.6 - 13.1 sec   Assessment & Plan:   Problem List Items Addressed This Visit    Weight loss    Some loss again noted today. Nadir weight remains 129 lbs. Update labs including thyroid.       Type 2 diabetes mellitus with other specified complication (HCC) - Primary    Update A1c today. He continues with metformin XR 1000mg  bid.       Relevant Orders   Hemoglobin A1c (Completed)   Transaminitis    Update LFTs. In alcohol history, concern for cirrhosis - consider updating abdominal imaging.        Relevant Orders   Comprehensive metabolic panel (Completed)   CBC with Differential/Platelet (Completed)   Protime-INR (Completed)   US Abdomen Complete   MDD (major depressive disorder), recurrent episode, moderate (HCC)    Stable period on sertraline 50mg  daily.  Saw counselor x1.       Relevant Medications   sertraline (ZOLOFT) 50 MG tablet   Iron deficiency anemia    Update CBC on QOD ferrous sulfate.       Borderline hypothyroidism    Update TFTs on levothyroxine 51mcg daily - he feels he has benefited from thyroid replacement.       Relevant Orders   TSH (Completed)   T4, free (Completed)   Alcoholic hepatitis without ascites    Update LFTs, if persistently elevated, repeat abdominal imaging.       Relevant Orders   US Abdomen Complete   Alcohol use disorder, severe, in sustained remission ([place])    Remains abstinent. Congratulated. Continues going to [place], has sponsor.        Other Visit Diagnoses    Need for immunization against influenza       Relevant Orders   Flu Vaccine QUAD High Dose(Fluad) (Completed)       Meds ordered this encounter  Medications  . sertraline (ZOLOFT) 50 MG tablet    Sig: Take 1 tablet (50 mg total) by mouth daily.    Dispense:  90 tablet    Refill:  1    Note new sig   Orders Placed This Encounter  Procedures  . US Abdomen Complete    Standing  Status:   Future    Standing Expiration Date:   07/09/2020    Order Specific Question:   Reason for Exam (SYMPTOM  OR DIAGNOSIS REQUIRED)    Answer:   transaminitis    Order Specific Question:   Preferred imaging location?    Answer:   [facility]  . Flu Vaccine QUAD High Dose(Fluad)  . Comprehensive metabolic panel  . TSH  . T4, free  . Hemoglobin A1c  . CBC with Differential/Platelet  . Protime-INR   Patient Instructions  Flu shot today Labs today  Continue current medicines.  We will be in touch with results.  Look for glucerna protein supplements.    Follow up plan: No follow-ups on file.  [name], MD

## 2019-05-10 NOTE — Assessment & Plan Note (Signed)
Update TFTs on levothyroxine 60mcg daily - he feels he has benefited from thyroid replacement.

## 2019-05-10 NOTE — Assessment & Plan Note (Signed)
Remains abstinent. Congratulated. Continues going to [place], has sponsor.

## 2019-05-10 NOTE — Assessment & Plan Note (Signed)
Update LFTs, if persistently elevated, repeat abdominal imaging.

## 2019-05-10 NOTE — Assessment & Plan Note (Signed)
Update CBC on QOD ferrous sulfate.

## 2019-05-10 NOTE — Assessment & Plan Note (Signed)
Update A1c today. He continues with metformin XR 1000mg  bid.

## 2019-05-10 NOTE — Assessment & Plan Note (Addendum)
Stable period on sertraline 50mg  daily.  Saw counselor x1.

## 2019-05-10 NOTE — Assessment & Plan Note (Addendum)
Some loss again noted today. Nadir weight remains 129 lbs. Update labs including thyroid.

## 2019-05-10 NOTE — Assessment & Plan Note (Addendum)
Update LFTs. In alcohol history, concern for cirrhosis - consider updating abdominal imaging.

## 2019-05-13 ENCOUNTER — Ambulatory Visit
Admission: RE | Admit: 2019-05-13 | Discharge: 2019-05-13 | Disposition: A | Discharge status: Home / Self Care | Payer: Medicare Other | Source: Ambulatory Visit | Attending: Family Medicine | Admitting: Family Medicine

## 2019-05-13 ENCOUNTER — Other Ambulatory Visit: Payer: Medicare Other

## 2019-05-13 DIAGNOSIS — R7401 Elevation of levels of liver transaminase levels: Secondary | ICD-10-CM

## 2019-05-13 DIAGNOSIS — K7010 Alcoholic hepatitis without ascites: Secondary | ICD-10-CM

## 2019-05-18 ENCOUNTER — Telehealth: Payer: Self-pay

## 2019-05-18 NOTE — Telephone Encounter (Signed)
Spoke with pt relaying results of [place] and notifying him results were mailed.  Verbalizes understanding.

## 2019-05-18 NOTE — Telephone Encounter (Signed)
[place] Night - Client Nonclinical Telephone Record AccessNurse Client [place] Night - Client Client Site [place] Physician [name] - MD Contact Type Call Who Is Calling Patient / Member / Family / Caregiver Caller Name Arnis Nakanishi Caller Phone Number [phone] Call Type Message Only Information Provided Reason for Call Returning a Call from the Office Initial [place] states he missed call from office Additional Comment Call Closed By: Ignacia Marvel Transaction Date/Time: 05/17/2019 6:05:55 PM (ET)

## 2019-05-20 ENCOUNTER — Other Ambulatory Visit: Payer: Self-pay | Admitting: Family Medicine

## 2019-07-27 ENCOUNTER — Other Ambulatory Visit: Payer: Self-pay | Admitting: Family Medicine

## 2019-07-29 ENCOUNTER — Other Ambulatory Visit: Payer: Self-pay

## 2019-07-29 MED ORDER — LEVOTHYROXINE SODIUM 25 MCG PO TABS: 25.0000 ug | ORAL_TABLET | Freq: Every day | ORAL | 0 refills | Status: DC

## 2019-07-29 MED ORDER — OMEPRAZOLE 40 MG PO CPDR: 40.0000 mg | DELAYED_RELEASE_CAPSULE | ORAL | 0 refills | Status: DC

## 2019-07-29 NOTE — Telephone Encounter (Signed)
E-scribed refills.  

## 2019-08-02 ENCOUNTER — Other Ambulatory Visit: Payer: Self-pay | Admitting: Family Medicine

## 2019-08-09 ENCOUNTER — Other Ambulatory Visit: Payer: Self-pay | Admitting: Family Medicine

## 2019-08-13 ENCOUNTER — Other Ambulatory Visit: Payer: Self-pay | Admitting: Family Medicine

## 2019-08-13 ENCOUNTER — Ambulatory Visit: Payer: Medicare Other | Attending: Internal Medicine

## 2019-08-13 ENCOUNTER — Other Ambulatory Visit: Payer: Self-pay

## 2019-08-13 DIAGNOSIS — Z23 Encounter for immunization: Secondary | ICD-10-CM | POA: Insufficient documentation

## 2019-08-13 MED ORDER — LEVOTHYROXINE SODIUM 25 MCG PO TABS: 25.0000 ug | ORAL_TABLET | Freq: Every day | ORAL | 0 refills | Status: DC

## 2019-08-13 NOTE — Telephone Encounter (Signed)
patient l/m on triage line asking for refill on thyroid [facility] to [facility] on [address]. I sent this in. I called patient and left detailed message advising patient of this and also advised patient that we send this RX in to mail order pharmacy in December and not sure if he received that supply. Advised patient to call us back if he had any other questions or concerns.

## 2019-08-13 NOTE — Progress Notes (Signed)
   Covid-19 Vaccination Clinic  Name:  Justin Benson    MRN: [id number] DOB: [birth year]  08/13/2019  Justin Benson was observed post Covid-19 immunization for 15 minutes without incidence. He was provided with Vaccine Information Sheet and instruction to access the V-Safe system.   Justin Benson was instructed to call 911 with any severe reactions post vaccine: Marland Kitchen Difficulty breathing  . Swelling of your face and throat  . A fast heartbeat  . A bad rash all over your body  . Dizziness and weakness    Immunizations Administered    Name Date Dose VIS Date Route   Pfizer COVID-19 Vaccine 08/13/2019 10:38 AM 0.3 mL 07/09/2019 Intramuscular   Manufacturer: [employer], [employer]   Lot: [id number]   [place]: [id number]

## 2019-08-30 ENCOUNTER — Telehealth: Payer: Self-pay

## 2019-08-30 NOTE — Telephone Encounter (Signed)
[place] Night - Client Nonclinical Telephone Record AccessNurse Client [facility] Night - Client Client Site [place] Physician [name] - MD Contact Type Call Who Is Calling Patient / Member / Family / Caregiver Caller Name Justin Benson Caller Phone Number [phone] Call Type Message Only Information Provided Reason for Call Returning a Call from the Office Initial [place] states they got a message from the office today. Additional Comment Disp. Time Disposition Final User 08/27/2019 5:08:04 PM General Information Provided Yes Justin Benson

## 2019-08-30 NOTE — Telephone Encounter (Signed)
LVM for pt to call back. Not sure who called pt. Need to discuss with pt who may have called and was he expecting a call for any reason.

## 2019-08-31 NOTE — Telephone Encounter (Addendum)
It may have been a reminder call about 2nd part of wellness visit on 09/06/19.  Outside of that, I don't see any messages or recent results needing to be relayed.

## 2019-09-01 ENCOUNTER — Ambulatory Visit: Payer: Medicare Other

## 2019-09-01 ENCOUNTER — Other Ambulatory Visit: Payer: Self-pay | Admitting: Family Medicine

## 2019-09-01 DIAGNOSIS — N400 Enlarged prostate without lower urinary tract symptoms: Secondary | ICD-10-CM

## 2019-09-01 DIAGNOSIS — K7010 Alcoholic hepatitis without ascites: Secondary | ICD-10-CM

## 2019-09-01 DIAGNOSIS — E1169 Type 2 diabetes mellitus with other specified complication: Secondary | ICD-10-CM

## 2019-09-01 DIAGNOSIS — D500 Iron deficiency anemia secondary to blood loss (chronic): Secondary | ICD-10-CM

## 2019-09-01 DIAGNOSIS — E039 Hypothyroidism, unspecified: Secondary | ICD-10-CM

## 2019-09-01 DIAGNOSIS — E785 Hyperlipidemia, unspecified: Secondary | ICD-10-CM

## 2019-09-02 ENCOUNTER — Other Ambulatory Visit (INDEPENDENT_AMBULATORY_CARE_PROVIDER_SITE_OTHER): Payer: Medicare Other

## 2019-09-02 ENCOUNTER — Other Ambulatory Visit: Payer: Self-pay

## 2019-09-02 ENCOUNTER — Ambulatory Visit: Payer: Medicare Other | Attending: Internal Medicine

## 2019-09-02 DIAGNOSIS — D500 Iron deficiency anemia secondary to blood loss (chronic): Secondary | ICD-10-CM | POA: Diagnosis not present

## 2019-09-02 DIAGNOSIS — E1169 Type 2 diabetes mellitus with other specified complication: Secondary | ICD-10-CM | POA: Diagnosis not present

## 2019-09-02 DIAGNOSIS — E039 Hypothyroidism, unspecified: Secondary | ICD-10-CM

## 2019-09-02 DIAGNOSIS — E785 Hyperlipidemia, unspecified: Secondary | ICD-10-CM

## 2019-09-02 DIAGNOSIS — Z23 Encounter for immunization: Secondary | ICD-10-CM

## 2019-09-02 LAB — COMPREHENSIVE METABOLIC PANEL WITH GFR
ALT: 156 U/L — ABNORMAL HIGH (ref 0–53)
AST: 163 U/L — ABNORMAL HIGH (ref 0–37)
Albumin: 4.4 g/dL (ref 3.5–5.2)
Alkaline Phosphatase: 134 U/L — ABNORMAL HIGH (ref 39–117)
BUN: 20 mg/dL (ref 6–23)
CO2: 33 meq/L — ABNORMAL HIGH (ref 19–32)
Calcium: 9.5 mg/dL (ref 8.4–10.5)
Chloride: 100 meq/L (ref 96–112)
Creatinine, Ser: 0.8 mg/dL (ref 0.40–1.50)
GFR: 92.5 mL/min
Glucose, Bld: 151 mg/dL — ABNORMAL HIGH (ref 70–99)
Potassium: 4.5 meq/L (ref 3.5–5.1)
Sodium: 137 meq/L (ref 135–145)
Total Bilirubin: 0.5 mg/dL (ref 0.2–1.2)
Total Protein: 6.8 g/dL (ref 6.0–8.3)

## 2019-09-02 LAB — LIPID PANEL
Cholesterol: 142 mg/dL (ref 0–200)
HDL: 58 mg/dL
LDL Cholesterol: 63 mg/dL (ref 0–99)
NonHDL: 83.66
Total CHOL/HDL Ratio: 2
Triglycerides: 101 mg/dL (ref 0.0–149.0)
VLDL: 20.2 mg/dL (ref 0.0–40.0)

## 2019-09-02 LAB — CBC WITH DIFFERENTIAL/PLATELET
Basophils Absolute: 0 10*3/uL (ref 0.0–0.1)
Basophils Relative: 0.7 % (ref 0.0–3.0)
Eosinophils Absolute: 0.1 10*3/uL (ref 0.0–0.7)
Eosinophils Relative: 2.1 % (ref 0.0–5.0)
HCT: 40.8 % (ref 39.0–52.0)
Hemoglobin: 13.4 g/dL (ref 13.0–17.0)
Lymphocytes Relative: 29.3 % (ref 12.0–46.0)
Lymphs Abs: 1.2 10*3/uL (ref 0.7–4.0)
MCHC: 32.8 g/dL (ref 30.0–36.0)
MCV: 91.1 fl (ref 78.0–100.0)
Monocytes Absolute: 0.6 10*3/uL (ref 0.1–1.0)
Monocytes Relative: 14.8 % — ABNORMAL HIGH (ref 3.0–12.0)
Neutro Abs: 2.2 10*3/uL (ref 1.4–7.7)
Neutrophils Relative %: 53.1 % (ref 43.0–77.0)
Platelets: 207 10*3/uL (ref 150.0–400.0)
RBC: 4.48 Mil/uL (ref 4.22–5.81)
RDW: 14.8 % (ref 11.5–15.5)
WBC: 4.2 10*3/uL (ref 4.0–10.5)

## 2019-09-02 LAB — FERRITIN: Ferritin: 38 ng/mL (ref 22.0–322.0)

## 2019-09-02 LAB — IBC PANEL
Iron: 81 ug/dL (ref 42–165)
Saturation Ratios: 19.5 % — ABNORMAL LOW (ref 20.0–50.0)
Transferrin: 296 mg/dL (ref 212.0–360.0)

## 2019-09-02 LAB — HEMOGLOBIN A1C: Hgb A1c MFr Bld: 7.4 % — ABNORMAL HIGH (ref 4.6–6.5)

## 2019-09-02 LAB — TSH: TSH: 3.64 u[IU]/mL (ref 0.35–4.50)

## 2019-09-02 NOTE — Progress Notes (Signed)
   Covid-19 Vaccination Clinic  Name:  Justin Benson    MRN: [id number] DOB: 03/12/1935  09/02/2019  [name] was observed post Covid-19 immunization for 15 minutes without incidence. He was provided with Vaccine Information Sheet and instruction to access the V-Safe system.   [name] was instructed to call 911 with any severe reactions post vaccine: Marland Kitchen Difficulty breathing  . Swelling of your face and throat  . A fast heartbeat  . A bad rash all over your body  . Dizziness and weakness    Immunizations Administered    Name Date Dose VIS Date Route   Pfizer COVID-19 Vaccine 09/02/2019 11:33 AM 0.3 mL 07/09/2019 Intramuscular   Manufacturer: [place]   Lot: [id number]   [place]: [id number]

## 2019-09-06 ENCOUNTER — Other Ambulatory Visit: Payer: Self-pay

## 2019-09-06 ENCOUNTER — Encounter: Payer: Self-pay | Admitting: Family Medicine

## 2019-09-06 ENCOUNTER — Telehealth: Payer: Self-pay | Admitting: Family Medicine

## 2019-09-06 ENCOUNTER — Ambulatory Visit (INDEPENDENT_AMBULATORY_CARE_PROVIDER_SITE_OTHER): Payer: Medicare Other | Admitting: Family Medicine

## 2019-09-06 VITALS — BP 136/62 | HR 65 | Temp 97.8°F | Ht 67.0 in | Wt 132.6 lb

## 2019-09-06 DIAGNOSIS — R110 Nausea: Secondary | ICD-10-CM

## 2019-09-06 DIAGNOSIS — R7401 Elevation of levels of liver transaminase levels: Secondary | ICD-10-CM

## 2019-09-06 DIAGNOSIS — E039 Hypothyroidism, unspecified: Secondary | ICD-10-CM

## 2019-09-06 DIAGNOSIS — R112 Nausea with vomiting, unspecified: Secondary | ICD-10-CM

## 2019-09-06 DIAGNOSIS — I700 Atherosclerosis of aorta: Secondary | ICD-10-CM

## 2019-09-06 DIAGNOSIS — J449 Chronic obstructive pulmonary disease, unspecified: Secondary | ICD-10-CM

## 2019-09-06 DIAGNOSIS — N400 Enlarged prostate without lower urinary tract symptoms: Secondary | ICD-10-CM

## 2019-09-06 DIAGNOSIS — D500 Iron deficiency anemia secondary to blood loss (chronic): Secondary | ICD-10-CM

## 2019-09-06 DIAGNOSIS — R1310 Dysphagia, unspecified: Secondary | ICD-10-CM

## 2019-09-06 DIAGNOSIS — F1021 Alcohol dependence, in remission: Secondary | ICD-10-CM

## 2019-09-06 DIAGNOSIS — N281 Cyst of kidney, acquired: Secondary | ICD-10-CM

## 2019-09-06 DIAGNOSIS — E1169 Type 2 diabetes mellitus with other specified complication: Secondary | ICD-10-CM

## 2019-09-06 DIAGNOSIS — Z0000 Encounter for general adult medical examination without abnormal findings: Secondary | ICD-10-CM

## 2019-09-06 DIAGNOSIS — F331 Major depressive disorder, recurrent, moderate: Secondary | ICD-10-CM

## 2019-09-06 DIAGNOSIS — I10 Essential (primary) hypertension: Secondary | ICD-10-CM

## 2019-09-06 DIAGNOSIS — K7010 Alcoholic hepatitis without ascites: Secondary | ICD-10-CM

## 2019-09-06 DIAGNOSIS — R0989 Other specified symptoms and signs involving the circulatory and respiratory systems: Secondary | ICD-10-CM

## 2019-09-06 MED ORDER — OMEPRAZOLE 40 MG PO CPDR: 40.0000 mg | DELAYED_RELEASE_CAPSULE | Freq: Every day | ORAL | 3 refills | Status: DC

## 2019-09-06 NOTE — Assessment & Plan Note (Signed)
On statin.

## 2019-09-06 NOTE — Assessment & Plan Note (Signed)
Continue daily iron replacement.  

## 2019-09-06 NOTE — Assessment & Plan Note (Signed)

## 2019-09-06 NOTE — Assessment & Plan Note (Signed)
Chronic, stable. Continue current regimen. 

## 2019-09-06 NOTE — Assessment & Plan Note (Signed)
Asxs, not needing respiratory medication.

## 2019-09-06 NOTE — Assessment & Plan Note (Signed)
Congratulated on ongoing abstinence. Continues involvement in AA.

## 2019-09-06 NOTE — Assessment & Plan Note (Signed)
Persists. Abd [place] without evidence of cirrhosis.  Now with nausea and choking/aspiration.  Check CT abd to further evaluate liver.  Pending results likely will refer back to GI to consider rpt EGD.

## 2019-09-06 NOTE — Assessment & Plan Note (Signed)
Stable period on low dose sertraline - continue.  

## 2019-09-06 NOTE — Assessment & Plan Note (Signed)
BPH on finasteride. We have stopped prostate cancer screening but consider PSA check next labs to monitor finasteride use.

## 2019-09-06 NOTE — Progress Notes (Signed)
This visit was conducted in person.  BP 136/62 (BP Location: Left Arm, Patient Position: Sitting, Cuff Size: Normal)   Pulse 65   Temp 97.8 F (36.6 C) (Temporal)   Ht 5\' 7"  (1.702 m)   Wt 132 lb 9 oz (60.1 kg)   SpO2 97%   BMI 20.76 kg/m    CC: AMW f/u visit  Subjective:    Patient ID: Justin Benson, male    DOB: 1935/04/28, 85 y.o.   MRN: [id number]  HPI: Justin Benson is a 85 y.o. male presenting on 09/06/2019 for Medicare Wellness   Did not see health advisor   Hearing Screening   125Hz  250Hz  500Hz  1000Hz  2000Hz  3000Hz  4000Hz  6000Hz  8000Hz   Right ear:   40 40 40  0    Left ear:   40 20 40  0      Visual Acuity Screening   Right eye Left eye Both eyes  Without correction:     With correction: 20/40 20/40 20/30       Office Visit from 09/06/2019 in [place] [facility] [place]  PHQ-2 Total Score  1      Fall Risk  09/06/2019 08/28/2018 08/20/2017 06/10/2016 06/10/2016  Falls in the past year? 0 0 No No No  Comment - - - - -  Number falls in past yr: - - - - -  Injury with Fall? - - - - -  Comment - - - - -  Risk for fall due to : - - - - -  Risk for fall due to: Comment - - - - -      Alcohol abuse - quit 07/2018 - since then involved in [place] with sponsor!   Notes nausea in the mornings when he drinks his coffee, occasional emesis after. Can choke with liquids - feels liquids get strangled in left lower throat, aspirates at times. Abd [place] 04/2019 WNL. Appetite good but has trouble gaining weight. Continues omeprazole 40mg  QOD.   Planning to restart volunteering at local [place] - excited for this.   Has been eating more ice cream sandwiches recently.   Preventative: COLONOSCOPY Date: 02/2014 1 polyp, diverticulosis, no rpt needed [name]) Colonoscopy 01/2018 - 1 TA, no rpt needed [name])  EGD 01/2018 - mild gastritis [name]) Prostate cancer screening - strong fmhx prostate cancer. Personal h/o BPH on finasteride. Yearly checks - discussed, will stop  screening, consider PSA for h/o BPH.  Flu shotyearly (pharmacy).  Td 2011  Pneumovax 2004, prevnar 2015 zostavax 2012 shingrix - 10/2017, 01/2018  Covid pfizer vaccines x2 done 07/2019 Advanced directives:copy in chart 11/2013.HCPOA would be daughter, Danye Pogany in [place]. Would be ok with temporary measures - feeding tube, breathing tube, but doesn't want prolonged life support Seat belt use discussed Sunscreen use discussed. No changing moles on skin. Sees derm.  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     Relevant past medical, surgical, family and social history reviewed and updated as indicated. Interim medical history since our last visit reviewed. Allergies and medications reviewed and updated. Outpatient Medications Prior to Visit  Medication  Sig Dispense Refill  . amLODipine (NORVASC) 10 MG tablet TAKE 1 TABLET BY MOUTH  DAILY 90 tablet 2  . atorvastatin (LIPITOR) 10 MG tablet TAKE ONE-HALF TABLET BY  MOUTH DAILY AT 6 PM. 45 tablet 2  . ferrous sulfate 325 (65 FE) MG tablet Take 1 tablet (325 mg total) by mouth every other day.  3  . finasteride (PROSCAR) 5 MG tablet TAKE 1 TABLET BY MOUTH IN  THE EVENING 90 tablet 2  . levothyroxine (SYNTHROID) 25 MCG tablet Take 1 tablet (25 mcg total) by mouth daily before breakfast. 90 tablet 0  . metFORMIN (GLUCOPHAGE-XR) 500 MG 24 hr tablet TAKE 2 TABLETS BY MOUTH  TWICE DAILY 360 tablet 1  . Multiple Vitamin (MULTIVITAMIN WITH MINERALS) TABS Take 1 tablet by mouth daily.    . quinapril (ACCUPRIL) 20  MG tablet TAKE 1 TABLET BY MOUTH AT  BEDTIME 90 tablet 2  . quinapril-hydrochlorothiazide (ACCURETIC) 20-25 MG tablet TAKE 1 TABLET BY MOUTH  DAILY 90 tablet 2  . sertraline (ZOLOFT) 50 MG tablet TAKE 1 TABLET BY MOUTH  DAILY 90 tablet 0  . omeprazole (PRILOSEC) 40 MG capsule Take 1 capsule (40 mg total) by mouth every other day. 90 capsule 0  . fluorouracil (EFUDEX) 5 % cream APPLY ON THE SKIN TWICE A DAY FOR 2 WEEKS.     No facility-administered medications prior to visit.     Per HPI unless specifically indicated in ROS section below Review of Systems  Constitutional: Negative for activity change, appetite change, chills, fatigue, fever and unexpected weight change.  HENT: Negative for hearing loss.   Eyes: Negative for visual disturbance.  Respiratory: Positive for cough (throat clearing). Negative for chest tightness, shortness of breath and wheezing.   Cardiovascular: Negative for chest pain, palpitations and leg swelling.  Gastrointestinal: Positive for nausea and vomiting (rare). Negative for abdominal distention, abdominal pain, blood in stool, constipation and diarrhea.       Some choking to liquids  Genitourinary: Negative for difficulty urinating and hematuria.  Musculoskeletal: Negative for arthralgias, myalgias and neck pain.  Skin: Negative for rash.  Neurological: Positive for dizziness (feels woozy) and headaches (fleeting throughout head). Negative for seizures and syncope.  Hematological: Negative for adenopathy. Does not bruise/bleed easily.  Psychiatric/Behavioral: Negative for dysphoric mood. The patient is not nervous/anxious.    Objective:    BP 136/62 (BP Location: Left Arm, Patient Position: Sitting, Cuff Size: Normal)   Pulse 65   Temp 97.8 F (36.6 C) (Temporal)   Ht 5\' 7"  (1.702 m)   Wt 132 lb 9 oz (60.1 kg)   SpO2 97%   BMI 20.76 kg/m   Wt Readings from Last 3 Encounters:  09/06/19 132 lb 9 oz (60.1 kg)  05/07/19 129 lb (58.5 kg)  02/09/19 132 lb  12 oz (60.2 kg)    Physical Exam Vitals and nursing note reviewed.  Constitutional:      General: He is not in acute distress.    Appearance: Normal appearance. He is well-developed. He is not ill-appearing.  HENT:     Head: Normocephalic and atraumatic.     Right Ear: Hearing, tympanic membrane, ear canal and external ear normal.     Left Ear: Hearing, tympanic membrane, ear canal and external ear normal.     Mouth/Throat:     Mouth: Mucous membranes are moist.     Pharynx: Oropharynx is clear. Uvula midline. No oropharyngeal exudate or posterior oropharyngeal erythema.  Eyes:  General: No scleral icterus.    Extraocular Movements: Extraocular movements intact.     Conjunctiva/sclera: Conjunctivae normal.     Pupils: Pupils are equal, round, and reactive to light.  Neck:     Thyroid: No thyromegaly or thyroid tenderness.     Vascular: Carotid bruit (L bruit) present.  Cardiovascular:     Rate and Rhythm: Normal rate and regular rhythm.     Pulses:          Radial pulses are 2+ on the right side and 2+ on the left side.     Heart sounds: Normal heart sounds. No murmur.  Pulmonary:     Effort: Pulmonary effort is normal. No respiratory distress.     Breath sounds: Normal breath sounds. No wheezing or rales.  Abdominal:     General: Abdomen is flat. Bowel sounds are normal. There is no distension.     Palpations: Abdomen is soft. There is no mass.     Tenderness: There is no abdominal tenderness. There is no guarding or rebound.     Hernia: No hernia is present.  Musculoskeletal:        General: Normal range of motion.     Cervical back: Normal range of motion and neck supple.  Lymphadenopathy:     Cervical: No cervical adenopathy.  Skin:    General: Skin is warm and dry.     Findings: No rash.  Neurological:     Mental Status: He is alert and oriented to person, place, and time.     Comments:  CN grossly intact, station and gait intact Recall 2/3, 3/3 with  cue Calculation 3/5 DLROW  Psychiatric:        Behavior: Behavior normal.        Thought Content: Thought content normal.        Judgment: Judgment normal.       Results for orders placed or performed in visit on 09/02/19  IBC panel  Result Value Ref Range   Iron 81 42 - 165 ug/dL   Transferrin 296.0 212.0 - 360.0 mg/dL   Saturation Ratios 19.5 (L) 20.0 - 50.0 %  Ferritin  Result Value Ref Range   Ferritin 38.0 22.0 - 322.0 ng/mL  CBC with Differential/Platelet  Result Value Ref Range   WBC 4.2 4.0 - 10.5 K/uL   RBC 4.48 4.22 - 5.81 Mil/uL   Hemoglobin 13.4 13.0 - 17.0 g/dL   HCT 40.8 39.0 - 52.0 %   MCV 91.1 78.0 - 100.0 fl   MCHC 32.8 30.0 - 36.0 g/dL   RDW 14.8 11.5 - 15.5 %   Platelets 207.0 150.0 - 400.0 K/uL   Neutrophils Relative % 53.1 43.0 - 77.0 %   Lymphocytes Relative 29.3 12.0 - 46.0 %   Monocytes Relative 14.8 (H) 3.0 - 12.0 %   Eosinophils Relative 2.1 0.0 - 5.0 %   Basophils Relative 0.7 0.0 - 3.0 %   Neutro Abs 2.2 1.4 - 7.7 K/uL   Lymphs Abs 1.2 0.7 - 4.0 K/uL   Monocytes Absolute 0.6 0.1 - 1.0 K/uL   Eosinophils Absolute 0.1 0.0 - 0.7 K/uL   Basophils Absolute 0.0 0.0 - 0.1 K/uL  Hemoglobin A1c  Result Value Ref Range   Hgb A1c MFr Bld 7.4 (H) 4.6 - 6.5 %  TSH  Result Value Ref Range   TSH 3.64 0.35 - 4.50 uIU/mL  Comprehensive metabolic panel  Result Value Ref Range   Sodium 137 135 - 145 mEq/L  Potassium 4.5 3.5 - 5.1 mEq/L   Chloride 100 96 - 112 mEq/L   CO2 33 (H) 19 - 32 mEq/L   Glucose, Bld 151 (H) 70 - 99 mg/dL   BUN 20 6 - 23 mg/dL   Creatinine, Ser 0.80 0.40 - 1.50 mg/dL   Total Bilirubin 0.5 0.2 - 1.2 mg/dL   Alkaline Phosphatase 134 (H) 39 - 117 U/L   AST 163 (H) 0 - 37 U/L   ALT 156 (H) 0 - 53 U/L   Total Protein 6.8 6.0 - 8.3 g/dL   Albumin 4.4 3.5 - 5.2 g/dL   GFR 92.50 >60.00 mL/min   Calcium 9.5 8.4 - 10.5 mg/dL  Lipid panel  Result Value Ref Range   Cholesterol 142 0 - 200 mg/dL   Triglycerides 101.0 0.0 - 149.0  mg/dL   HDL 58.00 >39.00 mg/dL   VLDL 20.2 0.0 - 40.0 mg/dL   LDL Cholesterol 63 0 - 99 mg/dL   Total CHOL/HDL Ratio 2    NonHDL 83.66    Assessment & Plan:  This visit occurred during the SARS-CoV-2 public health emergency.  Safety protocols were in place, including screening questions prior to the visit, additional usage of staff PPE, and extensive cleaning of exam room while observing appropriate contact time as indicated for disinfecting solutions.   Problem List Items Addressed This Visit    Type 2 diabetes mellitus with other specified complication ([place])    123456 adequate but could do better. Will work towards low sugar low carb diabetic diet and reassess at f/u visit 3-4 mo.      Transaminitis    Persists. Abd [place] without evidence of cirrhosis.  Now with nausea and choking/aspiration.  Check CT abd to further evaluate liver.  Pending results likely will refer back to GI to consider rpt EGD.      Relevant Orders   Ambulatory referral to Gastroenterology   Nausea    Nausea to caffeine.  Increase PPI to daily. With persistent transaminitis, check CT abd to further evaluate liver.       Medicare annual wellness visit, subsequent - Primary    I have personally reviewed the Medicare Annual Wellness questionnaire and have noted 1. The patient's medical and social history 2. Their use of alcohol, tobacco or illicit drugs 3. Their current medications and supplements 4. The patient's functional ability including ADL's, fall risks, home safety risks and hearing or visual impairment. Cognitive function has been assessed and addressed as indicated.  5. Diet and physical activity 6. Evidence for depression or mood disorders The patients weight, height, BMI have been recorded in the chart. I have made referrals, counseling and provided education to the patient based on review of the above and I have provided the pt with a written personalized care plan for preventive services. Provider  list updated.. See scanned questionairre as needed for further documentation. Reviewed preventative protocols and updated unless pt declined.       MDD (major depressive disorder), recurrent episode, moderate (HCC)    Stable period on low dose sertraline - continue.      Kidney cyst, acquired   Iron deficiency anemia    Continue daily iron replacement.       Hyperlipidemia associated with type 2 diabetes mellitus (HCC)    Chronic, stable on low dose lipitor - continue. The ASCVD Risk score Mikey Bussing DC Jr., et al., 2013) failed to calculate for the following reasons:   The 2013 ASCVD risk score is only  valid for ages 69 to 39       Health maintenance examination    Preventative protocols reviewed and updated unless pt declined. Discussed healthy diet and lifestyle.       Essential hypertension    Chronic, stable. Continue current regimen.       Dysphagia    Liquid dysphagia of recent start. Benign HEENT exam. Check CXR and abd CT. Likely will refer to GI.       Relevant Orders   Ambulatory referral to Gastroenterology   DG Chest 2 View   COPD (chronic obstructive pulmonary disease) ([place])    Asxs, not needing respiratory medication.       Borderline hypothyroidism    Stable period on low dose levothyroxine - continue.       Benign prostatic hyperplasia    BPH on finasteride. We have stopped prostate cancer screening but consider PSA check next labs to monitor finasteride use.       Alcoholic hepatitis without ascites   Alcohol use disorder, severe, in sustained remission ([place])    Congratulated on ongoing abstinence. Continues involvement in AA.      Abdominal aortic atherosclerosis ([place])    On statin.        Other Visit Diagnoses    Left carotid bruit       Relevant Orders   VAS US CAROTID   Non-intractable vomiting with nausea, unspecified vomiting type       Relevant Orders   CT ABDOMEN W WO CONTRAST       No orders of the defined types were placed in  this encounter.  Orders Placed This Encounter  Procedures  . CT ABDOMEN W WO CONTRAST    Standing Status:   Future    Standing Expiration Date:   12/03/2020    Order Specific Question:   ** REASON FOR EXAM (FREE TEXT)    Answer:   persistent transaminitis with nausea and liquid dysphagia    Order Specific Question:   If indicated for the ordered procedure, I authorize the administration of contrast media per Radiology protocol    Answer:   Yes    Order Specific Question:   Preferred imaging location?    Answer:   [facility]    Order Specific Question:   Is Oral Contrast requested for this exam?    Answer:   Yes, Per Radiology protocol    Order Specific Question:   Radiology Contrast Protocol - do NOT remove file path    Answer:   \\[email or web address].pdf  . DG Chest 2 View    Standing Status:   Future    Standing Expiration Date:   11/03/2020    Order Specific Question:   Reason for Exam (SYMPTOM  OR DIAGNOSIS REQUIRED)    Answer:   dysphagia    Order Specific Question:   Preferred imaging location?    Answer:   [facility]    Order Specific Question:   Radiology Contrast Protocol - do NOT remove file path    Answer:   \\[email or web address]  . Ambulatory referral to Gastroenterology    Referral Priority:   Routine    Referral Type:   Consultation    Referral Reason:   Specialty Services Required    Number of Visits Requested:   1    Patient instructions: We will check carotid ultrasound for possible narrowing on left I will order CT of abdomen to further evaluate nausea and elevated liver function Good to see you today  Return as needed or in 4 months for diabetes check.   Follow up plan: Return in about 4 months (around 01/04/2020) for follow up visit.  [name], MD

## 2019-09-06 NOTE — Assessment & Plan Note (Addendum)
Nausea to caffeine.  Increase PPI to daily. With persistent transaminitis, check CT abd to further evaluate liver.

## 2019-09-06 NOTE — Assessment & Plan Note (Signed)
Liquid dysphagia of recent start. Benign HEENT exam. Check CXR and abd CT. Likely will refer to GI.

## 2019-09-06 NOTE — Assessment & Plan Note (Signed)
Chronic, stable on low dose lipitor - continue. The ASCVD Risk score Justin Bussing DC Jr., et al., 2013) failed to calculate for the following reasons:   The 2013 ASCVD risk score is only valid for ages 83 to 47

## 2019-09-06 NOTE — Patient Instructions (Addendum)
We will check carotid ultrasound for possible narrowing on left I will order CT of abdomen to further evaluate nausea and elevated liver function Good to see you today Return as needed or in 4 months for diabetes check.   Health Maintenance After Age 83 After age 26, you are at a higher risk for certain long-term diseases and infections as well as injuries from falls. Falls are a major cause of broken bones and head injuries in people who are older than age 71. Getting regular preventive care can help to keep you healthy and well. Preventive care includes getting regular testing and making lifestyle changes as recommended by your health care provider. Talk with your health care provider about:  Which screenings and tests you should have. A screening is a test that checks for a disease when you have no symptoms.  A diet and exercise plan that is right for you. What should I know about screenings and tests to prevent falls? Screening and testing are the best ways to find a health problem early. Early diagnosis and treatment give you the best chance of managing medical conditions that are common after age 55. Certain conditions and lifestyle choices may make you more likely to have a fall. Your health care provider may recommend:  Regular vision checks. Poor vision and conditions such as cataracts can make you more likely to have a fall. If you wear glasses, make sure to get your prescription updated if your vision changes.  Medicine review. Work with your health care provider to regularly review all of the medicines you are taking, including over-the-counter medicines. Ask your health care provider about any side effects that may make you more likely to have a fall. Tell your health care provider if any medicines that you take make you feel dizzy or sleepy.  Osteoporosis screening. Osteoporosis is a condition that causes the bones to get weaker. This can make the bones weak and cause them to break  more easily.  Blood pressure screening. Blood pressure changes and medicines to control blood pressure can make you feel dizzy.  Strength and balance checks. Your health care provider may recommend certain tests to check your strength and balance while standing, walking, or changing positions.  Foot health exam. Foot pain and numbness, as well as not wearing proper footwear, can make you more likely to have a fall.  Depression screening. You may be more likely to have a fall if you have a fear of falling, feel emotionally low, or feel unable to do activities that you used to do.  Alcohol use screening. Using too much alcohol can affect your balance and may make you more likely to have a fall. What actions can I take to lower my risk of falls? General instructions  Talk with your health care provider about your risks for falling. Tell your health care provider if: ? You fall. Be sure to tell your health care provider about all falls, even ones that seem minor. ? You feel dizzy, sleepy, or off-balance.  Take over-the-counter and prescription medicines only as told by your health care provider. These include any supplements.  Eat a healthy diet and maintain a healthy weight. A healthy diet includes low-fat dairy products, low-fat (lean) meats, and fiber from whole grains, beans, and lots of fruits and vegetables. Home safety  Remove any tripping hazards, such as rugs, cords, and clutter.  Install safety equipment such as grab bars in bathrooms and safety rails on stairs.  Keep rooms  and walkways well-lit. Activity   Follow a regular exercise program to stay fit. This will help you maintain your balance. Ask your health care provider what types of exercise are appropriate for you.  If you need a cane or walker, use it as recommended by your health care provider.  Wear supportive shoes that have nonskid soles. Lifestyle  Do not drink alcohol if your health care provider tells you not  to drink.  If you drink alcohol, limit how much you have: ? 0-1 drink a day for women. ? 0-2 drinks a day for men.  Be aware of how much alcohol is in your drink. In the [place] one drink equals one typical bottle of beer (12 oz), one-half glass of wine (5 oz), or one shot of hard liquor (1 oz).  Do not use any products that contain nicotine or tobacco, such as cigarettes and e-cigarettes. If you need help quitting, ask your health care provider. Summary  Having a healthy lifestyle and getting preventive care can help to protect your health and wellness after age 12.  Screening and testing are the best way to find a health problem early and help you avoid having a fall. Early diagnosis and treatment give you the best chance for managing medical conditions that are more common for people who are older than age 82.  Falls are a major cause of broken bones and head injuries in people who are older than age 73. Take precautions to prevent a fall at home.  Work with your health care provider to learn what changes you can make to improve your health and wellness and to prevent falls. This information is not intended to replace advice given to you by your health care provider. Make sure you discuss any questions you have with your health care provider. Document Revised: 11/05/2018 Document Reviewed: 05/28/2017 Elsevier Patient Education  2020 [employer].

## 2019-09-06 NOTE — Assessment & Plan Note (Signed)
Preventative protocols reviewed and updated unless pt declined. Discussed healthy diet and lifestyle.  

## 2019-09-06 NOTE — Telephone Encounter (Signed)
Seen today. plz call - for choking episodes i'd like him to return at his convenience for chest xray to ensure lungs are ok.  I also will go ahead and refer back to GI for further evaluation of choking and would like him to increase omeprazole to 40mg  every day for now ([facility]).

## 2019-09-06 NOTE — Telephone Encounter (Signed)
Spoke with pt relaying Dr. Synthia Innocent message.  Pt verbalizes understanding.  Scheduled lab visit for CXR tomorrow at 2:15.

## 2019-09-06 NOTE — Assessment & Plan Note (Signed)
A1c adequate but could do better. Will work towards low sugar low carb diabetic diet and reassess at f/u visit 3-4 mo.

## 2019-09-06 NOTE — Assessment & Plan Note (Signed)
Stable period on low dose levothyroxine - continue.

## 2019-09-07 ENCOUNTER — Ambulatory Visit (INDEPENDENT_AMBULATORY_CARE_PROVIDER_SITE_OTHER)
Admission: RE | Admit: 2019-09-07 | Discharge: 2019-09-07 | Disposition: A | Discharge status: Home / Self Care | Payer: Medicare Other | Source: Ambulatory Visit | Attending: Family Medicine | Admitting: Family Medicine

## 2019-09-07 ENCOUNTER — Other Ambulatory Visit: Payer: Medicare Other

## 2019-09-07 DIAGNOSIS — R1310 Dysphagia, unspecified: Secondary | ICD-10-CM | POA: Diagnosis not present

## 2019-09-09 ENCOUNTER — Ambulatory Visit (HOSPITAL_COMMUNITY)
Admission: RE | Admit: 2019-09-09 | Discharge: 2019-09-09 | Disposition: A | Discharge status: Home / Self Care | Payer: Medicare Other | Source: Ambulatory Visit | Attending: Cardiovascular Disease | Admitting: Cardiovascular Disease

## 2019-09-09 ENCOUNTER — Encounter: Payer: Self-pay | Admitting: Family Medicine

## 2019-09-09 ENCOUNTER — Other Ambulatory Visit: Payer: Self-pay

## 2019-09-09 DIAGNOSIS — R0989 Other specified symptoms and signs involving the circulatory and respiratory systems: Secondary | ICD-10-CM | POA: Diagnosis not present

## 2019-09-09 DIAGNOSIS — J849 Interstitial pulmonary disease, unspecified: Secondary | ICD-10-CM | POA: Insufficient documentation

## 2019-09-10 ENCOUNTER — Ambulatory Visit (INDEPENDENT_AMBULATORY_CARE_PROVIDER_SITE_OTHER)
Admission: RE | Admit: 2019-09-10 | Discharge: 2019-09-10 | Disposition: A | Discharge status: Home / Self Care | Payer: Medicare Other | Source: Ambulatory Visit | Attending: Family Medicine | Admitting: Family Medicine

## 2019-09-10 DIAGNOSIS — R112 Nausea with vomiting, unspecified: Secondary | ICD-10-CM

## 2019-09-10 DIAGNOSIS — K769 Liver disease, unspecified: Secondary | ICD-10-CM

## 2019-09-10 MED ORDER — IOHEXOL 300 MG/ML  SOLN
100.0000 mL | Freq: Once | INTRAMUSCULAR | Status: AC | PRN
  Administered 2019-09-10: 10:00:00 100 mL via INTRAVENOUS

## 2019-09-12 ENCOUNTER — Encounter: Payer: Self-pay | Admitting: Family Medicine

## 2019-09-12 DIAGNOSIS — I6529 Occlusion and stenosis of unspecified carotid artery: Secondary | ICD-10-CM | POA: Insufficient documentation

## 2019-09-22 ENCOUNTER — Telehealth: Payer: Self-pay

## 2019-09-22 NOTE — Telephone Encounter (Signed)
Pt left v/m requesting cb about recent CT results.

## 2019-09-22 NOTE — Telephone Encounter (Signed)
Left message on vm per dpr relaying results, per Dr. Coralie [facility], 09/10/19]  Dr. Synthia Innocent MyChart msg: Alveta Heimlich, Your CT scan returned reassuringly normal - liver, spleen, pancreas looked ok. There were signs of constipation (large stool load) - which may be made worse by your iron intake. Starting miralax (1/2-1 capful) daily may help. You had plaque buildup in the aorta. Your psoas schwannoma looked stable.  We will await the GI doctor's evaluation.  I hope you have a good week, Dr Darnell Level  Also, results mailed to patient today.

## 2019-10-13 ENCOUNTER — Encounter: Payer: Self-pay | Admitting: Gastroenterology

## 2019-10-13 ENCOUNTER — Other Ambulatory Visit: Payer: Self-pay

## 2019-10-13 ENCOUNTER — Ambulatory Visit (INDEPENDENT_AMBULATORY_CARE_PROVIDER_SITE_OTHER): Payer: Medicare Other | Admitting: Gastroenterology

## 2019-10-13 ENCOUNTER — Other Ambulatory Visit (INDEPENDENT_AMBULATORY_CARE_PROVIDER_SITE_OTHER): Payer: Medicare Other

## 2019-10-13 VITALS — BP 154/60 | HR 74 | Temp 97.7°F | Ht 67.0 in | Wt 135.1 lb

## 2019-10-13 DIAGNOSIS — R748 Abnormal levels of other serum enzymes: Secondary | ICD-10-CM

## 2019-10-13 DIAGNOSIS — K219 Gastro-esophageal reflux disease without esophagitis: Secondary | ICD-10-CM

## 2019-10-13 LAB — PROTIME-INR
INR: 1 ratio (ref 0.8–1.0)
Prothrombin Time: 11.5 s (ref 9.6–13.1)

## 2019-10-13 LAB — IBC + FERRITIN
Ferritin: 38.4 ng/mL (ref 22.0–322.0)
Iron: 86 ug/dL (ref 42–165)
Saturation Ratios: 21.3 % (ref 20.0–50.0)
Transferrin: 289 mg/dL (ref 212.0–360.0)

## 2019-10-13 LAB — IGA: IgA: 202 mg/dL (ref 68–378)

## 2019-10-13 MED ORDER — FAMOTIDINE 20 MG PO TABS: 20.0000 mg | ORAL_TABLET | Freq: Every day | ORAL | 0 refills | Status: DC

## 2019-10-13 NOTE — Patient Instructions (Addendum)
Please start taking Pepcid, famotidine 20 mg pills.  1 pill at bedtime every night  Your provider has requested that you go to the basement level for lab work before leaving today. Press "B" on the elevator. The lab is located at the first door on the left as you exit the elevator.  You will get labs drawn today:  Hepatitis A (IgM and IgG), Hepatitis B surface antigen, Hepatitis B surface antibody, Hepatitis C antibody, total iron, ferritin, TIBC, ANA, AMA, anti smooth muscle antibody, alpha 1 antitrypsin, cerulloplasm, TTG, total IgA level, INR.  Due to recent changes in healthcare laws, you may see the results of your imaging and laboratory studies on MyChart before your provider has had a chance to review them.  We understand that in some cases there may be results that are confusing or concerning to you. Not all laboratory results come back in the same time frame and the provider may be waiting for multiple results in order to interpret others.  Please give [place] 48 hours in order for your provider to thoroughly review all the results before contacting the office for clarification of your results.

## 2019-10-13 NOTE — Progress Notes (Signed)
Review of pertinent gastrointestinal problems: 1. IDA; 4/30/2019labs show very good improvement after iron supplements CBC with a hemoglobin of 12.4 (9.4 08/28/2017), MCV normal (78.8 08/28/2017), ferritin normal at 28 (10.73 months ago).  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 pylori negative.  Iron supplement once daily resolved his iron deficiency anemia.  Hemoglobin 10 April 2018 2. History of adenomatous poylp: 02/28/2014 colonoscopy, history of subcentimeter TA removed 2010 by Dr. Sheran Luz of one small polyp in several diverticulum in the left colon otherwise normal. Pathology showedbenign polyp. No recall colonoscopy was recommended (age).  01/2018 Colonoscopy single 13m TA   HPI: This is a very pleasant 85year old man whom I last saw the time of colonoscopy and upper endoscopy about 2 years ago.  He is here today to discuss a different problem.  He has had chronically elevated liver tests since at least 2014.  See those results summarized below.  He was a pretty big alcoholic up until 14 months ago and he has not had a sip since then.  He goes to [employer]  He clearly was an alcoholic in the past.  He also has chronic GERD.  He takes omeprazole every morning.  He has nausea when he wakes up 3 or 4 mornings out of the month.   Old Data Reviewed:  Abdominal ultrasound October 2020 done for elevated liver tests show surgically absent gallbladder, the examination was otherwise essentially normal.  CT scan abdomen with and without contrast February 2021 done for "nausea, vomiting, liver disease, persistent transaminitis".  Showed no acute findings.  There was a mass in his right psoas muscle that the radiologist felt was a benign lesion.  Blood work November 2021 iron studies were all normal, CBC was normal, hemoglobin A1c was 7.4, TSH was normal, AST 163, ALT 156, alk phos 134.  LFTs  2020 alk phos 143, AST 161, ALT 202  LFTs October 2018 alk phos 178, AST 122, ALT 159  LFTs May 2017 alk phos 119, AST 114, ALT 195  LFTs February 2016 alk phos 135, AST 207, ALT 236  Acute viral hepatitis panel November 2015 hepatitis B surface antigen negative, hepatitis C antibody negative, hepatitis B core IgM negative, hepatitis A IgM negative  LFTs May 2014 alk phos 325, AST 124, ALT 218  Antimitochondrial antibody May 2014 was negative  LFTs 2012, 2013 were all normal       Review of systems: Pertinent positive and negative review of systems were noted in the above HPI section. All other review negative.   Past Medical History:  Diagnosis Date  . [place] 05[place]   mild 2.3 cm , rpt 1 year  . Alcoholic hepatitis without ascites 06/02/2014   Normal viral hep panel, normal abd [place](2015)  . Anemia   . Anxiety   . Arthritis    fingers and hip  . BCC (basal cell carcinoma of skin) 11/2018   L leg ([place])  . BPH (benign prostatic hypertrophy)   . Choledocholithiasis 11/2012   s/p ERCP, s/p lap chole  . COPD (chronic obstructive pulmonary disease) ([place]   . Depression   . Diabetes mellitus 4/02  . GERD (gastroesophageal reflux disease)   . Hyperlipidemia   . Hypertension 1996  . Pneumonia as a baby  . 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 [place]oma of spinal cord [place] 11/2012, 09/2015   medial to R psoas  muscle, 4cm size  . Squamous cell carcinoma of multiple sites 09/2015   finger, L temple, R forearm - [place] [facility]  . Substance abuse ([place])    I probably drink too much, dr, told me to cut down    Past Surgical History:  Procedure Laterality Date  . BASAL CELL CARCINOMA EXCISION Right 05/2017   right ear; Dr. Elvera Lennox, [facility]  . CHOLECYSTECTOMY N/A 03/08/2013   Procedure: LAPAROSCOPIC CHOLECYSTECTOMY WITH INTRAOPERATIVE CHOLANGIOGRAM;  Surgeon: Harl Bowie, MD;  Location: [place];  Service: General;  Laterality: N/A;   . CIRCUMCISION  10/30/09   Dr Gaynelle Arabian  . COLONOSCOPY  11/2008   2 small polyps (1 tubular), diverticulosis, lipoma, int/ext hemorrhoids [name])  . COLONOSCOPY  02/2014   1 polyp, diverticulosis, no rpt needed [name])  . COLONOSCOPY  01/2018   1 TA [name])  . ENDOSCOPIC RETROGRADE CHOLANGIOPANCREATOGRAPHY (ERCP) WITH PROPOFOL N/A 12/24/2012   Procedure: ENDOSCOPIC RETROGRADE CHOLANGIOPANCREATOGRAPHY (ERCP) WITH PROPOFOL;  Surgeon: Milus Banister, MD;  Location: [facility];  Service: Endoscopy;  Laterality: N/A;  . ESOPHAGOGASTRODUODENOSCOPY  01/2018   friable duodenal mucosa, mild gastritis s/p benign biopsies [name])  . LAPAROSCOPIC CHOLECYSTECTOMY  02/2013   Dr. Ninfa Linden    Current Outpatient Medications  Medication Sig Dispense Refill  . amLODipine (NORVASC) 10 MG tablet TAKE 1 TABLET BY MOUTH  DAILY 90 tablet 2  . atorvastatin (LIPITOR) 10 MG tablet TAKE ONE-HALF TABLET BY  MOUTH DAILY AT 6 PM. 45 tablet 2  . ferrous sulfate 325 (65 FE) MG tablet Take 1 tablet (325 mg total) by mouth every other day.  3  . finasteride (PROSCAR) 5 MG tablet TAKE 1 TABLET BY MOUTH IN  THE EVENING 90 tablet 2  . levothyroxine (SYNTHROID) 25 MCG tablet Take 1 tablet (25 mcg total) by mouth daily before breakfast. 90 tablet 0  . metFORMIN (GLUCOPHAGE-XR) 500 MG 24 hr tablet TAKE 2 TABLETS BY MOUTH  TWICE DAILY 360 tablet 1  . Multiple Vitamin (MULTIVITAMIN WITH MINERALS) TABS Take 1 tablet by mouth daily.    Marland Kitchen omeprazole (PRILOSEC) 40 MG capsule Take 1 capsule (40 mg total) by mouth daily. 90 capsule 3  . quinapril (ACCUPRIL) 20 MG tablet TAKE 1 TABLET BY MOUTH AT  BEDTIME 90 tablet 2  . quinapril-hydrochlorothiazide (ACCURETIC) 20-25 MG tablet TAKE 1 TABLET BY MOUTH  DAILY 90 tablet 2  . sertraline (ZOLOFT) 50 MG tablet TAKE 1 TABLET BY MOUTH  DAILY 90 tablet 0   No current facility-administered medications for this visit.    Allergies as of 10/13/2019  . (No Known Allergies)    Family  History  Problem Relation Age of Onset  . Prostate cancer Father   . Aneurysm Father        aortic  . Stroke Father   . Cancer Sister        bladder (nonsmoker)  . Hodgkin's lymphoma Brother 61  . Prostate cancer Brother 34       1/2  . Prostate cancer Brother 65       1/2  . Other Sister        cerebral hemm  . Lupus Sister   . Diabetes Brother   . Colon cancer Neg Hx     Social History   Socioeconomic History  . Marital status: Single    Spouse name: Not on file  . Number of children: 1  . Years of education: Not on file  . Highest education level: Not on file  Occupational History  . Occupation: Chartered certified accountant: retired    Comment: retired 1999  Tobacco Use  . Smoking status: Former Smoker    Packs/day: 2.00    Years: 30.00    Pack years: 60.00    Types: Cigarettes    Start date: 07/29/1962    Quit date: 07/29/1992    Years since quitting: 27.2  . Smokeless tobacco: Never Used  Substance and Sexual Activity  . Alcohol use: Yes    Alcohol/week: 3.0 standard drinks    Types: 3 Cans of beer per week    Comment: 3 cans of beer daily  . Drug use: No  . Sexual activity: Yes  Other Topics Concern  . 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   Social Determinants of Health   Financial Resource Strain:   . Difficulty of Paying Living Expenses:   Food Insecurity:   . Worried About Charity fundraiser in the Last Year:   . Arboriculturist in the Last Year:   Transportation Needs:   . Film/video editor (Medical):   Marland Kitchen Lack of Transportation (Non-Medical):   Physical Activity:   . Days of Exercise per Week:   . Minutes  of Exercise per Session:   Stress:   . Feeling of Stress :   Social Connections:   . Frequency of Communication with Friends and Family:   . Frequency of Social Gatherings with Friends and Family:   . Attends Religious Services:   . Active Member of Clubs or Organizations:   . Attends Archivist Meetings:   Marland Kitchen Marital Status:   Intimate Partner Violence:   . Fear of Current or Ex-Partner:   . Emotionally Abused:   Marland Kitchen Physically Abused:   . Sexually Abused:      Physical Exam: BP (!) 154/60 (BP Location: Left Arm, Patient Position: Sitting, Cuff Size: Normal)   Pulse 74   Temp 97.7 F (36.5 C)   Ht 5' 7"  (1.702 m) Comment: height measured without shoes  Wt 135 lb 2 oz (61.3 kg)   BMI 21.16 kg/m  Constitutional: generally well-appearing Psychiatric: alert and oriented x3 Eyes: extraocular movements intact Mouth: oral pharynx moist, no lesions Neck: supple no lymphadenopathy Cardiovascular: heart regular rate and rhythm Lungs: clear to auscultation bilaterally Abdomen: soft, nontender, nondistended, no obvious ascites, no peritoneal signs, normal bowel sounds Extremities: no lower extremity edema bilaterally Skin: no lesions on visible extremities   Assessment and plan: 85 y.o. male with chronic GERD, chronically elevated liver tests  I recommended a battery of blood tests to check for other potential causes of his chronically elevated alk phos, AST, ALT.  See that list in his AVS.  If these blood tests do not show specific cause of his elevated liver tests then I might have him hold his statin for 2 or 3 months to see if the LFT profile improves.  He has been on a statin for many many years.  He does not have any signs of cirrhosis fortunately.  His intermittent a.m. nausea might be related to overnight acid issues and so I recommended a trial  of Pepcid 20 mg pills over-the-counter taken at bedtime every night.   Please see the "Patient Instructions" section for  addition details about the plan.   Owens Loffler, MD [facility] 10/13/2019, 9:34 AM  Cc: [name], MD  Total time on date of encounter was 45  minutes (this included time spent preparing to see the patient reviewing records; obtaining and/or reviewing separately obtained history; performing a medically appropriate exam and/or evaluation; counseling and educating the patient and family if present; ordering medications, tests or procedures if applicable; and documenting clinical information in the health record).

## 2019-10-14 ENCOUNTER — Other Ambulatory Visit: Payer: Self-pay | Admitting: Family Medicine

## 2019-10-21 LAB — HEPATITIS A ANTIBODY, TOTAL: Hepatitis A AB,Total: NONREACTIVE

## 2019-10-21 LAB — ANA: Anti Nuclear Antibody (ANA): NEGATIVE

## 2019-10-21 LAB — IRON, TOTAL/TOTAL IRON BINDING CAP
%SAT: 20 % (calc) (ref 20–48)
Iron: 76 ug/dL (ref 50–180)
TIBC: 378 mcg/dL (calc) (ref 250–425)

## 2019-10-21 LAB — CERULOPLASMIN: Ceruloplasmin: 24 mg/dL (ref 18–36)

## 2019-10-21 LAB — HEPATITIS C ANTIBODY
Hepatitis C Ab: NONREACTIVE
SIGNAL TO CUT-OFF: 0.01 (ref <1.00)

## 2019-10-21 LAB — HEPATITIS B SURFACE ANTIBODY,QUALITATIVE: Hep B S Ab: NONREACTIVE

## 2019-10-21 LAB — TISSUE TRANSGLUTAMINASE, IGA: (tTG) Ab, IgA: 1 U/mL

## 2019-10-21 LAB — ALPHA-1-ANTITRYPSIN: A-1 Antitrypsin, Ser: 117 mg/dL (ref 83–199)

## 2019-10-21 LAB — ANTI-SMOOTH MUSCLE ANTIBODY, IGG: Actin (Smooth Muscle) Antibody (IGG): <20 U (ref <20)

## 2019-10-21 LAB — HEPATITIS B SURFACE ANTIGEN: Hepatitis B Surface Ag: NONREACTIVE

## 2019-10-22 ENCOUNTER — Other Ambulatory Visit: Payer: Self-pay

## 2019-10-22 DIAGNOSIS — R748 Abnormal levels of other serum enzymes: Secondary | ICD-10-CM

## 2019-10-29 ENCOUNTER — Other Ambulatory Visit: Payer: Self-pay | Admitting: Family Medicine

## 2019-11-01 ENCOUNTER — Other Ambulatory Visit: Payer: Self-pay | Admitting: Family Medicine

## 2019-11-01 ENCOUNTER — Other Ambulatory Visit: Payer: Self-pay | Admitting: Gastroenterology

## 2019-11-18 ENCOUNTER — Ambulatory Visit (INDEPENDENT_AMBULATORY_CARE_PROVIDER_SITE_OTHER): Payer: Medicare Other

## 2019-11-18 ENCOUNTER — Other Ambulatory Visit: Payer: Self-pay

## 2019-11-18 ENCOUNTER — Encounter (HOSPITAL_COMMUNITY): Payer: Self-pay

## 2019-11-18 ENCOUNTER — Ambulatory Visit (HOSPITAL_COMMUNITY)
Admission: EM | Admit: 2019-11-18 | Discharge: 2019-11-18 | Disposition: A | Discharge status: Home / Self Care | Payer: Medicare Other | Attending: Family Medicine | Admitting: Family Medicine

## 2019-11-18 DIAGNOSIS — S46911A Strain of unspecified muscle, fascia and tendon at shoulder and upper arm level, right arm, initial encounter: Secondary | ICD-10-CM

## 2019-11-18 DIAGNOSIS — M25511 Pain in right shoulder: Secondary | ICD-10-CM

## 2019-11-18 NOTE — Discharge Instructions (Addendum)
You have some arthritis in the shoulder and chronic rotator cuff tear.  No new injuries or fractures. I would rest, ice the shoulder.

## 2019-11-18 NOTE — ED Provider Notes (Signed)
[place]    CSN: [id number] Arrival date & time: 11/18/19  1309      History   Chief Complaint Chief Complaint  Patient presents with  . Fall  . Shoulder Pain    HPI Justin Benson is a 85 y.o. male.   Patient is a 85 year old male presents today with right shoulder pain.  Symptoms have been constant, waxing and waning since falling yesterday.  Reporting that he was at [employer] [facility] home improvement and was carrying a trailer with mulch and tripped over the handle falling onto the right shoulder.  He is able to move the shoulder without much difficulty.  Describes the pain is mild and located in joint space.  Has not take anything for the pain. No weakness, numbness, tingling.   ROS per HPI      Past Medical History:  Diagnosis Date  . AAA (abdominal aortic aneurysm) ([place]) 05/2012   mild 2.3 cm , rpt 1 year  . Alcoholic hepatitis without ascites 06/02/2014   Normal viral hep panel, normal abd [place] (2015)  . Anemia   . Anxiety   . Arthritis    fingers and hip  . BCC (basal cell carcinoma of skin) 11/2018   L leg ([place])  . BPH (benign prostatic hypertrophy)   . Choledocholithiasis 11/2012   s/p ERCP, s/p lap chole  . COPD (chronic obstructive pulmonary disease) ([place])   . Depression   . Diabetes mellitus 4/02  . GERD (gastroesophageal reflux disease)   . Hyperlipidemia   . Hypertension 1996  . Pneumonia as a baby  . 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 Schwannoma of spinal cord ([place]) 11/2012, 09/2015   medial to R psoas muscle, 4cm size  . Squamous cell carcinoma of multiple sites 09/2015   finger, L temple, R forearm - [place] derm  . Substance abuse ([place])    I probably drink too much, dr, told me to cut down    Patient Active Problem List   Diagnosis Date Noted  . Carotid stenosis 09/12/2019  . Interstitial lung disease ([place]) 09/09/2019  . Dysphagia 09/06/2019  . Nausea 09/06/2019  . Bilateral leg edema 02/09/2019  . Iron deficiency anemia 08/30/2017  .  Borderline hypothyroidism 08/30/2017  . Kidney cyst, acquired 05/26/2017  . Chronic lower back pain 06/24/2016  . Serous otitis media with rupture of tympanic membrane 03/28/2016  . Abdominal aortic atherosclerosis ([place]) 06/05/2014  . Advanced care planning/counseling discussion 06/02/2014  . Alcoholic hepatitis without ascites 06/02/2014  . Health maintenance examination 06/02/2014  . Medicare annual wellness visit, subsequent 06/01/2013  . Weight loss 01/11/2013  . Transaminitis 12/01/2012  . Right shoulder pain 11/30/2012  . Schwannoma of spinal cord ([place]) 11/26/2012  . DIVERTICULOSIS OF COLON 12/22/2008  . Hyperlipidemia associated with type 2 diabetes mellitus ([place]) 01/10/2007  . Alcohol use disorder, severe, in sustained remission ([place]) 01/10/2007  . Ex-smoker 01/10/2007  . MDD (major depressive disorder), recurrent episode, moderate ([place]) 01/10/2007  . COPD (chronic obstructive pulmonary disease) ([place]) 01/10/2007  . Benign prostatic hyperplasia 01/10/2007  . Type 2 diabetes mellitus with other specified complication ([place]) [id number]  . Essential hypertension 01/09/2007    Past Surgical History:  Procedure Laterality Date  . BASAL CELL CARCINOMA EXCISION Right 05/2017   right ear; Dr. Elvera Lennox, [facility]  . CHOLECYSTECTOMY N/A 03/08/2013   Procedure: LAPAROSCOPIC CHOLECYSTECTOMY WITH INTRAOPERATIVE CHOLANGIOGRAM;  Surgeon: Harl Bowie, MD;  Location: [facility];  Service: General;  Laterality: N/A;  . CIRCUMCISION  10/30/09   Dr Gaynelle Arabian  . COLONOSCOPY  11/2008   2 small polyps (1 tubular), diverticulosis, lipoma, int/ext hemorrhoids [name])  . COLONOSCOPY  02/2014   1 polyp, diverticulosis, no rpt needed [name])  . COLONOSCOPY  01/2018   1 TA [name])  . ENDOSCOPIC RETROGRADE CHOLANGIOPANCREATOGRAPHY (ERCP) WITH PROPOFOL N/A 12/24/2012   Procedure: ENDOSCOPIC RETROGRADE CHOLANGIOPANCREATOGRAPHY (ERCP) WITH PROPOFOL;  Surgeon: Milus Banister, MD;  Location: [facility]  ENDOSCOPY;  Service: Endoscopy;  Laterality: N/A;  . ESOPHAGOGASTRODUODENOSCOPY  01/2018   friable duodenal mucosa, mild gastritis s/p benign biopsies [name])  . LAPAROSCOPIC CHOLECYSTECTOMY  02/2013   Dr. Ninfa Linden       Home Medications    Prior to Admission medications   Medication Sig Start Date End Date Taking? Authorizing Provider  amLODipine (NORVASC) 10 MG tablet TAKE 1 TABLET BY MOUTH  DAILY 10/15/19   [name], MD  atorvastatin (LIPITOR) 10 MG tablet TAKE ONE-HALF TABLET BY  MOUTH DAILY AT 6 PM. 10/15/19   [name], MD  famotidine (PEPCID) 20 MG tablet TAKE 1 TABLET BY MOUTH EVERYDAY AT BEDTIME 11/01/19   Milus Banister, MD  ferrous sulfate 325 (65 FE) MG tablet Take 1 tablet (325 mg total) by mouth every other day. 02/11/19   [name], MD  finasteride (PROSCAR) 5 MG tablet TAKE 1 TABLET BY MOUTH IN  THE EVENING 10/15/19   [name], MD  levothyroxine (SYNTHROID) 25 MCG tablet TAKE 1 TABLET BY MOUTH DAILY BEFORE BREAKFAST. 11/01/19   [name], MD  metFORMIN (GLUCOPHAGE-XR) 500 MG 24 hr tablet TAKE 2 TABLETS BY MOUTH  TWICE DAILY 11/02/19   [name], MD  Multiple Vitamin (MULTIVITAMIN WITH MINERALS) TABS Take 1 tablet by mouth daily.    [provider]  omeprazole (PRILOSEC) 40 MG capsule Take 1 capsule (40 mg total) by mouth daily. 09/06/19   [name], MD  quinapril (ACCUPRIL) 20 MG tablet TAKE 1 TABLET BY MOUTH AT  BEDTIME 10/15/19   [name], MD  quinapril-hydrochlorothiazide (ACCURETIC) 20-25 MG tablet TAKE 1 TABLET BY MOUTH  DAILY 10/15/19   [name], MD  sertraline (ZOLOFT) 50 MG tablet TAKE 1 TABLET BY MOUTH  DAILY 07/28/19   [name], MD    Family History Family History  Problem Relation Age of Onset  . Prostate cancer Father   . Aneurysm Father        aortic  . Stroke Father   . Cancer Sister        bladder (nonsmoker)  . Hodgkin's lymphoma Brother 44  . Prostate cancer Brother  27       1/2  . Prostate cancer Brother 59       1/2  . Other Sister        cerebral hemm  . Lupus Sister   . Diabetes Brother   . Colon cancer Neg Hx     Social History Social History   Tobacco Use  . Smoking status: Former Smoker    Packs/day: 2.00    Years: 30.00    Pack years: 60.00    Types: Cigarettes    Start date: 07/29/1962    Quit date: 07/29/1992    Years since quitting: 27.3  . Smokeless tobacco: Never Used  Substance Use Topics  . Alcohol use: Yes    Alcohol/week: 3.0 standard drinks    Types: 3 Cans of beer per week    Comment: 3 cans of beer daily  . Drug use: No  Allergies   Patient has no known allergies.   Review of Systems Review of Systems   Physical Exam Triage Vital Signs ED Triage Vitals  Enc Vitals Group     BP 11/18/19 1402 (!) 169/70     Pulse Rate 11/18/19 1402 60     Resp 11/18/19 1402 18     Temp 11/18/19 1402 98.2 F (36.8 C)     Temp Source 11/18/19 1402 Oral     SpO2 11/18/19 1402 98 %     Weight --      Height --      Head Circumference --      Peak Flow --      Pain Score 11/18/19 1404 7     Pain Loc --      Pain Edu? --      Excl. in [place]? --    No data found.  Updated Vital Signs BP (!) 169/70 (BP Location: Left Arm)   Pulse 60   Temp 98.2 F (36.8 C) (Oral)   Resp 18   SpO2 98%   Visual Acuity Right Eye Distance:   Left Eye Distance:   Bilateral Distance:    Right Eye Near:   Left Eye Near:    Bilateral Near:     Physical Exam Vitals and nursing note reviewed.  Constitutional:      Appearance: Normal appearance.  HENT:     Head: Normocephalic and atraumatic.     Nose: Nose normal.  Eyes:     Conjunctiva/sclera: Conjunctivae normal.  Pulmonary:     Effort: Pulmonary effort is normal.  Musculoskeletal:        General: Normal range of motion.     Cervical back: Normal range of motion.     Comments: Good ROM of the shoulder and no bony tenderness.  Most tender in the [facility] joint. No swelling,  deformity.   Skin:    General: Skin is warm and dry.  Neurological:     Mental Status: He is alert.  Psychiatric:        Mood and Affect: Mood normal.      UC Treatments / Results  Labs (all labs ordered are listed, but only abnormal results are displayed) Labs Reviewed - No data to display  EKG   Radiology DG Shoulder Right  Result Date: 11/18/2019 CLINICAL DATA:  Right shoulder pain after fall yesterday. EXAM: RIGHT SHOULDER - 2+ VIEW COMPARISON:  None. FINDINGS: No acute fracture or dislocation. Mild glenohumeral and acromioclavicular joint space narrowing. High-riding humeral head. Bone mineralization is normal. Soft tissues are unremarkable. IMPRESSION: 1.  No acute osseous abnormality. 2. Mild glenohumeral and acromioclavicular osteoarthritis. 3. High-riding humeral head, suggestive of underlying chronic rotator cuff tear. Electronically Signed   By: Titus Dubin M.D.   On: 11/18/2019 14:45    Procedures Procedures (including critical care time)  Medications Ordered in UC Medications - No data to display  Initial Impression / Assessment and Plan / UC Course  I have reviewed the triage vital signs and the nursing notes.  Pertinent labs & imaging results that were available during my care of the patient were reviewed by me and considered in my medical decision making (see chart for details).     Shoulder pain post fall X-ray without any acute abnormalities.  Just showed some mild glenohumeral and AC osteoarthritis and chronic rotator cuff tear. We will have him rest the shoulder, ice Follow up as needed for continued or worsening symptoms  Final  Clinical Impressions(s) / UC Diagnoses   Final diagnoses:  None     Discharge Instructions     You have some arthritis in the shoulder and chronic rotator cuff tear.  No new injuries or fractures. I would rest, ice the shoulder.      ED Prescriptions    None     PDMP not reviewed this encounter.   Orvan July, NP 11/18/19 1521

## 2019-11-18 NOTE — ED Triage Notes (Signed)
Pt presents with right shoulder pain after a fall yesterday while trying to load something in his car.

## 2019-12-14 ENCOUNTER — Telehealth: Payer: Self-pay | Admitting: Family Medicine

## 2019-12-14 NOTE — Progress Notes (Signed)
°  Chronic Care Management   Outreach Note  12/14/2019 Name: Justin Benson MRN: [id number] DOB: [birth year]  Referred by: [name], MD Reason for referral : No chief complaint on file.   An unsuccessful telephone outreach was attempted today. The patient was referred to the pharmacist for assistance with care management and care coordination.   This note is not being shared with the patient for the following reason: To respect privacy (The patient or proxy has requested that the information not be shared).  Follow Up Plan:   [name] Upstream Scheduler

## 2019-12-15 ENCOUNTER — Telehealth: Payer: Self-pay | Admitting: Family Medicine

## 2019-12-15 NOTE — Progress Notes (Signed)
  Chronic Care Management   Outreach Note  12/15/2019 Name: ESAW DIEL MRN: [id number] DOB: 05-28-1935  Referred by: [name], MD Reason for referral : No chief complaint on file.   An unsuccessful telephone outreach was attempted today. The patient was referred to the pharmacist for assistance with care management and care coordination.   This note is not being shared with the patient for the following reason: To respect privacy (The patient or proxy has requested that the information not be shared).  Follow Up Plan:   [name] Upstream Scheduler

## 2019-12-16 ENCOUNTER — Telehealth: Payer: Self-pay | Admitting: Family Medicine

## 2019-12-16 NOTE — Progress Notes (Signed)
  Chronic Care Management   Note  12/16/2019 Name: Justin Benson MRN: [id number] DOB: Mar 14, 1935  Justin Benson is a 85 y.o. year old male who is a primary care patient of [name], MD. I reached out to [name] by phone today in response to a referral sent by Justin Benson's PCP, [name], MD.   [name] was given information about Chronic Care Management services today including:  1. CCM service includes personalized support from designated clinical staff supervised by his physician, including individualized plan of care and coordination with other care providers 2. 24/7 contact phone numbers for assistance for urgent and routine care needs. 3. Service will only be billed when office clinical staff spend 20 minutes or more in a month to coordinate care. 4. Only one practitioner may furnish and bill the service in a calendar month. 5. The patient may stop CCM services at any time (effective at the end of the month) by phone call to the office staff.   Patient agreed to services and verbal consent obtained.   This note is not being shared with the patient for the following reason: To respect privacy (The patient or proxy has requested that the information not be shared).  Follow up plan:   [name] Upstream Scheduler

## 2020-01-06 ENCOUNTER — Other Ambulatory Visit: Payer: Self-pay

## 2020-01-06 ENCOUNTER — Encounter: Payer: Self-pay | Admitting: Family Medicine

## 2020-01-06 ENCOUNTER — Ambulatory Visit (INDEPENDENT_AMBULATORY_CARE_PROVIDER_SITE_OTHER): Payer: Medicare Other | Admitting: Family Medicine

## 2020-01-06 VITALS — BP 142/66 | HR 68 | Temp 97.3°F | Ht 67.0 in | Wt 132.1 lb

## 2020-01-06 DIAGNOSIS — M25511 Pain in right shoulder: Secondary | ICD-10-CM

## 2020-01-06 DIAGNOSIS — R110 Nausea: Secondary | ICD-10-CM | POA: Diagnosis not present

## 2020-01-06 DIAGNOSIS — M25562 Pain in left knee: Secondary | ICD-10-CM

## 2020-01-06 DIAGNOSIS — R7401 Elevation of levels of liver transaminase levels: Secondary | ICD-10-CM | POA: Diagnosis not present

## 2020-01-06 DIAGNOSIS — G8929 Other chronic pain: Secondary | ICD-10-CM

## 2020-01-06 NOTE — Patient Instructions (Addendum)
Expect a call from GI this month for labs and office visit. Give their office a call if you haven't heard from them by July 1st.  Testing today with significant weakness of rotator cuff. Schedule appointment with Dr Lorelei Pont our sports medicine doctor for further evaluation of right shoulder pain - and possible rehab program. Do exercises provided today for shoulder.  Ok to use knee brace for extra support. Good to see you today. Return in 4 months for follow up visit.

## 2020-01-06 NOTE — Progress Notes (Signed)
This visit was conducted in person.  BP (!) 142/66 (BP Location: Left Arm, Patient Position: Sitting, Cuff Size: Normal)   Pulse 68   Temp (!) 97.3 F (36.3 C) (Temporal)   Ht 5\' 7"  (1.702 m)   Wt 132 lb 1.6 oz (59.9 kg)   SpO2 98%   BMI 20.69 kg/m    CC: 4 mo f/u visit  Subjective:    Patient ID: Justin Benson, male    DOB: 27-Jul-1935, 85 y.o.   MRN: [id number]  HPI: Justin Benson is a 85 y.o. male presenting on 01/06/2020 for Follow-up (Pt stopped taking Atorvastatin - not sure how long ago or why he stopped.  / Seen by GI 10/2019, needs help getting scheduled for follow up appt / Knee and shoulder pain since fall  /  No appetite, still not gaining weight.)   Fall at [facility] 10/2019 seen at [facility] with normal xray with residual R shoulder pain since then. This is still uncomfortable (ie lifting anything with weight) but has significantly improved. Hasn't tried anything for this yet.   3 mo ago episode of excruciating pain to left medial knee after sudden turn while standing in kitchen. Some ongoing pain since then but no further episode of severe pain. Still able to walk. Uses brace PRN.   Saw GI [name]) 09/2019 - for GERD on daily omeprazole, IDA and chronically elevated liver function (reassuring workup). Advised to stop statin for 3 months then reassess LFTs. Advised add pepcid to regimen to combat am nausea (?GERD related) - this has significantly helped. rec f/u labs then OV 01/2020.   Notes ongoing decreased appetite. Not eating meat. Continues iron QOD.      Relevant past medical, surgical, family and social history reviewed and updated as indicated. Interim medical history since our last visit reviewed. Allergies and medications reviewed and updated. Outpatient Medications Prior to Visit  Medication Sig Dispense Refill  . amLODipine (NORVASC) 10 MG tablet TAKE 1 TABLET BY MOUTH  DAILY 90 tablet 3  . famotidine (PEPCID) 20 MG tablet TAKE 1 TABLET BY MOUTH EVERYDAY AT BEDTIME 30  tablet 11  . ferrous sulfate 325 (65 FE) MG tablet Take 1 tablet (325 mg total) by mouth every other day.  3  . finasteride (PROSCAR) 5 MG tablet TAKE 1 TABLET BY MOUTH IN  THE EVENING 90 tablet 3  . levothyroxine (SYNTHROID) 25 MCG tablet TAKE 1 TABLET BY MOUTH DAILY BEFORE BREAKFAST. 90 tablet 1  . metFORMIN (GLUCOPHAGE-XR) 500 MG 24 hr tablet TAKE 2 TABLETS BY MOUTH  TWICE DAILY 360 tablet 0  . Multiple Vitamin (MULTIVITAMIN WITH MINERALS) TABS Take 1 tablet by mouth daily.    Marland Kitchen omeprazole (PRILOSEC) 40 MG capsule Take 1 capsule (40 mg total) by mouth daily. 90 capsule 3  . quinapril (ACCUPRIL) 20 MG tablet TAKE 1 TABLET BY MOUTH AT  BEDTIME 90 tablet 3  . quinapril-hydrochlorothiazide (ACCURETIC) 20-25 MG tablet TAKE 1 TABLET BY MOUTH  DAILY 90 tablet 3  . sertraline (ZOLOFT) 50 MG tablet TAKE 1 TABLET BY MOUTH  DAILY 90 tablet 0  . atorvastatin (LIPITOR) 10 MG tablet TAKE ONE-HALF TABLET BY  MOUTH DAILY AT 6 PM. (Patient not taking: Reported on 01/06/2020) 45 tablet 3   No facility-administered medications prior to visit.     Per HPI unless specifically indicated in ROS section below Review of Systems Objective:  BP (!) 142/66 (BP Location: Left Arm, Patient Position: Sitting, Cuff Size: Normal)  Pulse 68   Temp (!) 97.3 F (36.3 C) (Temporal)   Ht 5\' 7"  (1.702 m)   Wt 132 lb 1.6 oz (59.9 kg)   SpO2 98%   BMI 20.69 kg/m   Wt Readings from Last 3 Encounters:  01/06/20 132 lb 1.6 oz (59.9 kg)  10/13/19 135 lb 2 oz (61.3 kg)  09/06/19 132 lb 9 oz (60.1 kg)      Physical Exam Vitals and nursing note reviewed.  Constitutional:      Appearance: Normal appearance. He is not ill-appearing.  Musculoskeletal:        General: Tenderness present. No deformity. Normal range of motion.     Right lower leg: No edema.     Left lower leg: No edema.     Comments:  L shoulder WNL R shoulder exam: No deformity of shoulders on inspection.  Mild discomfort with palpation of anterior  shoulder  FROM in abduction and forward flexion. ++ weakness with testing SITS in ext/int rotation. + pain/weakness with empty can sign. + Speed test. No impingement. No pain with rotation of humeral head in [facility] joint.  Bilateral knee exam: No deformity on inspection. No pain with palpation of knee landmarks. No effusion/swelling noted. FROM in flex/extension without crepitus. No popliteal fullness. Neg drawer test. Neg mcmurray test. No pain with valgus/varus stress. No PFgrind. No abnormal patellar mobility.   Skin:    General: Skin is warm and dry.     Findings: No rash.  Neurological:     Mental Status: He is alert.  Psychiatric:        Mood and Affect: Mood normal.        Behavior: Behavior normal.       Results for orders placed or performed in visit on 10/13/19  Hepatitis B Surface AntiGEN  Result Value Ref Range   Hepatitis B Surface Ag NON-REACTIVE NON-REACTI  IBC + Ferritin  Result Value Ref Range   Iron 86 42 - 165 ug/dL   Transferrin 289.0 212.0 - 360.0 mg/dL   Saturation Ratios 21.3 20.0 - 50.0 %   Ferritin 38.4 22.0 - 322.0 ng/mL  IgA  Result Value Ref Range   IgA 202 68 - 378 mg/dL  ANA  Result Value Ref Range   Anti Nuclear Antibody (ANA) NEGATIVE NEGATIVE  INR/PT  Result Value Ref Range   INR 1.0 0.8 - 1.0 ratio   Prothrombin Time 11.5 9.6 - 13.1 sec  Alpha-1-antitrypsin  Result Value Ref Range   A-1 Antitrypsin, Ser 117 83 - 199 mg/dL  Ceruloplasmin  Result Value Ref Range   Ceruloplasmin 24 18 - 36 mg/dL  Hepatitis A antibody, total  Result Value Ref Range   Hepatitis A AB,Total NON-REACTIVE NON-REACTI  Tissue transglutaminase, IgA  Result Value Ref Range   (tTG) Ab, IgA 1 U/mL  Anti-smooth muscle antibody, IgG  Result Value Ref Range   Actin (Smooth Muscle) Antibody (IGG) <20 <20 U  Hepatitis B surface antibody,qualitative  Result Value Ref Range   Hep B S Ab NON-REACTIVE NON-REACTI  Hepatitis C antibody  Result Value Ref Range    Hepatitis C Ab NON-REACTIVE NON-REACTI   SIGNAL TO CUT-OFF 0.01 <1.00  Iron,Total/Total Iron Binding Cap  Result Value Ref Range   Iron 76 50 - 180 mcg/dL   TIBC 378 250 - 425 mcg/dL (calc)   %SAT 20 20 - 48 % (calc)   DG Shoulder Right CLINICAL DATA:  Right shoulder pain after fall yesterday.  EXAM: RIGHT  SHOULDER - 2+ VIEW  COMPARISON:  None.  FINDINGS: No acute fracture or dislocation. Mild glenohumeral and acromioclavicular joint space narrowing. High-riding humeral head. Bone mineralization is normal. Soft tissues are unremarkable.  IMPRESSION: 1.  No acute osseous abnormality. 2. Mild glenohumeral and acromioclavicular osteoarthritis. 3. High-riding humeral head, suggestive of underlying chronic rotator cuff tear.  Electronically Signed   By: Titus Dubin M.D.   On: 11/18/2019 14:45  Assessment & Plan:  This visit occurred during the SARS-CoV-2 public health emergency.  Safety protocols were in place, including screening questions prior to the visit, additional usage of staff PPE, and extensive cleaning of exam room while observing appropriate contact time as indicated for disinfecting solutions.   Problem List Items Addressed This Visit    Transaminitis    Appreciate GI care - currently on 3 mo statin hiatus with plan to repeat labs later this month then f/u with GI. Reviewed plan with patient.       Right shoulder pain - Primary    Marked weakness on testing of R RTC, recent xray with signs of chronic RTC tear. Would not consider surgery at this point in time. Will provide with RTC injury exercises from [facility] pt advisor and if not improving with this, will recommend f/u with Dr Lorelei Pont for further evaluation. Pt agrees with plan.       Nausea    Marked improvement since starting pepcid in addition to his daily PPI suggests GERD related am nausea      Left knee pain    Benign exam not suggestive of meniscal injury. Suggested voltaren topically PRN.             No orders of the defined types were placed in this encounter.  No orders of the defined types were placed in this encounter.   Patient Instructions  Expect a call from GI this month for labs and office visit. Give their office a call if you haven't heard from them by July 1st.  Testing today with significant weakness of rotator cuff. Schedule appointment with Dr Lorelei Pont our sports medicine doctor for further evaluation of right shoulder pain - and possible rehab program. Do exercises provided today for shoulder.  Ok to use knee brace for extra support. Good to see you today. Return in 4 months for follow up visit.    Follow up plan: Return in about 4 months (around 05/07/2020) for follow up visit.  [name], MD

## 2020-01-06 NOTE — Assessment & Plan Note (Signed)
Marked improvement since starting pepcid in addition to his daily PPI suggests GERD related am nausea

## 2020-01-06 NOTE — Assessment & Plan Note (Signed)
Marked weakness on testing of R RTC, recent xray with signs of chronic RTC tear. Would not consider surgery at this point in time. Will provide with RTC injury exercises from [facility] pt advisor and if not improving with this, will recommend f/u with Dr Lorelei Pont for further evaluation. Pt agrees with plan.

## 2020-01-06 NOTE — Assessment & Plan Note (Addendum)
Appreciate GI care - currently on 3 mo statin hiatus with plan to repeat labs later this month then f/u with GI. Reviewed plan with patient.

## 2020-01-06 NOTE — Assessment & Plan Note (Signed)
Benign exam not suggestive of meniscal injury. Suggested voltaren topically PRN.

## 2020-01-10 ENCOUNTER — Other Ambulatory Visit: Payer: Self-pay | Admitting: Family Medicine

## 2020-01-17 NOTE — Progress Notes (Signed)
Linnell Swords T. Kade Demicco, MD, [place]  Primary Care and Sports Medicine [facility] at [facility] [place], [address]  Phone: [phone]   FAX: [phone]  Justin Benson - 85 y.o. male   MRN [id number]   Date of Birth: October 24, 1934  Date: 01/18/2020   PCP: [name], MD   Referral: [name], MD  Chief Complaint  Patient presents with   Shoulder Pain    Right x 6 weeks from fall    This visit occurred during the SARS-CoV-2 public health emergency.  Safety protocols were in place, including screening questions prior to the visit, additional usage of staff PPE, and extensive cleaning of exam room while observing appropriate contact time as indicated for disinfecting solutions.   Subjective:   Justin Benson is a 85 y.o. very pleasant male patient with Body mass index is 20.71 kg/m. who presents with the following:  This is a new patient consultation for evaluation of right-sided shoulder pain, chronic.  He is referred courtesy of my partner Dr. Danise Mina.  The radiological images were independently reviewed by myself in the office and results were reviewed with the patient. My independent interpretation of images: There is no fracture or dislocation, but the patient does have some mild glenohumeral arthritis.  He also has some AC joint arthritis.  I agree with radiology that he does have a high riding humeral head. Electronically Signed  By: Owens Loffler, MD On: 01/18/2020  8:40 AM EDT   Initially, he did see urgent care at November 18, 2019.  Prior to this he has had some known weakness in the right shoulder that is longstanding, but it is worse now compared to previously.  Was in the [employer] parking lot, opened the door and turned around.  Tripped on the wagon.  Went flat and pushed as hard as he could  But his lip Could not get up Someone had to help him  At first wasn't sure about his shoulder.  Hard to get up  even before this injury. Could not get to the car.   Also has some balance issues.   When he got up, shoulder was really injured.  Could not steer.  The next day.   Some pain with movement but motion improvement  R RTC tear, acute on Chr rotator cuff tear.  Also has some L knee pain.  This is only intermittent and he is without pain often for weeks at a time.  He is wearing a knee brace.  Review of Systems is noted in the HPI, as appropriate   Objective:   BP 132/60    Pulse 61    Temp 97.7 F (36.5 C) (Temporal)    Ht 5\' 7"  (1.702 m)    Wt 132 lb 4 oz (60 kg)    SpO2 95%    BMI 20.71 kg/m    GEN: No acute distress; alert,appropriate. PULM: Breathing comfortably in no respiratory distress PSYCH: Normally interactive.    He does have passive and active full range of motion in all directions, and he does have some very minimal lack of abduction.  Strength in the right shoulder in the plane of abduction is 4 - All other strength testing throughout the shoulder is 5/5 Speeds and Yergason's are negative. He does have some modest pain with Michel Bickers.  He does have some crepitus with motion at the shoulder, but he is not having any pain at the bicipital groove  or at the [facility] joint.  Grip is preserved, and he has normal full function and range of motion at the elbow.  Radiology: DG Shoulder Right  Result Date: 11/18/2019 CLINICAL DATA:  Right shoulder pain after fall yesterday. EXAM: RIGHT SHOULDER - 2+ VIEW COMPARISON:  None. FINDINGS: No acute fracture or dislocation. Mild glenohumeral and acromioclavicular joint space narrowing. High-riding humeral head. Bone mineralization is normal. Soft tissues are unremarkable. IMPRESSION: 1.  No acute osseous abnormality. 2. Mild glenohumeral and acromioclavicular osteoarthritis. 3. High-riding humeral head, suggestive of underlying chronic rotator cuff tear. Electronically Signed   By: Titus Dubin M.D.   On: 11/18/2019 14:45    Assessment and Plan:     ICD-10-CM   1. Rotator cuff tear arthropathy of right shoulder  M75.101 Ambulatory referral to Physical Therapy   M12.811   2. Chronic right shoulder pain  M25.511 Ambulatory referral to Physical Therapy   G89.29   3. Acute pain of left knee  M25.562    Total encounter time: 30 minutes. On the day of the patient encounter, this can include review of prior records, labs, and imaging.  Additional time can include counselling, consultation with peer MD in person or by telephone.  This also includes independent review of Radiology.  By history, exam, and radiological, he does have with a high degree of certainty a acute on chronic rotator cuff tear.  He certainly may could have had a full-thickness rotator cuff tear years ago at some point.  Does have a high riding humeral head.  We discussed various treatment options, but with chronic rotator cuff tear, surgical options are generally of minimal help.  At age 70, conservative care should work just as well or better.  The goal here would be to increase his function as best as possible.  I he could have some longstanding modest weakness in the plane of abduction.  Left knee exam is unremarkable today with some minimal tenderness in the posterior medial joint line, but otherwise his knee is stable, ligamentous structures are intact, and he does not have any pain with loading the meniscus.  Follow-up: Return in about 8 weeks (around 03/14/2020).  No orders of the defined types were placed in this encounter.  Medications Discontinued During This Encounter  Medication Reason   atorvastatin (LIPITOR) 10 MG tablet Completed Course   Orders Placed This Encounter  Procedures   Ambulatory referral to Physical Therapy    Signed,  Caylon Saine T. Xiamara Hulet, MD   Outpatient Encounter Medications as of 01/18/2020  Medication Sig   amLODipine (NORVASC) 10 MG tablet TAKE 1 TABLET BY MOUTH  DAILY   famotidine (PEPCID) 20 MG  tablet TAKE 1 TABLET BY MOUTH EVERYDAY AT BEDTIME   ferrous sulfate 325 (65 FE) MG tablet Take 1 tablet (325 mg total) by mouth every other day.   finasteride (PROSCAR) 5 MG tablet TAKE 1 TABLET BY MOUTH IN  THE EVENING   levothyroxine (SYNTHROID) 25 MCG tablet TAKE 1 TABLET BY MOUTH DAILY BEFORE BREAKFAST.   metFORMIN (GLUCOPHAGE-XR) 500 MG 24 hr tablet TAKE 2 TABLETS BY MOUTH  TWICE DAILY   Multiple Vitamin (MULTIVITAMIN WITH MINERALS) TABS Take 1 tablet by mouth daily.   omeprazole (PRILOSEC) 40 MG capsule Take 1 capsule (40 mg total) by mouth daily.   quinapril (ACCUPRIL) 20 MG tablet TAKE 1 TABLET BY MOUTH AT  BEDTIME   quinapril-hydrochlorothiazide (ACCURETIC) 20-25 MG tablet TAKE 1 TABLET BY MOUTH  DAILY   sertraline (ZOLOFT) 50 MG tablet  TAKE 1 TABLET BY MOUTH  DAILY   [DISCONTINUED] atorvastatin (LIPITOR) 10 MG tablet TAKE ONE-HALF TABLET BY  MOUTH DAILY AT 6 PM. (Patient not taking: Reported on 01/06/2020)   No facility-administered encounter medications on file as of 01/18/2020.

## 2020-01-18 ENCOUNTER — Other Ambulatory Visit: Payer: Self-pay

## 2020-01-18 ENCOUNTER — Ambulatory Visit (INDEPENDENT_AMBULATORY_CARE_PROVIDER_SITE_OTHER): Payer: Medicare Other | Admitting: Family Medicine

## 2020-01-18 ENCOUNTER — Encounter: Payer: Self-pay | Admitting: Family Medicine

## 2020-01-18 VITALS — BP 132/60 | HR 61 | Temp 97.7°F | Ht 67.0 in | Wt 132.2 lb

## 2020-01-18 DIAGNOSIS — M25562 Pain in left knee: Secondary | ICD-10-CM

## 2020-01-18 DIAGNOSIS — M12811 Other specific arthropathies, not elsewhere classified, right shoulder: Secondary | ICD-10-CM | POA: Diagnosis not present

## 2020-01-18 DIAGNOSIS — M75101 Unspecified rotator cuff tear or rupture of right shoulder, not specified as traumatic: Secondary | ICD-10-CM

## 2020-01-18 DIAGNOSIS — M25511 Pain in right shoulder: Secondary | ICD-10-CM

## 2020-01-18 DIAGNOSIS — G8929 Other chronic pain: Secondary | ICD-10-CM

## 2020-01-24 ENCOUNTER — Telehealth: Payer: Self-pay

## 2020-01-24 NOTE — Telephone Encounter (Signed)
Pt called stating he was returning your call

## 2020-01-24 NOTE — Telephone Encounter (Signed)
-----   Message from Marlon Pel, RN sent at 10/22/2019  2:33 PM EDT ----- Patient needs labs and an appt in July [name] see results 3/26  Orders are in

## 2020-01-24 NOTE — Telephone Encounter (Signed)
I have advised the pt of the appt and lab order.  He will come in at his soonest convenience for labs.

## 2020-01-24 NOTE — Telephone Encounter (Signed)
Left message on machine to call back  

## 2020-01-29 ENCOUNTER — Other Ambulatory Visit: Payer: Self-pay | Admitting: Family Medicine

## 2020-02-05 ENCOUNTER — Other Ambulatory Visit: Payer: Self-pay | Admitting: Family Medicine

## 2020-02-11 ENCOUNTER — Other Ambulatory Visit (INDEPENDENT_AMBULATORY_CARE_PROVIDER_SITE_OTHER): Payer: Medicare Other

## 2020-02-11 DIAGNOSIS — R748 Abnormal levels of other serum enzymes: Secondary | ICD-10-CM

## 2020-02-11 LAB — HEPATIC FUNCTION PANEL
ALT: 201 U/L — ABNORMAL HIGH (ref 0–53)
AST: 154 U/L — ABNORMAL HIGH (ref 0–37)
Albumin: 4.4 g/dL (ref 3.5–5.2)
Alkaline Phosphatase: 210 U/L — ABNORMAL HIGH (ref 39–117)
Bilirubin, Direct: 0.1 mg/dL (ref 0.0–0.3)
Total Bilirubin: 0.5 mg/dL (ref 0.2–1.2)
Total Protein: 6.7 g/dL (ref 6.0–8.3)

## 2020-02-14 ENCOUNTER — Telehealth: Payer: Self-pay | Admitting: Gastroenterology

## 2020-02-14 NOTE — Telephone Encounter (Signed)
See 7/19 results note

## 2020-02-18 ENCOUNTER — Ambulatory Visit: Payer: Medicare Other

## 2020-02-18 ENCOUNTER — Other Ambulatory Visit: Payer: Self-pay

## 2020-02-18 DIAGNOSIS — E1169 Type 2 diabetes mellitus with other specified complication: Secondary | ICD-10-CM

## 2020-02-18 DIAGNOSIS — I10 Essential (primary) hypertension: Secondary | ICD-10-CM

## 2020-02-18 NOTE — Chronic Care Management (AMB) (Addendum)
Chronic Care Management Pharmacy  Name: Justin Benson  MRN: [id number] DOB: 1935-03-06  Chief Complaint/ HPI  Justin Benson,  85 y.o., male presents for their Initial CCM visit with the clinical pharmacist via telephone.  PCP : [name], MD  Their chronic conditions include: HTN, COPD, diverticulosis, alcoholic hepatitis, dysphagia, type 2 DM, hyperlipidemia, borderline hypothyroidism, BPH, MDD, IDA, chronic pain   Office Visits:  01/06/20: PCP - 4 month f/u visit, seen by GI 4/21, still no appetite, no weight gain; fall in April at [facility] with residual shoulder pain, has not taken anything for it, ongoing knee pain due to sudden turn in kitchen, uses brace PRN; Saw GI [name]) 09/2019 for GERD on daily omeprazole, IDA and chronically elevated liver function (reassuring workup). Advised to stop statin for 3 months then reassess LFTs. Advised add pepcid to regimen to combat morning nausea, this has significantly helped. rec f/u labs then OV 01/2020.   Consult Visit:  10/13/19: [name] - IDA - good improvement after iron supplements, ferritin normal, hgb 12.4, chronic elevated liver tests since 2014, heavy drinker until stopped alcohol 14 months ago, chronic GERD on omeprazole daily, nausea 3-4 mornings out of the month, plan to order labs to determine cause of elevated LFTs, if no cause, will hold statin for 2-3 months to see if LFT profile improves, no signs of cirrhosis, recommend trial of Pepcid 20 mg at bedtime for morning nausea  Medications: Outpatient Encounter Medications as of 02/18/2020  Medication Sig  . amLODipine (NORVASC) 10 MG tablet TAKE 1 TABLET BY MOUTH  DAILY  . famotidine (PEPCID) 20 MG tablet TAKE 1 TABLET BY MOUTH EVERYDAY AT BEDTIME  . ferrous sulfate 325 (65 FE) MG tablet Take 1 tablet (325 mg total) by mouth every other day.  . finasteride (PROSCAR) 5 MG tablet TAKE 1 TABLET BY MOUTH IN  THE EVENING  . levothyroxine (SYNTHROID) 25 MCG tablet TAKE 1 TABLET BY  MOUTH  DAILY BEFORE BREAKFAST  . metFORMIN (GLUCOPHAGE-XR) 500 MG 24 hr tablet TAKE 2 TABLETS BY MOUTH  TWICE DAILY  . Multiple Vitamin (MULTIVITAMIN WITH MINERALS) TABS Take 1 tablet by mouth daily.  Marland Kitchen omeprazole (PRILOSEC) 40 MG capsule Take 1 capsule (40 mg total) by mouth daily.  . quinapril (ACCUPRIL) 20 MG tablet TAKE 1 TABLET BY MOUTH AT  BEDTIME  . quinapril-hydrochlorothiazide (ACCURETIC) 20-25 MG tablet TAKE 1 TABLET BY MOUTH  DAILY  . sertraline (ZOLOFT) 50 MG tablet TAKE 1 TABLET BY MOUTH  DAILY   No facility-administered encounter medications on file as of 02/18/2020.   Current Diagnosis/Assessment:  SDOH Interventions     Most Recent Value  SDOH Interventions  Financial Strain Interventions Intervention Not Indicated     Goals Addressed            This Visit's Progress   . Pharmacy Care Plan       CARE PLAN ENTRY  Current Barriers:  . Chronic Disease Management support, education, and care coordination needs related to Hypertension and Diabetes   Hypertension BP Readings from Last 3 Encounters:  01/18/20 132/60  01/06/20 (!) 142/66  11/18/19 (!) 169/70 .  Home blood pressure monitoring: weekly basis, 120-130s/60s . Pharmacist Clinical Goal(s): o Over the next 30 days, patient will work with PharmD and providers to maintain BP goal <140/90 mmHg . Current regimen:   Quinapril 20 mg/HCTZ 25 mg - 1 tablet daily  Quinapril 20 mg - 1 tablet daily  Amlodipine 10 mg - 1 tablet  daily . Interventions: o Reviewed home blood pressure monitoring . Patient self care activities - Over the next 30 days, patient will: o Continue current medications as prescribed   Diabetes Lab Results  Component Value Date/Time   HGBA1C 7.4 (H) 09/02/2019 09:03 AM   HGBA1C 7.3 (H) 05/07/2019 11:46 AM .  Pharmacist Clinical Goal(s): o Over the next 30 days, patient will work with PharmD and providers to achieve A1c goal <7% . Current regimen:  o Metformin 500 mg XR - 2 tablets  twice daily with meals  . Interventions: o Recommend checking blood glucose for the next 4 weeks to assess control . Patient self care activities - Over the next 30 days, patient will: o Check blood sugar every morning for 2 weeks, then switch to 2 hours after the first bite of breakfast or dinner for 2 weeks, document, and provide at future appointment o Contact provider with any episodes of hypoglycemia (blood glucose < 70)  o Schedule comprehensive eye exam   Initial goal documentation      Hypertension   CMP Latest Ref Rng & Units 02/11/2020 09/02/2019 05/07/2019  Glucose 70 - 99 mg/dL - 151(H) 106(H)  BUN 6 - 23 mg/dL - 20 20  Creatinine 0.40 - 1.50 mg/dL - 0.80 0.75  Sodium 135 - 145 mEq/L - 137 138  Potassium 3.5 - 5.1 mEq/L - 4.5 4.4  Chloride 96 - 112 mEq/L - 100 99  CO2 19 - 32 mEq/L - 33(H) 33(H)  Calcium 8.4 - 10.5 mg/dL - 9.5 9.9  Total Protein 6.0 - 8.3 g/dL 6.7 6.8 7.0  Total Bilirubin 0.2 - 1.2 mg/dL 0.5 0.5 0.5  Alkaline Phos 39 - 117 U/L 210(H) 134(H) 141(H)  AST 0 - 37 U/L 154(H) 163(H) 111(H)  ALT 0 - 53 U/L 201(H) 156(H) 103(H)   Office blood pressures are: BP Readings from Last 3 Encounters:  01/18/20 132/60  01/06/20 (!) 142/66  11/18/19 (!) 169/70   Patient has failed these meds in the past: none reported Patient checks BP at home: checked daily for a long time, currently checking once weekly  Patient home BP readings are ranging: 120-130s/60s  BP goal < 140/90 mmHg (ADA) Patient is currently controlled on the following medications:   Quinapril 20 mg/HCTZ 25 mg - 1 tablet daily  Quinapril 20 mg - 1 tablet daily  Amlodipine 10 mg - 1 tablet daily  Adherence: pt confirms adherence and < 5 day gap between refills We discussed: patient denies cost concerns, has been on this combination since 2008, dosing appropriate, denies concerns   Plan: Continue current medications  Hyperlipidemia   LDL goal < 70  Lipid Panel     Component Value Date/Time    CHOL 142 09/02/2019 0903   TRIG 101.0 09/02/2019 0903   HDL 58.00 09/02/2019 0903   LDLCALC 63 09/02/2019 0903   LDLDIRECT 84.5 05/26/2012 0836    Hepatic Function Latest Ref Rng & Units 02/11/2020 09/02/2019 05/07/2019  Total Protein 6.0 - 8.3 g/dL 6.7 6.8 7.0  Albumin 3.5 - 5.2 g/dL 4.4 4.4 4.7  AST 0 - 37 U/L 154(H) 163(H) 111(H)  ALT 0 - 53 U/L 201(H) 156(H) 103(H)  Alk Phosphatase 39 - 117 U/L 210(H) 134(H) 141(H)  Total Bilirubin 0.2 - 1.2 mg/dL 0.5 0.5 0.5  Bilirubin, Direct 0.0 - 0.3 mg/dL 0.1 - -    The ASCVD Risk score Mikey Bussing DC Jr., et al., 2013) failed to calculate for the following reasons:   The  2013 ASCVD risk score is only valid for ages 56 to 63   Patient has failed these meds in past: none Patient is currently on the following medications:  . No pharmacotherapy  We discussed: Lipid panel from 02/21 patient was on atorvastatin 10 mg, placed on hold per GI in March until next follow up 03/22/20 in order to assess LFTs  Exercise: walks around neighborhood daily, mows lawn  Plan: Continue control with diet and exercise until next GI follow up.  COPD / Tobacco   Last spirometry score: none per medical record  Eosinophil count:   Lab Results  Component Value Date/Time   EOSPCT 2.1 09/02/2019 09:03 AM  %                               Eos (Absolute):  Lab Results  Component Value Date/Time   EOSABS 0.1 09/02/2019 09:03 AM   Tobacco Status:  Social History   Tobacco Use  Smoking Status Former Smoker  . Packs/day: 2.00  . Years: 30.00  . Pack years: 60.00  . Types: Cigarettes  . Start date: 07/29/1962  . Quit date: 07/29/1992  . Years since quitting: 27.5  Smokeless Tobacco Never Used   Patient has failed these meds in past: none reported Patient is currently controlled on the following medications:   No pharmacotherapy  We discussed: denies SOB or limitation of activity  Plan: Continue management without medications   Diabetes   Recent Relevant  Labs: Lab Results  Component Value Date/Time   HGBA1C 7.4 (H) 09/02/2019 09:03 AM   HGBA1C 7.3 (H) 05/07/2019 11:46 AM   MICROALBUR <0.7 06/02/2015 08:28 AM   MICROALBUR 1.0 05/25/2013 08:39 AM    Checking BG: last checked about a month ago, reports he checked for a while but stopped because numbers are good  Hypoglycemia: denies, however, not checking BG, reports he usually feels real hungry/weak if BG is low and has noticed hunger in the mornings lately  A1c goal < 7% Patient has failed these meds in past: none  Patient is currently uncontrolled on the following medications:   Metformin 500 mg XR - 2 tablets BID  Last diabetic eye exam: reports missed eye exam last year due to COVID, plans to reschedule soon Last diabetic foot exam: PCP checks feet at each appt   We discussed: pt reports he is still losing weight, current: 125 lbs., appetite is poor, lives alone and doesn't cook often, makes a large pot of soup or chicken salad, not comfortable eating out at this point due to COVID --> recommend checking BG to make sure BG has not dropped with weight loss/appetite changes   Diet: Reads food labels, tries to choose low sodium, low fat, and low carb items  Breakfast: eggs or cereal (plain cheerios with pumpkin seed and flax granola and half sliced peach with parmalat milk)  Lunch/Dinner: Eats a log of vegetables from his garden (greens, tomatoes, squash), soup, chicken salad  Snacks: Reports he was eating a lot of ice cream sandwiches prior to last A1c, has tried to avoid these recently, reports eating a lot of fruit, discussed limiting portions and spreading out snacks/small meals throughout the day, discussed carb goals/meal  Plan: Continue current medications; Begin monitoring BG in the morning for 2 weeks, then 2 hours after meal for 2 weeks.   Hypothyroidism   Lab Results  Component Value Date/Time   TSH 3.64 09/02/2019 09:03 AM  TSH 3.41 05/07/2019 11:46 AM   FREET4 1.04  05/07/2019 11:46 AM   FREET4 0.89 01/01/2019 10:57 AM   Patient has failed these meds in past: none Patient is currently controlled on the following medications:  . Levothyroxine 25 mcg - 1 tablet daily before breakfast  We discussed:  Started 12/2018, noticed improvement in energy  Plan: Continue current medications  Depression   Patient has failed these meds in past: none  Patient is currently controlled on the following medications:   Sertraline 50 mg - 1 tablet daily  We discussed: patient reports mood is well controlled, avoids missed doses; would like to come off of it but has noticed when he tries to reduce it, irritability worsens  Plan: Continue current medications   Dysphagia   Patient has failed these meds in past: none  Patient is currently controlled on the following medications:   Omeprazole 40 mg - 1 capsule daily  Famotidine 20 mg - 1 tablet daily at bedtime   We discussed: reports doing much better since adding famotidine, denies any reoccurrence of nausea  Plan: Continue current medications   IDA   Iron/TIBC/Ferritin/ %Sat    Component Value Date/Time   IRON 86 10/13/2019 1001   IRON 76 10/13/2019 1001   TIBC 378 10/13/2019 1001   FERRITIN 38.4 10/13/2019 1001   IRONPCTSAT 21.3 10/13/2019 1001   IRONPCTSAT 20 10/13/2019 1001   CBC Latest Ref Rng & Units 09/02/2019 05/07/2019 01/01/2019  WBC 4.0 - 10.5 K/uL 4.2 4.7 4.5  Hemoglobin 13.0 - 17.0 g/dL 13.4 13.3 12.9(L)  Hematocrit 39 - 52 % 40.8 39.4 37.6(L)  Platelets 150 - 400 K/uL 207.0 208.0 188.0   Patient has failed these meds in past: none Patient is currently controlled on the following medications:   Ferrous sulfate 325 mg - 1 tablet every other day  We discussed: confirms adherence, denies constipation, ferritin and CBC WNL  Plan: Continue current medications   BPH   Patient has failed these meds in past: tamsulosin (per chart, ineffective) Patient is currently controlled on the  following medications:   (Proscar) Finasteride 5 mg - 1 tablet daily  We discussed: reports main concern is dribbling/weak stream, worsened in mornings after coffee and HCTZ, denies interest in medication changes, believes its age related  Plan: Continue current medications   Medication Management   Med changes updated 02/23/20 per GI --> Patient will hold amlodipine for 3 months then repeat LFTs. Start carvedilol 3.125 BID per Dr. Danise Mina to replace amlodipine. Resume atorvastatin 10 mg - 1/2 tablet at bedtime.   OTCs: Multivitamin  Current pharmacy: [facility] mail order for maintenance medications, acute meds from [facility] Adherence: no medications with > 5 day gap between fills, uses pillbox  Affordability: no concerns, most medications are $7-8 for 90 day supply  We discussed: Patient asked about help with organizing medications. Offered [employer] services for adherence packaging and medication coordination. Compared prices for patient. He would like to switch to [facility] for synchronization and pill packaging. Reviewed timing of all of his medications as well as his supply on hand to develop sync plan.   Verbal consent obtained for [facility] enhanced pharmacy services (medication synchronization, adherence packaging, delivery coordination). A medication sync plan was created to allow patient to get all medications delivered once every 30 to 90 days per patient preference. Patient understands they have freedom to choose pharmacy and clinical pharmacist will coordinate care between all prescribers and [facility].  Plan: Begin utilizing [employer] for  pharmacy services  CCM Follow Up:  4 weeks, telephone visit BG log   Debbora Dus, PharmD Clinical Pharmacist [place] [facility] [phone]

## 2020-02-19 ENCOUNTER — Other Ambulatory Visit: Payer: Self-pay | Admitting: Family Medicine

## 2020-02-19 NOTE — Patient Instructions (Addendum)
Dear Justin Benson,  It was a pleasure meeting you during our initial appointment on February 18, 2020. Below is a summary of the goals we discussed and components of chronic care management. Please contact me anytime with questions or concerns.   Visit Information  Goals Addressed            This Visit's Progress   . Pharmacy Care Plan       CARE PLAN ENTRY  Current Barriers:  . Chronic Disease Management support, education, and care coordination needs related to Hypertension and Diabetes   Hypertension BP Readings from Last 3 Encounters:  01/18/20 132/60  01/06/20 (!) 142/66  11/18/19 (!) 169/70 .  Home blood pressure monitoring: weekly basis, 120-130s/60s . Pharmacist Clinical Goal(s): o Over the next 30 days, patient will work with PharmD and providers to maintain BP goal <140/90 mmHg . Current regimen:   Quinapril 20 mg/HCTZ 25 mg - 1 tablet daily  Quinapril 20 mg - 1 tablet daily  Amlodipine 10 mg - 1 tablet daily . Interventions: o Reviewed home blood pressure monitoring . Patient self care activities - Over the next 30 days, patient will: o Continue current medications as prescribed   Diabetes Lab Results  Component Value Date/Time   HGBA1C 7.4 (H) 09/02/2019 09:03 AM   HGBA1C 7.3 (H) 05/07/2019 11:46 AM .  Pharmacist Clinical Goal(s): o Over the next 30 days, patient will work with PharmD and providers to achieve A1c goal <7% . Current regimen:  o Metformin 500 mg XR - 2 tablets twice daily with meals  . Interventions: o Recommend checking blood glucose for the next 4 weeks to assess control . Patient self care activities - Over the next 30 days, patient will: o Check blood sugar every morning for 2 weeks, then switch to 2 hours after the first bite of breakfast or dinner for 2 weeks, document, and provide at future appointment o Contact provider with any episodes of hypoglycemia (blood glucose < 70)  o Schedule comprehensive eye exam   Initial goal  documentation       Justin Benson was given information about Chronic Care Management services today including:  1. CCM service includes personalized support from designated clinical staff supervised by his physician, including individualized plan of care and coordination with other care providers 2. 24/7 contact phone numbers for assistance for urgent and routine care needs. 3. Standard insurance, coinsurance, copays and deductibles apply for chronic care management only during months in which we provide at least 20 minutes of these services. Most insurances cover these services at 100%, however patients may be responsible for any copay, coinsurance and/or deductible if applicable. This service may help you avoid the need for more expensive face-to-face services. 4. Only one practitioner may furnish and bill the service in a calendar month. 5. The patient may stop CCM services at any time (effective at the end of the month) by phone call to the office staff.  Patient agreed to services and verbal consent obtained.   Verbal consent obtained for [facility] enhanced pharmacy services (medication synchronization, adherence packaging, delivery coordination). A medication sync plan was created to allow patient to get all medications delivered once every 30 to 90 days per patient preference. Patient understands they have freedom to choose pharmacy and clinical pharmacist will coordinate care between all prescribers and [facility].  The patient verbalized understanding of instructions provided today and agreed to receive a mailed copy of patient instruction and/or educational materials. Telephone  follow up appointment with pharmacy team member scheduled for: March 20, 2020 at 8 AM (telephone)  Debbora Dus, PharmD Clinical Pharmacist [facility] [place]   Carbohydrate Counting for Diabetes Mellitus, Adult  Carbohydrate counting is a method of keeping  track of how many carbohydrates you eat. Eating carbohydrates naturally increases the amount of sugar (glucose) in the blood. Counting how many carbohydrates you eat helps keep your blood glucose within normal limits, which helps you manage your diabetes (diabetes mellitus). It is important to know how many carbohydrates you can safely have in each meal. This is different for every person. A diet and nutrition specialist (registered dietitian) can help you make a meal plan and calculate how many carbohydrates you should have at each meal and snack. Carbohydrates are found in the following foods:  Grains, such as breads and cereals.  Dried beans and soy products.  Starchy vegetables, such as potatoes, peas, and corn.  Fruit and fruit juices.  Milk and yogurt.  Sweets and snack foods, such as cake, cookies, candy, chips, and soft drinks. How do I count carbohydrates? There are two ways to count carbohydrates in food. You can use either of the methods or a combination of both. Reading "Nutrition Facts" on packaged food The "Nutrition Facts" list is included on the labels of almost all packaged foods and beverages in the [place] It includes:  The serving size.  Information about nutrients in each serving, including the grams (g) of carbohydrate per serving. To use the "Nutrition Facts":  Decide how many servings you will have.  Multiply the number of servings by the number of carbohydrates per serving.  The resulting number is the total amount of carbohydrates that you will be having. Learning standard serving sizes of other foods When you eat carbohydrate foods that are not packaged or do not include "Nutrition Facts" on the label, you need to measure the servings in order to count the amount of carbohydrates:  Measure the foods that you will eat with a food scale or measuring cup, if needed.  Decide how many standard-size servings you will eat.  Multiply the number of servings by 15.  Most carbohydrate-rich foods have about 15 g of carbohydrates per serving. ? For example, if you eat 8 oz (170 g) of strawberries, you will have eaten 2 servings and 30 g of carbohydrates (2 servings x 15 g = 30 g).  For foods that have more than one food mixed, such as soups and casseroles, you must count the carbohydrates in each food that is included. The following list contains standard serving sizes of common carbohydrate-rich foods. Each of these servings has about 15 g of carbohydrates:   hamburger bun or  English muffin.   oz (15 mL) syrup.   oz (14 g) jelly.  1 slice of bread.  1 six-inch tortilla.  3 oz (85 g) cooked rice or pasta.  4 oz (113 g) cooked dried beans.  4 oz (113 g) starchy vegetable, such as peas, corn, or potatoes.  4 oz (113 g) hot cereal.  4 oz (113 g) mashed potatoes or  of a large baked potato.  4 oz (113 g) canned or frozen fruit.  4 oz (120 mL) fruit juice.  4-6 crackers.  6 chicken nuggets.  6 oz (170 g) unsweetened dry cereal.  6 oz (170 g) plain fat-free yogurt or yogurt sweetened with artificial sweeteners.  8 oz (240 mL) milk.  8 oz (170 g) fresh  fruit or one small piece of fruit.  24 oz (680 g) popped popcorn. Example of carbohydrate counting Sample meal  3 oz (85 g) chicken breast.  6 oz (170 g) brown rice.  4 oz (113 g) corn.  8 oz (240 mL) milk.  8 oz (170 g) strawberries with sugar-free whipped topping. Carbohydrate calculation 1. Identify the foods that contain carbohydrates: ? Rice. ? Corn. ? Milk. ? Strawberries. 2. Calculate how many servings you have of each food: ? 2 servings rice. ? 1 serving corn. ? 1 serving milk. ? 1 serving strawberries. 3. Multiply each number of servings by 15 g: ? 2 servings rice x 15 g = 30 g. ? 1 serving corn x 15 g = 15 g. ? 1 serving milk x 15 g = 15 g. ? 1 serving strawberries x 15 g = 15 g. 4. Add together all of the amounts to find the total grams of carbohydrates  eaten: ? 30 g + 15 g + 15 g + 15 g = 75 g of carbohydrates total. Summary  Carbohydrate counting is a method of keeping track of how many carbohydrates you eat.  Eating carbohydrates naturally increases the amount of sugar (glucose) in the blood.  Counting how many carbohydrates you eat helps keep your blood glucose within normal limits, which helps you manage your diabetes.  A diet and nutrition specialist (registered dietitian) can help you make a meal plan and calculate how many carbohydrates you should have at each meal and snack. This information is not intended to replace advice given to you by your health care provider. Make sure you discuss any questions you have with your health care provider. Document Revised: 02/06/2017 Document Reviewed: 12/27/2015 Elsevier Patient Education  [place].

## 2020-02-21 NOTE — Telephone Encounter (Signed)
Rx was last refilled 07/28/19 for #90 with 0 refills.  Patient was last seen on 01/18/20. Ok to refill?

## 2020-02-22 ENCOUNTER — Other Ambulatory Visit: Payer: Self-pay

## 2020-02-22 ENCOUNTER — Other Ambulatory Visit: Payer: Self-pay | Admitting: Family Medicine

## 2020-02-22 DIAGNOSIS — R748 Abnormal levels of other serum enzymes: Secondary | ICD-10-CM

## 2020-02-22 MED ORDER — CARVEDILOL 3.125 MG PO TABS
3.1250 mg | ORAL_TABLET | Freq: Two times a day (BID) | ORAL | 3 refills | Status: DC
Start: 2020-02-22 — End: 2020-02-28

## 2020-02-23 ENCOUNTER — Telehealth: Payer: Self-pay | Admitting: Gastroenterology

## 2020-02-23 NOTE — Telephone Encounter (Signed)
I spoke with Sharyn Lull and discussed with her the changes Dr [name] recommends.  I also helped and showed her where the changes are listed in Epic.

## 2020-02-28 ENCOUNTER — Ambulatory Visit (INDEPENDENT_AMBULATORY_CARE_PROVIDER_SITE_OTHER): Payer: Medicare Other | Admitting: Family Medicine

## 2020-02-28 ENCOUNTER — Encounter: Payer: Self-pay | Admitting: Family Medicine

## 2020-02-28 VITALS — BP 154/62 | HR 73 | Temp 97.6°F | Ht 67.0 in | Wt 129.2 lb

## 2020-02-28 DIAGNOSIS — E785 Hyperlipidemia, unspecified: Secondary | ICD-10-CM

## 2020-02-28 DIAGNOSIS — R1310 Dysphagia, unspecified: Secondary | ICD-10-CM

## 2020-02-28 DIAGNOSIS — R7401 Elevation of levels of liver transaminase levels: Secondary | ICD-10-CM

## 2020-02-28 DIAGNOSIS — I10 Essential (primary) hypertension: Secondary | ICD-10-CM | POA: Diagnosis not present

## 2020-02-28 DIAGNOSIS — K5900 Constipation, unspecified: Secondary | ICD-10-CM | POA: Insufficient documentation

## 2020-02-28 DIAGNOSIS — D500 Iron deficiency anemia secondary to blood loss (chronic): Secondary | ICD-10-CM | POA: Diagnosis not present

## 2020-02-28 DIAGNOSIS — E1169 Type 2 diabetes mellitus with other specified complication: Secondary | ICD-10-CM

## 2020-02-28 MED ORDER — POLYETHYLENE GLYCOL 3350 17 GM/SCOOP PO POWD
8.5000 g | Freq: Every day | ORAL | 1 refills | Status: AC | PRN
Start: 2020-02-28 — End: ?

## 2020-02-28 MED ORDER — ATORVASTATIN CALCIUM 10 MG PO TABS: 5.0000 mg | ORAL_TABLET | Freq: Every day | ORAL | 3 refills | Status: DC

## 2020-02-28 NOTE — Assessment & Plan Note (Signed)
Stopping lipitor did not resolve transaminitis. Will restart (refilled today)

## 2020-02-28 NOTE — Patient Instructions (Addendum)
Your heart rate is on the low side. Don't start carvedilol.  Continue to monitor blood pressures, let me know if consistently >150/100 at home to discuss additional blood pressure medicine.  Continue regular fruits/vegetables and water intake. Add stool softener or 1/2 capful miralax daily.

## 2020-02-28 NOTE — Assessment & Plan Note (Signed)
Continues oral iron QOD.  

## 2020-02-28 NOTE — Assessment & Plan Note (Signed)
Incidentally noted on recent CT abd/pelvis. He is on oral iron daily. Discussed bowel regimen. rec start colace or miralax 1/2 capful daily, hold for diarrhea. Update with effect.

## 2020-02-28 NOTE — Assessment & Plan Note (Addendum)
Chronic, now seeing Dr [name]. Appreciate GI care.  Reviewed reasoning behind recent med changes. No improvement off statin - now trying off amlodipine.

## 2020-02-28 NOTE — Assessment & Plan Note (Addendum)
BP deteriorated off amlodipine but home readings largely adequate. He hadn't yet started carvedilol. Given baseline bradycardia noted on home log, rec not starting. Will continue to monitor BP at home, let me know if consistently >150/100 to add another antihypertensive

## 2020-02-28 NOTE — Assessment & Plan Note (Addendum)
Improved on PPI + pepcid but persists. Has GI f/u at end of month.

## 2020-02-28 NOTE — Progress Notes (Signed)
This visit was conducted in person.  BP (!) 154/62 (BP Location: Right Arm, Patient Position: Sitting, Cuff Size: Normal)   Pulse 73   Temp 97.6 F (36.4 C) (Temporal)   Ht 5\' 7"  (1.702 m)   Wt 129 lb 3 oz (58.6 kg)   SpO2 97%   BMI 20.23 kg/m   BP Readings from Last 3 Encounters:  02/28/20 (!) 154/62  01/18/20 132/60  01/06/20 (!) 142/66  BP 170/66 on repeat   CC: discuss med changes  Subjective:    Patient ID: Justin Benson, male    DOB: [birth year], 85 y.o.   MRN: [id number]  HPI: Justin Benson is a 85 y.o. male presenting on 02/28/2020 for Discss Medication (Wants discuss changes in meds from Dr. Darnell Level and/or GI. )   See recent lab visit results.  Saw GI for transaminitis - no improvement despite topping statin for 3-4 months. Back on statin. Was recommended to hold amlodipine 10mg  for 3 months to see if improvement in liver function. We have started carvedilol 3.125mg  bid in place of amlodipine. He stopped amlodipine 02/23/2020.   Here today to discuss changes. He is very confused about recent med changes. Reviewed reasoning behind recent recommendations.   Brings log of home BP readings off amlodipine - 130-140/60s, HR 50-60s.   GERD on omeprazole 40mg  daily + pepcid with improvement in GERD. Still notes some ongoing episodes of strangulation with food or coffee (ie this morning). Had coughing fit this morning.   CT from 08/2019 - significant stool burden - he feels this is improving with increased fruits/vegetables daily.      Relevant past medical, surgical, family and social history reviewed and updated as indicated. Interim medical history since our last visit reviewed. Allergies and medications reviewed and updated. Outpatient Medications Prior to Visit  Medication Sig Dispense Refill  . famotidine (PEPCID) 20 MG tablet TAKE 1 TABLET BY MOUTH EVERYDAY AT BEDTIME 30 tablet 11  . ferrous sulfate 325 (65 FE) MG tablet Take 1 tablet (325 mg total) by mouth every other day.   3  . finasteride (PROSCAR) 5 MG tablet TAKE 1 TABLET BY MOUTH IN  THE EVENING 90 tablet 3  . levothyroxine (SYNTHROID) 25 MCG tablet TAKE 1 TABLET BY MOUTH  DAILY BEFORE BREAKFAST 90 tablet 2  . metFORMIN (GLUCOPHAGE-XR) 500 MG 24 hr tablet TAKE 2 TABLETS BY MOUTH  TWICE DAILY 360 tablet 1  . Multiple Vitamin (MULTIVITAMIN WITH MINERALS) TABS Take 1 tablet by mouth daily.    Marland Kitchen omeprazole (PRILOSEC) 40 MG capsule Take 1 capsule (40 mg total) by mouth daily. 90 capsule 3  . quinapril (ACCUPRIL) 20 MG tablet TAKE 1 TABLET BY MOUTH AT  BEDTIME 90 tablet 3  . quinapril-hydrochlorothiazide (ACCURETIC) 20-25 MG tablet TAKE 1 TABLET BY MOUTH  DAILY 90 tablet 3  . sertraline (ZOLOFT) 50 MG tablet TAKE 1 TABLET BY MOUTH  DAILY 90 tablet 3  . amLODipine (NORVASC) 10 MG tablet TAKE 1 TABLET BY MOUTH  DAILY (Patient not taking: Reported on 02/28/2020) 90 tablet 3  . carvedilol (COREG) 3.125 MG tablet Take 1 tablet (3.125 mg total) by mouth 2 (two) times daily with a meal. (Patient not taking: Reported on 02/28/2020) 60 tablet 3   No facility-administered medications prior to visit.     Per HPI unless specifically indicated in ROS section below Review of Systems Objective:  BP (!) 154/62 (BP Location: Right Arm, Patient Position: Sitting, Cuff Size: Normal)   Pulse  73   Temp 97.6 F (36.4 C) (Temporal)   Ht 5\' 7"  (1.702 m)   Wt 129 lb 3 oz (58.6 kg)   SpO2 97%   BMI 20.23 kg/m   Wt Readings from Last 3 Encounters:  02/28/20 129 lb 3 oz (58.6 kg)  01/18/20 132 lb 4 oz (60 kg)  01/06/20 132 lb 1.6 oz (59.9 kg)      Physical Exam Vitals and nursing note reviewed.  Constitutional:      Appearance: Normal appearance. He is not ill-appearing.  Eyes:     Extraocular Movements: Extraocular movements intact.     Pupils: Pupils are equal, round, and reactive to light.  Cardiovascular:     Rate and Rhythm: Normal rate and regular rhythm.     Pulses: Normal pulses.     Heart sounds: Normal heart  sounds. No murmur heard.   Pulmonary:     Effort: Pulmonary effort is normal. No respiratory distress.     Breath sounds: Normal breath sounds. No wheezing, rhonchi or rales.  Musculoskeletal:     Cervical back: Normal range of motion and neck supple.     Right lower leg: No edema.     Left lower leg: No edema.  Neurological:     Mental Status: He is alert.  Psychiatric:        Mood and Affect: Mood normal.        Behavior: Behavior normal.       Results for orders placed or performed in visit on 02/11/20  Hepatic function panel  Result Value Ref Range   Total Bilirubin 0.5 0.2 - 1.2 mg/dL   Bilirubin, Direct 0.1 0.0 - 0.3 mg/dL   Alkaline Phosphatase 210 (H) 39 - 117 U/L   AST 154 (H) 0 - 37 U/L   ALT 201 (H) 0 - 53 U/L   Total Protein 6.7 6.0 - 8.3 g/dL   Albumin 4.4 3.5 - 5.2 g/dL   Lab Results  Component Value Date   HGBA1C 7.4 (H) 09/02/2019    Assessment & Plan:  This visit occurred during the SARS-CoV-2 public health emergency.  Safety protocols were in place, including screening questions prior to the visit, additional usage of staff PPE, and extensive cleaning of exam room while observing appropriate contact time as indicated for disinfecting solutions.   Problem List Items Addressed This Visit    Transaminitis    Chronic, now seeing Dr [name]. Appreciate GI care.  Reviewed reasoning behind recent med changes. No improvement off statin - now trying off amlodipine.       Iron deficiency anemia    Continues oral iron QOD      Hyperlipidemia associated with type 2 diabetes mellitus (Micro)    Stopping lipitor did not resolve transaminitis. Will restart (refilled today)      Relevant Medications   atorvastatin (LIPITOR) 10 MG tablet   Essential hypertension - Primary    BP deteriorated off amlodipine but home readings largely adequate. He hadn't yet started carvedilol. Given baseline bradycardia noted on home log, rec not starting. Will continue to monitor BP at  home, let me know if consistently >150/100 to add another antihypertensive      Relevant Medications   atorvastatin (LIPITOR) 10 MG tablet   Dysphagia    Improved on PPI + pepcid but persists. Has GI f/u at end of month.       Constipation    Incidentally noted on recent CT abd/pelvis. He is on oral iron  daily. Discussed bowel regimen. rec start colace or miralax 1/2 capful daily, hold for diarrhea. Update with effect.           Meds ordered this encounter  Medications  . atorvastatin (LIPITOR) 10 MG tablet    Sig: Take 0.5 tablets (5 mg total) by mouth daily.    Dispense:  45 tablet    Refill:  3  . polyethylene glycol powder (GLYCOLAX/MIRALAX) 17 GM/SCOOP powder    Sig: Take 8.5 g by mouth daily as needed for moderate constipation. Hold for diarrhea    Dispense:  3350 g    Refill:  1   No orders of the defined types were placed in this encounter.   Patient Instructions  Your heart rate is on the low side. Don't start carvedilol.  Continue to monitor blood pressures, let me know if consistently >150/100 at home to discuss additional blood pressure medicine.  Continue regular fruits/vegetables and water intake. Add stool softener or 1/2 capful miralax daily.   Follow up plan: Return if symptoms worsen or fail to improve.  [name], MD

## 2020-03-15 ENCOUNTER — Ambulatory Visit: Payer: Medicare Other | Admitting: Family Medicine

## 2020-03-15 NOTE — Chronic Care Management (AMB) (Signed)
Chronic Care Management Pharmacy  Name: [facility]  MRN: [id number] DOB: February 05, 1935  Chief Complaint/ HPI  [name],  85 y.o., male presents for their Follow-Up CCM visit with the clinical pharmacist via telephone.  PCP : [name], MD  Their chronic conditions include: HTN, COPD, diverticulosis, alcoholic hepatitis, dysphagia, type 2 DM, hyperlipidemia, borderline hypothyroidism, BPH, MDD, IDA, chronic pain   Office Visits:  02/28/2020 - Back on statin. Was recommended to hold amlodipine 39m for 3 months to see if improvement in liver function. Recommended not starting carvedilol due to bradycardia. He stopped amlodipine 02/23/2020. Recommended adding stool softener or miralax.   01/06/20: PCP - 4 month f/u visit, seen by GI 4/21, still no appetite, no weight gain[facility], ongoing knee pain due to sudden turn in kitchen, uses brace PRN; Saw GI ([name] 09/2019 for GERD on daily omeprazole, IDA and chronically elevated liver function (reassuring workup). Advised to stop statin for 3 months then reassess LFTs. Advised add pepcid to regimen to combat morning nausea, this has significantly helped. rec f/u labs then OV 01/2020.   Consult Visit:  10/13/19: GGertie Fey- IDA - good improvement after iron supplements, ferritin normal, hgb 12.4, chronic elevated liver tests since 2014, heavy drinker until stopped alcohol 14 months ago, chronic GERD on omeprazole daily, nausea 3-4 mornings out of the month, plan to order labs to determine cause of elevated LFTs, if no cause, will hold statin for 2-3 months to see if LFT profile improves, no signs of cirrhosis, recommend trial of Pepcid 20 mg at bedtime for morning nausea  Medications: Outpatient Encounter Medications as of 03/20/2020  Medication Sig  . amLODipine (NORVASC) 10 MG tablet TAKE 1 TABLET BY MOUTH  DAILY (Patient not taking: Reported on 02/28/2020)  .  atorvastatin (LIPITOR) 10 MG tablet Take 0.5 tablets (5 mg total) by mouth daily.  . famotidine (PEPCID) 20 MG tablet TAKE 1 TABLET BY MOUTH EVERYDAY AT BEDTIME  . ferrous sulfate 325 (65 FE) MG tablet Take 1 tablet (325 mg total) by mouth every other day.  . finasteride (PROSCAR) 5 MG tablet TAKE 1 TABLET BY MOUTH IN  THE EVENING  . levothyroxine (SYNTHROID) 25 MCG tablet TAKE 1 TABLET BY MOUTH  DAILY BEFORE BREAKFAST  . metFORMIN (GLUCOPHAGE-XR) 500 MG 24 hr tablet TAKE 2 TABLETS BY MOUTH  TWICE DAILY  . Multiple Vitamin (MULTIVITAMIN WITH MINERALS) TABS Take 1 tablet by mouth daily.  .Marland Kitchenomeprazole (PRILOSEC) 40 MG capsule Take 1 capsule (40 mg total) by mouth daily.  . polyethylene glycol powder (GLYCOLAX/MIRALAX) 17 GM/SCOOP powder Take 8.5 g by mouth daily as needed for moderate constipation. Hold for diarrhea  . quinapril (ACCUPRIL) 20 MG tablet TAKE 1 TABLET BY MOUTH AT  BEDTIME  . quinapril-hydrochlorothiazide (ACCURETIC) 20-25 MG tablet TAKE 1 TABLET BY MOUTH  DAILY  . sertraline (ZOLOFT) 50 MG tablet TAKE 1 TABLET BY MOUTH  DAILY  . [DISCONTINUED] omeprazole (PRILOSEC) 40 MG capsule Take 1 capsule (40 mg total) by mouth daily.   No facility-administered encounter medications on file as of 03/20/2020.   Current Diagnosis/Assessment:   Goals Addressed            This Visit's Progress   . Pharmacy Care Plan       CARE PLAN ENTRY  Current Barriers:  . Chronic Disease Management support, education, and care coordination needs related to Hypertension and Diabetes  Hypertension BP Readings from Last 3 Encounters:  01/18/20 132/60  01/06/20 (!) 142/66  11/18/19 (!) 169/70 .  Home blood pressure monitoring: weekly basis, 120-130s/60s . Pharmacist Clinical Goal(s): o Over the next 30 days, patient will work with PharmD and providers to maintain BP goal <140/90 mmHg . Current regimen:   Quinapril 20 mg/HCTZ 25 mg - 1 tablet daily  Quinapril 20 mg - 1 tablet  daily . Interventions: o Reviewed home blood pressure monitoring. Blood pressures consistently 130-140s/50-60s Pulse 50-60s. Marland Kitchen  o Discussed amlodipine discontinuation and patient's choice to defer carvedilol.  . Patient self care activities - Over the next 30 days, patient will: o Continue current medications as prescribed   Diabetes Lab Results  Component Value Date/Time   HGBA1C 7.4 (H) 09/02/2019 09:03 AM   HGBA1C 7.3 (H) 05/07/2019 11:46 AM .  Pharmacist Clinical Goal(s): o Over the next 30 days, patient will work with PharmD and providers to achieve A1c goal <7% . Current regimen:  o Metformin 500 mg XR - 2 tablets twice daily with meals  . Interventions: o Reviewed blood sugar readings. Fasting readings range: 94-137. Postprandial reading range: 115-219. The majority of patient's readings are at or below goal. Patient identified things that contributed to above goal readings.  . Patient self care activities - Over the next 30 days, patient will: o Continue to check blood sugar daily. o Contact provider with any episodes of hypoglycemia (blood glucose < 70)  o Schedule comprehensive eye exam   Please see past updates related to this goal by clicking on the "Past Updates" button in the selected goal       Hypertension   CMP Latest Ref Rng & Units 02/11/2020 09/02/2019 05/07/2019  Glucose 70 - 99 mg/dL - 151(H) 106(H)  BUN 6 - 23 mg/dL - 20 20  Creatinine 0.40 - 1.50 mg/dL - 0.80 0.75  Sodium 135 - 145 mEq/L - 137 138  Potassium 3.5 - 5.1 mEq/L - 4.5 4.4  Chloride 96 - 112 mEq/L - 100 99  CO2 19 - 32 mEq/L - 33(H) 33(H)  Calcium 8.4 - 10.5 mg/dL - 9.5 9.9  Total Protein 6.0 - 8.3 g/dL 6.7 6.8 7.0  Total Bilirubin 0.2 - 1.2 mg/dL 0.5 0.5 0.5  Alkaline Phos 39 - 117 U/L 210(H) 134(H) 141(H)  AST 0 - 37 U/L 154(H) 163(H) 111(H)  ALT 0 - 53 U/L 201(H) 156(H) 103(H)   Office blood pressures are: BP Readings from Last 3 Encounters:  02/28/20 (!) 154/62  01/18/20 132/60   01/06/20 (!) 142/66   Patient has failed these meds in the past: none reported Patient checks BP at home: checked daily for a long time, currently checking once weekly  Patient home BP readings are ranging: 120-130s/60s  BP goal < 140/90 mmHg (ADA) Patient is currently controlled on the following medications:   Quinapril 20 mg/HCTZ 25 mg - 1 tablet daily  Quinapril 20 mg - 1 tablet daily  Adherence: pt confirms adherence and < 5 day gap between refills We discussed: patient denies cost concerns, has been on this combination since 2008, dosing appropriate, denies concerns   Update 03/20/2020 - Blood pressure log: 131/66, 144/69, 133/64, 134/58, 149/61. Pulse 67, 64, 60, 62, 59, 64. Amlodipine was discontinued by GI. Patient chose not to start carvedilol and Dr. Darnell Level approved. Patient will contact Dr. Darnell Level if blood pressure consistently >150/100 mmHg.  Patient was going to the [facility] before [place]. Walks his neighborhood three times weekly or  more. He stays very active maintaining his yard.   Plan: Continue current medications  Hyperlipidemia   LDL goal < 70  Lipid Panel     Component Value Date/Time   CHOL 142 09/02/2019 0903   TRIG 101.0 09/02/2019 0903   HDL 58.00 09/02/2019 0903   LDLCALC 63 09/02/2019 0903   LDLDIRECT 84.5 05/26/2012 0836    Hepatic Function Latest Ref Rng & Units 02/11/2020 09/02/2019 05/07/2019  Total Protein 6.0 - 8.3 g/dL 6.7 6.8 7.0  Albumin 3.5 - 5.2 g/dL 4.4 4.4 4.7  AST 0 - 37 U/L 154(H) 163(H) 111(H)  ALT 0 - 53 U/L 201(H) 156(H) 103(H)  Alk Phosphatase 39 - 117 U/L 210(H) 134(H) 141(H)  Total Bilirubin 0.2 - 1.2 mg/dL 0.5 0.5 0.5  Bilirubin, Direct 0.0 - 0.3 mg/dL 0.1 - -    The ASCVD Risk score Mikey Bussing DC Jr., et al., 2013) failed to calculate for the following reasons:   The 2013 ASCVD risk score is only valid for ages 13 to 33   Patient has failed these meds in past: none Patient is currently on the following medications:  . Atorvastatin 5 mg  daily  We discussed: Lipid panel from 02/21 patient was on atorvastatin 10 mg, placed on hold per GI in March until next follow up 03/22/20 in order to assess LFTs  Update 03/20/2020 - Atorvastatin 5 mg daily has been resumed. Patient working to make healthy choices for food. He walks three times weekly in his neighborhood. Receiving medications via delivery through [facility]. Following-up with GI tomorrow.   Exercise: walks around neighborhood daily, mows lawn  Plan: Continue control with diet and exercise until next GI follow up.  COPD / Tobacco   Last spirometry score: none per medical record  Eosinophil count:   Lab Results  Component Value Date/Time   EOSPCT 2.1 09/02/2019 09:03 AM  %                               Eos (Absolute):  Lab Results  Component Value Date/Time   EOSABS 0.1 09/02/2019 09:03 AM   Tobacco Status:  Social History   Tobacco Use  Smoking Status Former Smoker  . Packs/day: 2.00  . Years: 30.00  . Pack years: 60.00  . Types: Cigarettes  . Start date: 07/29/1962  . Quit date: 07/29/1992  . Years since quitting: 27.6  Smokeless Tobacco Never Used   Patient has failed these meds in past: none reported Patient is currently controlled on the following medications:   No pharmacotherapy  We discussed: denies SOB or limitation of activity  Plan: Continue management without medications   Diabetes   Recent Relevant Labs: Lab Results  Component Value Date/Time   HGBA1C 7.4 (H) 09/02/2019 09:03 AM   HGBA1C 7.3 (H) 05/07/2019 11:46 AM   MICROALBUR <0.7 06/02/2015 08:28 AM   MICROALBUR 1.0 05/25/2013 08:39 AM    Checking BG: last checked about a month ago, reports he checked for a while but stopped because numbers are good  Hypoglycemia: denies, however, not checking BG, reports he usually feels real hungry/weak if BG is low and has noticed hunger in the mornings lately  A1c goal < 7% Patient has failed these meds in past: none  Patient is currently  uncontrolled on the following medications:   Metformin 500 mg XR - 2 tablets BID  Last diabetic eye exam: reports missed eye exam last year due to [place],  plans to reschedule soon Last diabetic foot exam: PCP checks feet at each appt   We discussed: pt reports he is still losing weight, current: 125 lbs., appetite is poor, lives alone and doesn't cook often, makes a large pot of soup or chicken salad, not comfortable eating out at this point due to COVID --> recommend checking BG to make sure BG has not dropped with weight loss/appetite changes   Diet: Reads food labels, tries to choose low sodium, low fat, and low carb items  Breakfast: eggs or cereal (plain cheerios with pumpkin seed and flax granola and half sliced peach with parmalat milk)  Lunch/Dinner: Eats a log of vegetables from his garden (greens, tomatoes, squash), soup, chicken salad  Snacks: Reports he was eating a lot of ice cream sandwiches prior to last A1c, has tried to avoid these recently, reports eating a lot of fruit, discussed limiting portions and spreading out snacks/small meals throughout the day, discussed carb goals/meal  Update 03/20/2020 - Checking blood sugars across the last month. Fasting blood sugars; 94, 129, 137, 107, 114, 132, 102, 131, 120, 132, 115, 115, 122, 107. 2 hour post prandial 166, 145, 158, 133, 219. Noon  Readings (~ 2 hours after food)170, 139, 150, 136, 128, 115. Patient denies low blood sugar or symptoms of low blood sugar.   Making notes on foods eating before higher readings. Tomato sandwiches make it more elevated the next day.  Elevated post-prandial was due to cheerios, pumpkin flax granola and blueberries. He is now measuring cheerios, blueberries and granola to keep blood sugar at or near goal. Is filling in nuts as a snack when hungry. He tries to choose his foods wisely at the grocery store to avoid over-processed items. He is focusing on fresh vegetables and fruit.  Plan: Continue  current medications; Continue checking blood sugar daily.   Hypothyroidism   Lab Results  Component Value Date/Time   TSH 3.64 09/02/2019 09:03 AM   TSH 3.41 05/07/2019 11:46 AM   FREET4 1.04 05/07/2019 11:46 AM   FREET4 0.89 01/01/2019 10:57 AM   Patient has failed these meds in past: none Patient is currently controlled on the following medications:  . Levothyroxine 25 mcg - 1 tablet daily before breakfast  We discussed:  Started 12/2018, noticed improvement in energy  Plan: Continue current medications  Depression   Patient has failed these meds in past: none  Patient is currently controlled on the following medications:   Sertraline 50 mg - 1 tablet daily  We discussed: patient reports mood is well controlled, avoids missed doses; would like to come off of it but has noticed when he tries to reduce it, irritability worsens  Plan: Continue current medications   Dysphagia   Patient has failed these meds in past: none  Patient is currently controlled on the following medications:   Omeprazole 40 mg - 1 capsule daily  Famotidine 20 mg - 1 tablet daily at bedtime   We discussed: reports doing much better since adding famotidine, denies any reoccurrence of nausea  Plan: Continue current medications   IDA   Iron/TIBC/Ferritin/ %Sat    Component Value Date/Time   IRON 86 10/13/2019 1001   IRON 76 10/13/2019 1001   TIBC 378 10/13/2019 1001   FERRITIN 38.4 10/13/2019 1001   IRONPCTSAT 21.3 10/13/2019 1001   IRONPCTSAT 20 10/13/2019 1001   CBC Latest Ref Rng & Units 09/02/2019 05/07/2019 01/01/2019  WBC 4.0 - 10.5 K/uL 4.2 4.7 4.5  Hemoglobin 13.0 -  17.0 g/dL 13.4 13.3 12.9(L)  Hematocrit 39 - 52 % 40.8 39.4 37.6(L)  Platelets 150 - 400 K/uL 207.0 208.0 188.0   Patient has failed these meds in past: none Patient is currently controlled on the following medications:   Ferrous sulfate 325 mg - 1 tablet every other day  We discussed: confirms adherence, denies  constipation, ferritin and CBC WNL  Plan: Continue current medications   BPH   Patient has failed these meds in past: tamsulosin (per chart, ineffective) Patient is currently controlled on the following medications:   (Proscar) Finasteride 5 mg - 1 tablet daily  We discussed: reports main concern is dribbling/weak stream, worsened in mornings after coffee and HCTZ, denies interest in medication changes, believes its age related  Plan: Continue current medications   Medication Management   Med changes updated 02/23/20 per GI --> Patient will hold amlodipine for 3 months then repeat LFTs. Start carvedilol 3.125 BID per Dr. Danise Mina to replace amlodipine. Resume atorvastatin 10 mg - 1/2 tablet at bedtime.   Update 03/20/2020 - Patient has not started Carvedilol and Dr. Darnell Level approved this due to risk of bradycardia. Continue monitor blood pressure and blood sugar at home. Keep up the good work with healthy diet and exercise.   OTCs: Multivitamin  Current pharmacy: [facility] mail order for maintenance medications, acute meds from [facility] Adherence: no medications with > 5 day gap between fills, uses pillbox  Affordability: no concerns, most medications are $7-8 for 90 day supply  We discussed: Patient asked about help with organizing medications. Offered [facility] services for adherence packaging and medication coordination. Compared prices for patient. He would like to switch to [facility] for synchronization and pill packaging. Reviewed timing of all of his medications as well as his supply on hand to develop sync plan.   Verbal consent obtained for [facility] enhanced pharmacy services (medication synchronization, adherence packaging, delivery coordination). A medication sync plan was created to allow patient to get all medications delivered once every 30 to 90 days per patient preference. Patient understands they have freedom to choose pharmacy and clinical pharmacist will coordinate care  between all prescribers and [facility].  Plan: Begin utilizing [facility] for pharmacy services  CCM Follow Up:  4 months - review lab results and blood pressure/blood sugar results  Sherre Poot, PharmD, [facility] Clinical Pharmacist [facility] [phone] (office) [phone] (mobile)

## 2020-03-16 ENCOUNTER — Telehealth: Payer: Self-pay

## 2020-03-16 MED ORDER — OMEPRAZOLE 40 MG PO CPDR: 40.0000 mg | DELAYED_RELEASE_CAPSULE | Freq: Every day | ORAL | 2 refills | Status: DC

## 2020-03-16 NOTE — Telephone Encounter (Signed)
E-scribed refill 

## 2020-03-16 NOTE — Chronic Care Management (AMB) (Signed)
Justin Benson is utilizing [facility] for delivery.   He needs a refill for: Omeprazole  Please submit refills to [place].   Thank you, [name]

## 2020-03-16 NOTE — Addendum Note (Signed)
Addended by: Brenton Grills on: 0/90/5025 61:54 PM   Modules accepted: Orders

## 2020-03-20 ENCOUNTER — Other Ambulatory Visit: Payer: Self-pay

## 2020-03-20 ENCOUNTER — Ambulatory Visit: Payer: Medicare Other

## 2020-03-20 DIAGNOSIS — E1169 Type 2 diabetes mellitus with other specified complication: Secondary | ICD-10-CM

## 2020-03-20 DIAGNOSIS — I10 Essential (primary) hypertension: Secondary | ICD-10-CM

## 2020-03-20 NOTE — Progress Notes (Signed)
I have collaborated with the care management provider regarding care management and care coordination activities outlined in this encounter and have reviewed this encounter including documentation in the note and care plan. I am certifying that I agree with the content of this note and encounter as supervising physician.  

## 2020-03-20 NOTE — Patient Instructions (Addendum)
Visit Information  Goals Addressed            This Visit's Progress   . Pharmacy Care Plan       CARE PLAN ENTRY  Current Barriers:  . Chronic Disease Management support, education, and care coordination needs related to Hypertension and Diabetes   Hypertension BP Readings from Last 3 Encounters:  01/18/20 132/60  01/06/20 (!) 142/66  11/18/19 (!) 169/70 .  Home blood pressure monitoring: weekly basis, 120-130s/60s . Pharmacist Clinical Goal(s): o Over the next 30 days, patient will work with PharmD and providers to maintain BP goal <140/90 mmHg . Current regimen:   Quinapril 20 mg/HCTZ 25 mg - 1 tablet daily  Quinapril 20 mg - 1 tablet daily . Interventions: o Reviewed home blood pressure monitoring. Blood pressures consistently 130-140s/50-60s Pulse 50-60s. Marland Kitchen  o Discussed amlodipine discontinuation and patient's choice to defer carvedilol.  . Patient self care activities - Over the next 30 days, patient will: o Continue current medications as prescribed   Diabetes Lab Results  Component Value Date/Time   HGBA1C 7.4 (H) 09/02/2019 09:03 AM   HGBA1C 7.3 (H) 05/07/2019 11:46 AM .  Pharmacist Clinical Goal(s): o Over the next 30 days, patient will work with PharmD and providers to achieve A1c goal <7% . Current regimen:  o Metformin 500 mg XR - 2 tablets twice daily with meals  . Interventions: o Reviewed blood sugar readings. Fasting readings range: 94-137. Postprandial reading range: 115-219. The majority of patient's readings are at or below goal. Patient identified things that contributed to above goal readings.  . Patient self care activities - Over the next 30 days, patient will: o Continue to check blood sugar daily. o Contact provider with any episodes of hypoglycemia (blood glucose < 70)  o Schedule comprehensive eye exam   Please see past updates related to this goal by clicking on the "Past Updates" button in the selected goal        The patient  verbalized understanding of instructions provided today and declined a print copy of patient instruction materials.   Telephone follow up appointment with pharmacy team member scheduled for: 06/2020  Sherre Poot, PharmD, [facility] Clinical Pharmacist [facility] [phone] (office) [phone] (mobile)  DASH Eating Plan DASH stands for "Dietary Approaches to Stop Hypertension." The DASH eating plan is a healthy eating plan that has been shown to reduce high blood pressure (hypertension). It may also reduce your risk for type 2 diabetes, heart disease, and stroke. The DASH eating plan may also help with weight loss. What are tips for following this plan?  General guidelines  Avoid eating more than 2,300 mg (milligrams) of salt (sodium) a day. If you have hypertension, you may need to reduce your sodium intake to 1,500 mg a day.  Limit alcohol intake to no more than 1 drink a day for nonpregnant women and 2 drinks a day for men. One drink equals 12 oz of beer, 5 oz of wine, or 1 oz of hard liquor.  Work with your health care provider to maintain a healthy body weight or to lose weight. Ask what an ideal weight is for you.  Get at least 30 minutes of exercise that causes your heart to beat faster (aerobic exercise) most days of the week. Activities may include walking, swimming, or biking.  Work with your health care provider or diet and nutrition specialist (dietitian) to adjust your eating plan to your individual calorie needs. Reading food labels  Check food labels for the amount of sodium per serving. Choose foods with less than 5 percent of the Daily Value of sodium. Generally, foods with less than 300 mg of sodium per serving fit into this eating plan.  To find whole grains, look for the word "whole" as the first word in the ingredient list. Shopping  Buy products labeled as "low-sodium" or "no salt added."  Buy fresh foods. Avoid canned foods and premade or  frozen meals. Cooking  Avoid adding salt when cooking. Use salt-free seasonings or herbs instead of table salt or sea salt. Check with your health care provider or pharmacist before using salt substitutes.  Do not fry foods. Cook foods using healthy methods such as baking, boiling, grilling, and broiling instead.  Cook with heart-healthy oils, such as olive, canola, soybean, or sunflower oil. Meal planning  Eat a balanced diet that includes: ? 5 or more servings of fruits and vegetables each day. At each meal, try to fill half of your plate with fruits and vegetables. ? Up to 6-8 servings of whole grains each day. ? Less than 6 oz of lean meat, poultry, or fish each day. A 3-oz serving of meat is about the same size as a deck of cards. One egg equals 1 oz. ? 2 servings of low-fat dairy each day. ? A serving of nuts, seeds, or beans 5 times each week. ? Heart-healthy fats. Healthy fats called Omega-3 fatty acids are found in foods such as flaxseeds and coldwater fish, like sardines, salmon, and mackerel.  Limit how much you eat of the following: ? Canned or prepackaged foods. ? Food that is high in trans fat, such as fried foods. ? Food that is high in saturated fat, such as fatty meat. ? Sweets, desserts, sugary drinks, and other foods with added sugar. ? Full-fat dairy products.  Do not salt foods before eating.  Try to eat at least 2 vegetarian meals each week.  Eat more home-cooked food and less restaurant, buffet, and fast food.  When eating at a restaurant, ask that your food be prepared with less salt or no salt, if possible. What foods are recommended? The items listed may not be a complete list. Talk with your dietitian about what dietary choices are best for you. Grains Whole-grain or whole-wheat bread. Whole-grain or whole-wheat pasta. Katoria Yetman rice. Modena Morrow. Bulgur. Whole-grain and low-sodium cereals. Pita bread. Low-fat, low-sodium crackers. Whole-wheat flour  tortillas. Vegetables Fresh or frozen vegetables (raw, steamed, roasted, or grilled). Low-sodium or reduced-sodium tomato and vegetable juice. Low-sodium or reduced-sodium tomato sauce and tomato paste. Low-sodium or reduced-sodium canned vegetables. Fruits All fresh, dried, or frozen fruit. Canned fruit in natural juice (without added sugar). Meat and other protein foods Skinless chicken or [place]. Ground chicken or [place]. Pork with fat trimmed off. Fish and seafood. Egg whites. Dried beans, peas, or lentils. Unsalted nuts, nut butters, and seeds. Unsalted canned beans. Lean cuts of beef with fat trimmed off. Low-sodium, lean deli meat. Dairy Low-fat (1%) or fat-free (skim) milk. Fat-free, low-fat, or reduced-fat cheeses. Nonfat, low-sodium ricotta or cottage cheese. Low-fat or nonfat yogurt. Low-fat, low-sodium cheese. Fats and oils Soft margarine without trans fats. Vegetable oil. Low-fat, reduced-fat, or light mayonnaise and salad dressings (reduced-sodium). Canola, safflower, olive, soybean, and sunflower oils. Avocado. Seasoning and other foods Herbs. Spices. Seasoning mixes without salt. Unsalted popcorn and pretzels. Fat-free sweets. What foods are not recommended? The items listed may not be a complete list. Talk with your dietitian about  what dietary choices are best for you. Grains Baked goods made with fat, such as croissants, muffins, or some breads. Dry pasta or rice meal packs. Vegetables Creamed or fried vegetables. Vegetables in a cheese sauce. Regular canned vegetables (not low-sodium or reduced-sodium). Regular canned tomato sauce and paste (not low-sodium or reduced-sodium). Regular tomato and vegetable juice (not low-sodium or reduced-sodium). Angie Fava. Olives. Fruits Canned fruit in a light or heavy syrup. Fried fruit. Fruit in cream or butter sauce. Meat and other protein foods Fatty cuts of meat. Ribs. Fried meat. Berniece Salines. Sausage. Bologna and other processed lunch meats.  Salami. Fatback. Hotdogs. Bratwurst. Salted nuts and seeds. Canned beans with added salt. Canned or smoked fish. Whole eggs or egg yolks. Chicken or [place] with skin. Dairy Whole or 2% milk, cream, and half-and-half. Whole or full-fat cream cheese. Whole-fat or sweetened yogurt. Full-fat cheese. Nondairy creamers. Whipped toppings. Processed cheese and cheese spreads. Fats and oils Butter. Stick margarine. Lard. Shortening. Ghee. Bacon fat. Tropical oils, such as coconut, palm kernel, or palm oil. Seasoning and other foods Salted popcorn and pretzels. Onion salt, garlic salt, seasoned salt, table salt, and sea salt. Worcestershire sauce. Tartar sauce. Barbecue sauce. Teriyaki sauce. Soy sauce, including reduced-sodium. Steak sauce. Canned and packaged gravies. Fish sauce. Oyster sauce. Cocktail sauce. Horseradish that you find on the shelf. Ketchup. Mustard. Meat flavorings and tenderizers. Bouillon cubes. Hot sauce and Tabasco sauce. Premade or packaged marinades. Premade or packaged taco seasonings. Relishes. Regular salad dressings. Where to find more information:  National Heart, Lung, and [place]: https://wilson-eaton.com/  American Heart Association: www.heart.org Summary  The DASH eating plan is a healthy eating plan that has been shown to reduce high blood pressure (hypertension). It may also reduce your risk for type 2 diabetes, heart disease, and stroke.  With the DASH eating plan, you should limit salt (sodium) intake to 2,300 mg a day. If you have hypertension, you may need to reduce your sodium intake to 1,500 mg a day.  When on the DASH eating plan, aim to eat more fresh fruits and vegetables, whole grains, lean proteins, low-fat dairy, and heart-healthy fats.  Work with your health care provider or diet and nutrition specialist (dietitian) to adjust your eating plan to your individual calorie needs. This information is not intended to replace advice given to you by your health  care provider. Make sure you discuss any questions you have with your health care provider. Document Revised: 06/27/2017 Document Reviewed: 07/08/2016 Elsevier Patient Education  2020 [employer].

## 2020-03-21 LAB — HM DIABETES EYE EXAM

## 2020-03-22 ENCOUNTER — Encounter: Payer: Self-pay | Admitting: Gastroenterology

## 2020-03-22 ENCOUNTER — Ambulatory Visit (INDEPENDENT_AMBULATORY_CARE_PROVIDER_SITE_OTHER): Payer: Medicare Other | Admitting: Gastroenterology

## 2020-03-22 VITALS — BP 150/60 | HR 65 | Ht 69.0 in | Wt 127.0 lb

## 2020-03-22 DIAGNOSIS — R748 Abnormal levels of other serum enzymes: Secondary | ICD-10-CM

## 2020-03-22 NOTE — Patient Instructions (Signed)
If you are age 83 or older, your body mass index should be between 23-30. Your Body mass index is 18.75 kg/m. If this is out of the aforementioned range listed, please consider follow up with your Primary Care Provider.  If you are age 46 or younger, your body mass index should be between 19-25. Your Body mass index is 18.75 kg/m. If this is out of the aformentioned range listed, please consider follow up with your Primary Care Provider.   Your provider has requested that you go to the basement level for lab work in 2 months (October 2021). Press "B" on the elevator. The lab is located at the first door on the left as you exit the elevator.  Due to recent changes in healthcare laws, you may see the results of your imaging and laboratory studies on MyChart before your provider has had a chance to review them.  We understand that in some cases there may be results that are confusing or concerning to you. Not all laboratory results come back in the same time frame and the provider may be waiting for multiple results in order to interpret others.  Please give [place] 48 hours in order for your provider to thoroughly review all the results before contacting the office for clarification of your results.   Thank you for entrusting me with your care and choosing [facility].  Dr [name]

## 2020-03-22 NOTE — Progress Notes (Signed)
Review of pertinent gastrointestinal problems: 1.IDA;4/30/2019labs show very good improvement after iron supplementsCBC with a hemoglobin of 12.4 (9.4 08/28/2017), MCV normal (78.8 08/28/2017), ferritin normal at 28 (10.73 months ago). 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 pylori negative.  Iron supplement once daily resolved his iron deficiency anemia.  Hemoglobin 10 April 2018 2. History of adenomatous poylp:02/28/2014 colonoscopy, history of subcentimeter TA removed 2010 by Dr. Sheran Luz of one small polyp in several diverticulum in the left colon otherwise normal. Pathology showedbenign polyp. No recall colonoscopy was recommended(age).01/2018 Colonoscopy single 4m TA 3.  Chronically elevated liver tests since at least 2014. (May 2014 alkaline phosphatase 325, AST 124, ALT 09/05/2016).  November 2021 alk phos 135, AST 163, ALT 156.  CT scan abdomen pelvis with IV and oral contrast showed no acute findings liver appeared normal without obvious cirrhosis changes.  Abdominal ultrasound October 2020 showed normal liver, surgically absent gallbladder  Biochemical work-up March 2021 anti-smooth muscle antibody negative, hepatitis A, B, C serologies all negative.  Ceruloplasmin normal, alpha-1 antitrypsin level normal, ANA negative, iron studies normal, celiac sprue testing negative.  2014 AMA negative  Held statin medicine without improvement in liver tests   HPI: This is a very pleasant 85year old man whom I last saw 5 months ago  Since I last saw him March 2021 I asked that he stop his statin and we repeated his liver tests last month and they had not improved at all.  I then asked him to stop his amlodipine for 3 months.  He has been off the amlodipine now for 1 month.  She overall feels quite well.  His bowels are fine his throat feels well with less throat clearing since he  started taking a Pepcid at bedtime every night.  He has been checking his blood pressures at home and he showed me the numbers, looks like a couple readings are in the 130s to low 140s but most are in the 14283systolically.  His weight is overall stable.  Today he tells me that he is a recovering alcoholic.  He has been almost completely sober since January 2020.  He did relapse in May 2021 for about 2 weeks.   ROS: complete GI ROS as described in HPI, all other review negative.  Constitutional:  No unintentional weight loss   Past Medical History:  Diagnosis Date  . AAA (abdominal aortic aneurysm) ([place] 05/2012   mild 2.3 cm , rpt 1 year  . Alcoholic hepatitis without ascites 06/02/2014   Normal viral hep panel, normal abd [place](2015)  . Anemia   . Anxiety   . Arthritis    fingers and hip  . BCC (basal cell carcinoma of skin) 11/2018   L leg ([place])  . BPH (benign prostatic hypertrophy)   . Choledocholithiasis 11/2012   s/p ERCP, s/p lap chole  . COPD (chronic obstructive pulmonary disease) ([place]   . Depression   . Diabetes mellitus 4/02  . GERD (gastroesophageal reflux disease)   . Hyperlipidemia   . Hypertension 1996  . Pneumonia as a baby  . 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 Schwannoma of spinal cord ([place] 11/2012, 09/2015   medial to R psoas muscle, 4cm size  . Squamous cell carcinoma of multiple sites 09/2015   finger, L temple, R forearm - [place] [facility]  . Substance abuse ([place]    I probably drink too much, dr, told me to  cut down    Past Surgical History:  Procedure Laterality Date  . BASAL CELL CARCINOMA EXCISION Right 05/2017   right ear; Dr. Elvera Lennox, [facility]  . CHOLECYSTECTOMY N/A 03/08/2013   Procedure: LAPAROSCOPIC CHOLECYSTECTOMY WITH INTRAOPERATIVE CHOLANGIOGRAM;  Surgeon: Harl Bowie, MD;  Location: [place];  Service: General;  Laterality: N/A;  . CIRCUMCISION  10/30/09   Dr Gaynelle Arabian  . COLONOSCOPY  11/2008   2 small polyps (1 tubular),  diverticulosis, lipoma, int/ext hemorrhoids [name])  . COLONOSCOPY  02/2014   1 polyp, diverticulosis, no rpt needed [name])  . COLONOSCOPY  01/2018   1 TA [name])  . ENDOSCOPIC RETROGRADE CHOLANGIOPANCREATOGRAPHY (ERCP) WITH PROPOFOL N/A 12/24/2012   Procedure: ENDOSCOPIC RETROGRADE CHOLANGIOPANCREATOGRAPHY (ERCP) WITH PROPOFOL;  Surgeon: Milus Banister, MD;  Location: [facility];  Service: Endoscopy;  Laterality: N/A;  . ESOPHAGOGASTRODUODENOSCOPY  01/2018   friable duodenal mucosa, mild gastritis s/p benign biopsies [name])  . LAPAROSCOPIC CHOLECYSTECTOMY  02/2013   Dr. Ninfa Linden    Current Outpatient Medications  Medication Sig Dispense Refill  . amLODipine (NORVASC) 10 MG tablet TAKE 1 TABLET BY MOUTH  DAILY (Patient not taking: Reported on 02/28/2020) 90 tablet 3  . atorvastatin (LIPITOR) 10 MG tablet Take 0.5 tablets (5 mg total) by mouth daily. 45 tablet 3  . famotidine (PEPCID) 20 MG tablet TAKE 1 TABLET BY MOUTH EVERYDAY AT BEDTIME 30 tablet 11  . ferrous sulfate 325 (65 FE) MG tablet Take 1 tablet (325 mg total) by mouth every other day.  3  . finasteride (PROSCAR) 5 MG tablet TAKE 1 TABLET BY MOUTH IN  THE EVENING 90 tablet 3  . levothyroxine (SYNTHROID) 25 MCG tablet TAKE 1 TABLET BY MOUTH  DAILY BEFORE BREAKFAST 90 tablet 2  . metFORMIN (GLUCOPHAGE-XR) 500 MG 24 hr tablet TAKE 2 TABLETS BY MOUTH  TWICE DAILY 360 tablet 1  . Multiple Vitamin (MULTIVITAMIN WITH MINERALS) TABS Take 1 tablet by mouth daily.    Marland Kitchen omeprazole (PRILOSEC) 40 MG capsule Take 1 capsule (40 mg total) by mouth daily. 90 capsule 2  . polyethylene glycol powder (GLYCOLAX/MIRALAX) 17 GM/SCOOP powder Take 8.5 g by mouth daily as needed for moderate constipation. Hold for diarrhea 3350 g 1  . quinapril (ACCUPRIL) 20 MG tablet TAKE 1 TABLET BY MOUTH AT  BEDTIME 90 tablet 3  . quinapril-hydrochlorothiazide (ACCURETIC) 20-25 MG tablet TAKE 1 TABLET BY MOUTH  DAILY 90 tablet 3  . sertraline (ZOLOFT) 50 MG tablet  TAKE 1 TABLET BY MOUTH  DAILY 90 tablet 3   No current facility-administered medications for this visit.    Allergies as of 03/22/2020  . (No Known Allergies)    Family History  Problem Relation Age of Onset  . Prostate cancer Father   . Aneurysm Father        aortic  . Stroke Father   . Cancer Sister        bladder (nonsmoker)  . Hodgkin's lymphoma Brother 31  . Prostate cancer Brother 81       1/2  . Prostate cancer Brother 65       1/2  . Other Sister        cerebral hemm  . Lupus Sister   . Diabetes Brother   . Colon cancer Neg Hx     Social History   Socioeconomic History  . Marital status: Single    Spouse name: Not on file  . Number of children: 1  . Years of education: Not on file  .  Highest education level: Not on file  Occupational History  . Occupation: Chartered certified accountant: retired    Comment: retired 1999  Tobacco Use  . Smoking status: Former Smoker    Packs/day: 2.00    Years: 30.00    Pack years: 60.00    Types: Cigarettes    Start date: 07/29/1962    Quit date: 07/29/1992    Years since quitting: 27.6  . Smokeless tobacco: Never Used  Vaping Use  . Vaping Use: Never used  Substance and Sexual Activity  . Alcohol use: Yes    Alcohol/week: 3.0 standard drinks    Types: 3 Cans of beer per week    Comment: 3 cans of beer daily  . Drug use: No  . Sexual activity: Yes  Other Topics Concern  . 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   Social Determinants of Health   Financial Resource Strain: Low Risk   . Difficulty of Paying Living Expenses: Not hard at all  Food Insecurity:   . Worried About Paediatric nurse in the Last Year: Not on file  . Ran Out of Food in the Last Year: Not on file  Transportation Needs:   . Lack of Transportation (Medical): Not on file  . Lack of Transportation (Non-Medical): Not on file  Physical Activity:   . Days of Exercise per Week: Not on file  . Minutes of Exercise per Session: Not on file  Stress:   . Feeling of Stress : Not on file  Social Connections:   . Frequency of Communication with Friends and Family: Not on file  . Frequency of Social Gatherings with Friends and Family: Not on file  . Attends Religious Services: Not on file  . Active Member of Clubs or Organizations: Not on file  . Attends Archivist Meetings: Not on file  . Marital Status: Not on file  Intimate Partner Violence:   . Fear of Current or Ex-Partner: Not on file  . Emotionally Abused: Not on file  . Physically Abused: Not on file  . Sexually Abused: Not on file     Physical Exam: Ht 5' 9"  (1.753 m)   Wt 127 lb (57.6 kg)   BMI 18.75 kg/m  Constitutional: generally well-appearing Psychiatric: alert and oriented x3 Abdomen: soft, nontender, nondistended, no obvious ascites, no peritoneal signs, normal bowel sounds No peripheral edema noted in lower extremities  Assessment and plan: 85 y.o. male with elevated liver tests  Recovering alcoholic however I am not sure if alcoholic liver disease explains his chronically elevated transaminases and alk phos.  Stopping statin did not help, he is 1 month then to a holding of his amlodipine.  We will recheck his LFTs in 2 months and at the same time repeat in antimitochondrial antibody, the last time that was checked was 2014 and it was negative.  I will contact you when I see those results and we will go from there.  Please see the "Patient Instructions" section for addition details about the plan.  Owens Loffler, MD [facility] 03/22/2020, 8:40 AM   Total time on date of encounter was  25 minutes  (this included time spent preparing to see the patient reviewing records; obtaining and/or reviewing separately obtained history; performing a medically appropriate exam and/or evaluation; counseling and educating the patient and family if present; ordering medications, tests or procedures if applicable; and documenting clinical information in the health record).

## 2020-04-07 ENCOUNTER — Encounter: Payer: Self-pay | Admitting: Family Medicine

## 2020-04-25 ENCOUNTER — Telehealth: Payer: Self-pay

## 2020-04-25 MED ORDER — METFORMIN HCL ER 500 MG PO TB24: 1000.0000 mg | ORAL_TABLET | Freq: Two times a day (BID) | ORAL | 1 refills | Status: DC

## 2020-04-25 MED ORDER — SERTRALINE HCL 50 MG PO TABS: 50.0000 mg | ORAL_TABLET | Freq: Every day | ORAL | 1 refills | Status: DC

## 2020-04-25 NOTE — Telephone Encounter (Addendum)
E-scribed refills to [facility].  ----- Message from Burnice Logan, [facility] sent at 04/25/2020  8:27 AM EDT ----- Regarding: Refill Request Hi Lattie Haw!  Hope all is well today.   Mr. Stapel needs refills for the following medications sent to [facility] for delivery:  Metformin xr  Sertraline   Thank you! [name]

## 2020-05-05 ENCOUNTER — Other Ambulatory Visit (INDEPENDENT_AMBULATORY_CARE_PROVIDER_SITE_OTHER): Payer: Medicare Other

## 2020-05-05 ENCOUNTER — Other Ambulatory Visit: Payer: Self-pay

## 2020-05-05 DIAGNOSIS — R748 Abnormal levels of other serum enzymes: Secondary | ICD-10-CM | POA: Diagnosis not present

## 2020-05-05 LAB — HEPATIC FUNCTION PANEL
ALT: 46 U/L (ref 0–53)
AST: 56 U/L — ABNORMAL HIGH (ref 0–37)
Albumin: 4.6 g/dL (ref 3.5–5.2)
Alkaline Phosphatase: 105 U/L (ref 39–117)
Bilirubin, Direct: 0.2 mg/dL (ref 0.0–0.3)
Total Bilirubin: 0.6 mg/dL (ref 0.2–1.2)
Total Protein: 7.3 g/dL (ref 6.0–8.3)

## 2020-05-09 LAB — MITOCHONDRIAL ANTIBODIES: Mitochondrial M2 Ab, IgG: <=20 U

## 2020-05-10 ENCOUNTER — Encounter: Payer: Self-pay | Admitting: Family Medicine

## 2020-05-10 ENCOUNTER — Ambulatory Visit (INDEPENDENT_AMBULATORY_CARE_PROVIDER_SITE_OTHER): Payer: Medicare Other | Admitting: Family Medicine

## 2020-05-10 ENCOUNTER — Other Ambulatory Visit: Payer: Self-pay

## 2020-05-10 VITALS — BP 150/66 | HR 66 | Temp 97.8°F | Ht 69.0 in | Wt 128.2 lb

## 2020-05-10 DIAGNOSIS — I10 Essential (primary) hypertension: Secondary | ICD-10-CM

## 2020-05-10 DIAGNOSIS — E1169 Type 2 diabetes mellitus with other specified complication: Secondary | ICD-10-CM | POA: Diagnosis not present

## 2020-05-10 DIAGNOSIS — R7401 Elevation of levels of liver transaminase levels: Secondary | ICD-10-CM | POA: Diagnosis not present

## 2020-05-10 DIAGNOSIS — Z23 Encounter for immunization: Secondary | ICD-10-CM

## 2020-05-10 DIAGNOSIS — R490 Dysphonia: Secondary | ICD-10-CM | POA: Insufficient documentation

## 2020-05-10 LAB — POCT GLYCOSYLATED HEMOGLOBIN (HGB A1C): Hemoglobin A1C: 6.3 % — AB (ref 4.0–5.6)

## 2020-05-10 NOTE — Assessment & Plan Note (Signed)
Significant improvement on nightly pepcid - anticipate LPR related.

## 2020-05-10 NOTE — Assessment & Plan Note (Addendum)
BP elevated but reports good readings at home.  No med changes made today.  I asked him to bring BP cuff to next appt to compare to our readings.

## 2020-05-10 NOTE — Assessment & Plan Note (Signed)
Appreciate GI care. Transaminitis improved off amlodipine. Will stay off this.

## 2020-05-10 NOTE — Assessment & Plan Note (Signed)
Chronic, improved. Continue metformin XR full dose. Foot exam today. UTD eye exam.

## 2020-05-10 NOTE — Progress Notes (Signed)
This visit was conducted in person.  BP (!) 150/66 (BP Location: Right Arm, Patient Position: Sitting, Cuff Size: Normal)   Pulse 66   Temp 97.8 F (36.6 C) (Temporal)   Ht 5\' 9"  (1.753 m)   Wt 128 lb 3 oz (58.1 kg)   SpO2 98%   BMI 18.93 kg/m   BP elevated on repeat.   CC: 4 mo DM f/u visit  Subjective:    Patient ID: Justin Benson, male    DOB: 05/08/1935, 85 y.o.   MRN: [id number]  HPI: Justin Benson is a 85 y.o. male presenting on 05/10/2020 for Diabetes (Here for f/u.)   Saw GI 02/2020, note reviewed. IDA - stable on iron supplements. Chronically elevated liver function since 2014 - statin hold didn't help, CT and US imaging unrevealing, liver disease lab workup negative. Holding amlodipine actually largely resolved transaminitis.   Taking nightly pepcid has helped hoarseness and throat clearing.   Intermittent shooting pains to bilateral occiput - can have multiple over several hours. Not too bothersome - hasn't tried medications for this.   HTN - home BP readings all <993 systolic. He continues quinapril/hctz 20/25 in am and quinapril 20mg  at night time. Mild unsteadiness.   DM - does regularly check sugars 131 this morning - after fruit cake last night. Compliant with antihyperglycemic regimen which includes: metformin XR 1000mg  bid. Denies low sugars or hypoglycemic symptoms. Denies paresthesias. Last diabetic eye exam 02/2020. Pneumovax: 2004. Prevnar: 2015. Glucometer brand: prodigy. DSME: completed remotely. Lab Results  Component Value Date   HGBA1C 6.3 (A) 05/10/2020   Diabetic Foot Exam - Simple   Simple Foot Form Diabetic Foot exam was performed with the following findings: Yes 05/10/2020 10:32 AM  Visual Inspection See comments: Yes Sensation Testing Intact to touch and monofilament testing bilaterally: Yes Pulse Check Posterior Tibialis and Dorsalis pulse intact bilaterally: Yes Comments Thickened great toenails, elongated otherwise    Lab Results    Component Value Date   MICROALBUR <0.7 06/02/2015       Relevant past medical, surgical, family and social history reviewed and updated as indicated. Interim medical history since our last visit reviewed. Allergies and medications reviewed and updated. Outpatient Medications Prior to Visit  Medication Sig Dispense Refill  . atorvastatin (LIPITOR) 10 MG tablet Take 0.5 tablets (5 mg total) by mouth daily. 45 tablet 3  . famotidine (PEPCID) 20 MG tablet TAKE 1 TABLET BY MOUTH EVERYDAY AT BEDTIME 30 tablet 11  . ferrous sulfate 325 (65 FE) MG tablet Take 1 tablet (325 mg total) by mouth every other day.  3  . finasteride (PROSCAR) 5 MG tablet TAKE 1 TABLET BY MOUTH IN  THE EVENING 90 tablet 3  . levothyroxine (SYNTHROID) 25 MCG tablet TAKE 1 TABLET BY MOUTH  DAILY BEFORE BREAKFAST 90 tablet 2  . metFORMIN (GLUCOPHAGE-XR) 500 MG 24 hr tablet Take 2 tablets (1,000 mg total) by mouth 2 (two) times daily. 360 tablet 1  . Multiple Vitamin (MULTIVITAMIN WITH MINERALS) TABS Take 1 tablet by mouth daily.    Marland Kitchen omeprazole (PRILOSEC) 40 MG capsule Take 1 capsule (40 mg total) by mouth daily. 90 capsule 2  . polyethylene glycol powder (GLYCOLAX/MIRALAX) 17 GM/SCOOP powder Take 8.5 g by mouth daily as needed for moderate constipation. Hold for diarrhea 3350 g 1  . quinapril (ACCUPRIL) 20 MG tablet TAKE 1 TABLET BY MOUTH AT  BEDTIME 90 tablet 3  . quinapril-hydrochlorothiazide (ACCURETIC) 20-25 MG tablet TAKE 1 TABLET BY  MOUTH  DAILY 90 tablet 3  . sertraline (ZOLOFT) 50 MG tablet Take 1 tablet (50 mg total) by mouth daily. 90 tablet 1  . amLODipine (NORVASC) 10 MG tablet TAKE 1 TABLET BY MOUTH  DAILY (Patient not taking: Reported on 05/10/2020) 90 tablet 3   No facility-administered medications prior to visit.     Per HPI unless specifically indicated in ROS section below Review of Systems Objective:  BP (!) 150/66 (BP Location: Right Arm, Patient Position: Sitting, Cuff Size: Normal)   Pulse 66    Temp 97.8 F (36.6 C) (Temporal)   Ht 5\' 9"  (1.753 m)   Wt 128 lb 3 oz (58.1 kg)   SpO2 98%   BMI 18.93 kg/m   Wt Readings from Last 3 Encounters:  05/10/20 128 lb 3 oz (58.1 kg)  03/22/20 127 lb (57.6 kg)  02/28/20 129 lb 3 oz (58.6 kg)      Physical Exam Vitals and nursing note reviewed.  Constitutional:      General: He is not in acute distress.    Appearance: Normal appearance. He is well-developed.  HENT:     Head: Normocephalic and atraumatic.  Eyes:     General: No scleral icterus.    Extraocular Movements: Extraocular movements intact.     Conjunctiva/sclera: Conjunctivae normal.     Pupils: Pupils are equal, round, and reactive to light.  Cardiovascular:     Rate and Rhythm: Normal rate and regular rhythm.     Pulses: Normal pulses.     Heart sounds: Normal heart sounds. No murmur heard.   Pulmonary:     Effort: Pulmonary effort is normal. No respiratory distress.     Breath sounds: Normal breath sounds. No wheezing, rhonchi or rales.     Comments: Faint crackles bibasilarly Musculoskeletal:     Cervical back: Normal range of motion and neck supple.     Comments: See HPI for foot exam if done  Lymphadenopathy:     Cervical: No cervical adenopathy.  Skin:    General: Skin is warm and dry.     Findings: No rash.  Neurological:     Mental Status: He is alert.       Results for orders placed or performed in visit on 05/10/20  POCT glycosylated hemoglobin (Hb A1C)  Result Value Ref Range   Hemoglobin A1C 6.3 (A) 4.0 - 5.6 %   HbA1c POC (<> result, manual entry)     HbA1c, POC (prediabetic range)     HbA1c, POC (controlled diabetic range)     Assessment & Plan:  This visit occurred during the SARS-CoV-2 public health emergency.  Safety protocols were in place, including screening questions prior to the visit, additional usage of staff PPE, and extensive cleaning of exam room while observing appropriate contact time as indicated for disinfecting solutions.    Problem List Items Addressed This Visit    Type 2 diabetes mellitus with other specified complication ([place]) - Primary    Chronic, improved. Continue metformin XR full dose. Foot exam today. UTD eye exam.       Relevant Orders   POCT glycosylated hemoglobin (Hb A1C) (Completed)   Transaminitis    Appreciate GI care. Transaminitis improved off amlodipine. Will stay off this.       Hoarseness    Significant improvement on nightly pepcid - anticipate LPR related.       Essential hypertension    BP elevated but reports good readings at home.  No med changes  made today.  I asked him to bring BP cuff to next appt to compare to our readings.        Other Visit Diagnoses    Need for influenza vaccination       Relevant Orders   Flu Vaccine QUAD High Dose(Fluad) (Completed)       No orders of the defined types were placed in this encounter.  Orders Placed This Encounter  Procedures  . Flu Vaccine QUAD High Dose(Fluad)  . POCT glycosylated hemoglobin (Hb A1C)    Follow up plan: Return in about 4 months (around 09/10/2020) for annual exam, prior fasting for blood work, medicare wellness visit.  [name], MD

## 2020-05-10 NOTE — Patient Instructions (Addendum)
Flu shot today  a1c was better controlled - continue current metformin XR dose.  Continue current medicines including pepcid at night.  Return in 4 months for physical.

## 2020-05-30 ENCOUNTER — Telehealth: Payer: Self-pay

## 2020-05-30 NOTE — Chronic Care Management (AMB) (Signed)
Justin Benson needs a refill on levothyroxine 25 mcg. Please send refill to [facility].  Thanks!  Debbora Dus, PharmD Clinical Pharmacist [facility] at [facility] [phone]

## 2020-05-30 NOTE — Chronic Care Management (AMB) (Addendum)
Chronic Care Management Pharmacy Assistant   Name: Justin Benson  MRN: [id number] DOB: [birth year]  Reason for Encounter: Medication Review/ Monthly Dispensing Call  Patient Questions:  1.  Have you seen any other providers since your last visit? Yes, 03/22/2020- Dr. [name] (GI), 05/10/2020- [name] (PCP)  2.  Any changes in your medicines or health? Yes, 04/30/2020- Amlodipine 10 mg discontinued.  PCP : [name], MD  Allergies:   Allergies  Allergen Reactions   Amlodipine Other (See Comments)    transaminitis    Medications: Outpatient Encounter Medications as of 05/30/2020  Medication Sig   atorvastatin (LIPITOR) 10 MG tablet Take 0.5 tablets (5 mg total) by mouth daily.   famotidine (PEPCID) 20 MG tablet TAKE 1 TABLET BY MOUTH EVERYDAY AT BEDTIME   ferrous sulfate 325 (65 FE) MG tablet Take 1 tablet (325 mg total) by mouth every other day.   finasteride (PROSCAR) 5 MG tablet TAKE 1 TABLET BY MOUTH IN  THE EVENING   levothyroxine (SYNTHROID) 25 MCG tablet TAKE 1 TABLET BY MOUTH  DAILY BEFORE BREAKFAST   metFORMIN (GLUCOPHAGE-XR) 500 MG 24 hr tablet Take 2 tablets (1,000 mg total) by mouth 2 (two) times daily.   Multiple Vitamin (MULTIVITAMIN WITH MINERALS) TABS Take 1 tablet by mouth daily.   omeprazole (PRILOSEC) 40 MG capsule Take 1 capsule (40 mg total) by mouth daily.   polyethylene glycol powder (GLYCOLAX/MIRALAX) 17 GM/SCOOP powder Take 8.5 g by mouth daily as needed for moderate constipation. Hold for diarrhea   quinapril (ACCUPRIL) 20 MG tablet TAKE 1 TABLET BY MOUTH AT  BEDTIME   quinapril-hydrochlorothiazide (ACCURETIC) 20-25 MG tablet TAKE 1 TABLET BY MOUTH  DAILY   sertraline (ZOLOFT) 50 MG tablet Take 1 tablet (50 mg total) by mouth daily.   No facility-administered encounter medications on file as of 05/30/2020.    Current Diagnosis: Patient Active Problem List   Diagnosis Date Noted   Hoarseness 05/10/2020   Constipation 02/28/2020   Carotid  stenosis 09/12/2019   Interstitial lung disease ([place]) 09/09/2019   Dysphagia 09/06/2019   Nausea 09/06/2019   Bilateral leg edema 02/09/2019   Iron deficiency anemia 08/30/2017   Borderline hypothyroidism 08/30/2017   Kidney cyst, acquired 05/26/2017   Chronic lower back pain 06/24/2016   Serous otitis media with rupture of tympanic membrane 03/28/2016   Abdominal aortic atherosclerosis ([place]) 06/05/2014   Advanced care planning/counseling discussion 92/42/6834   Alcoholic hepatitis without ascites 06/02/2014   Health maintenance examination 06/02/2014   Medicare annual wellness visit, subsequent 06/01/2013   Weight loss 01/11/2013   Transaminitis 12/01/2012   Right shoulder pain 11/30/2012   Schwannoma of spinal cord ([place]) 11/26/2012   DIVERTICULOSIS OF COLON 12/22/2008   Hyperlipidemia associated with type 2 diabetes mellitus ([place]) 01/10/2007   Alcohol use disorder, severe, in sustained remission ([place]) 01/10/2007   Ex-smoker 01/10/2007   MDD (major depressive disorder), recurrent episode, moderate ([place]) 01/10/2007   COPD (chronic obstructive pulmonary disease) ([place]) 01/10/2007   Benign prostatic hyperplasia 01/10/2007   Type 2 diabetes mellitus with other specified complication ([place]) 19/62/2297   Essential hypertension 01/09/2007   Reviewed chart for medication changes ahead of medication coordination call.  Recent OVs, Consults: 03/22/2020- Dr. [name] (GI), 05/10/2020- [name] (PCP) Medication changes indicated: Since August 2021 -->  Amlodipine 10 mg discontinued; Patient did not start carvedilol to replace amlodipine. PCP approved not starting carvedilol due to bradycardia with plan to monitor home BP.   BP Readings from Last 3  Encounters:  05/10/20 (!) 150/66  03/22/20 (!) 150/60  02/28/20 (!) 154/62    Home BP readings:  170/80 mmHg today  145/67 yesterday 117/57 Monday   Home BG: 131, 134, 140s   Lab Results  Component Value Date   HGBA1C 6.3 (A) 05/10/2020      Patient obtains medications through Adherence Packaging  30 Days   Last adherence delivery included:  Delivered 05/18/20 to sync meds to 11/3 Sertraline 50 mg- 1 tablet daily (breakfast) #10 Levothyroxine 25 mcg- 1 tablet daily (before breakfast) #12   Patient is due for next adherence delivery on: 06/02/20 Called patient and reviewed medications and coordinated delivery.  This delivery to include: Levothyroxine 25 mcg- 1 tablet daily (before breakfast) Famotidine 20 mg- 1 tablet daily (bedtime) Metformin ER 500 mg- 2 tablets twice daily (breakfast and evening meal) Omeprazole 40 mg- 1 tablet daily (breakfast) Sertraline 50 mg - 1 tablet daily (breakfast) Quinapril 20 mg - 1 tablet daily (evening meal) Quinapril/HCTZ 20/25 mg - 1 tablet daily (breakfast) Atorvastatin 10 mg - 1/2 tablet daily (evening meal)  Patient declined the following medications:  MiraLAX - uses pRN Patient needs refills for levothyroxine 25 mcg, quinapril/HCTZ Confirmed delivery date of 06/02/2020, advised patient that pharmacy will contact them the morning of delivery.  Follow-Up:  Next month for medication adherence review Coordination of Enhanced Pharmacy Services and Pharmacist Review  Reviewed by Debbora Dus - Patient asked about when he would resume amlodipine. Called to explain that the medication would not be resumed due to his liver enzymes improving off the medication. Patient has extra supply of multiple medications despite not refilling in past 90 days. Asked patient to discard expired medications and we will begin adherence packaging on 11/5 with all maintenance meds in order to simplify his routine. He agreed with plan. BP elevated today - asked patient to repeat check after resting 5 minutes and again this evening, call if BP remains above 150/90 mmHg or any symptoms of headache, chest pain, blurred vision, nausea, shortness of breath.   Pattricia Boss, [place] Clinical Pharmacist  Assistant [phone]  Debbora Dus, PharmD Clinical Pharmacist [facility] [facility] at [facility] [phone]

## 2020-05-31 ENCOUNTER — Telehealth: Payer: Self-pay

## 2020-05-31 IMAGING — DX DG SHOULDER 2+V*R*
3 series · 3 of 3 positions shown · non-contrast
Comparison: None.

CLINICAL DATA: Right shoulder pain after fall yesterday.

EXAM:
RIGHT SHOULDER - 2+ VIEW

[shoulder ap]
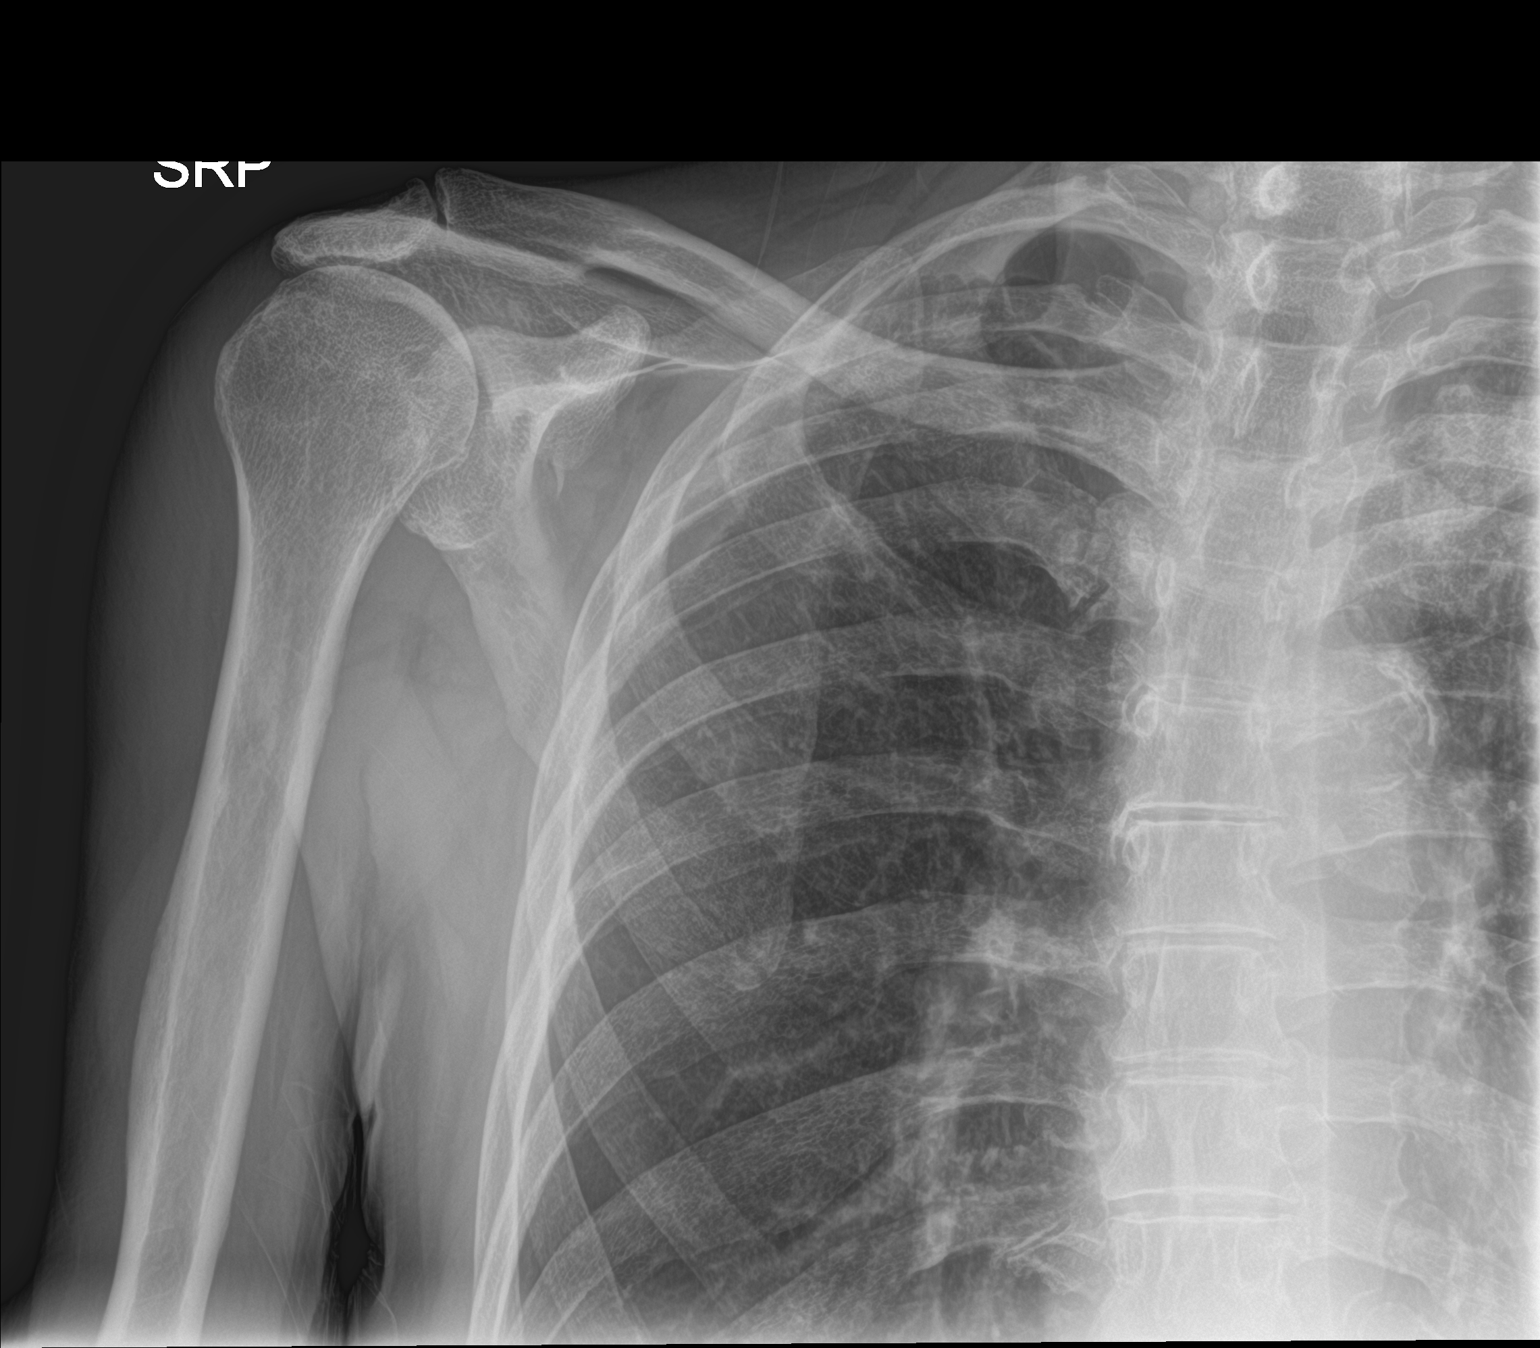

[shoulder grashey]
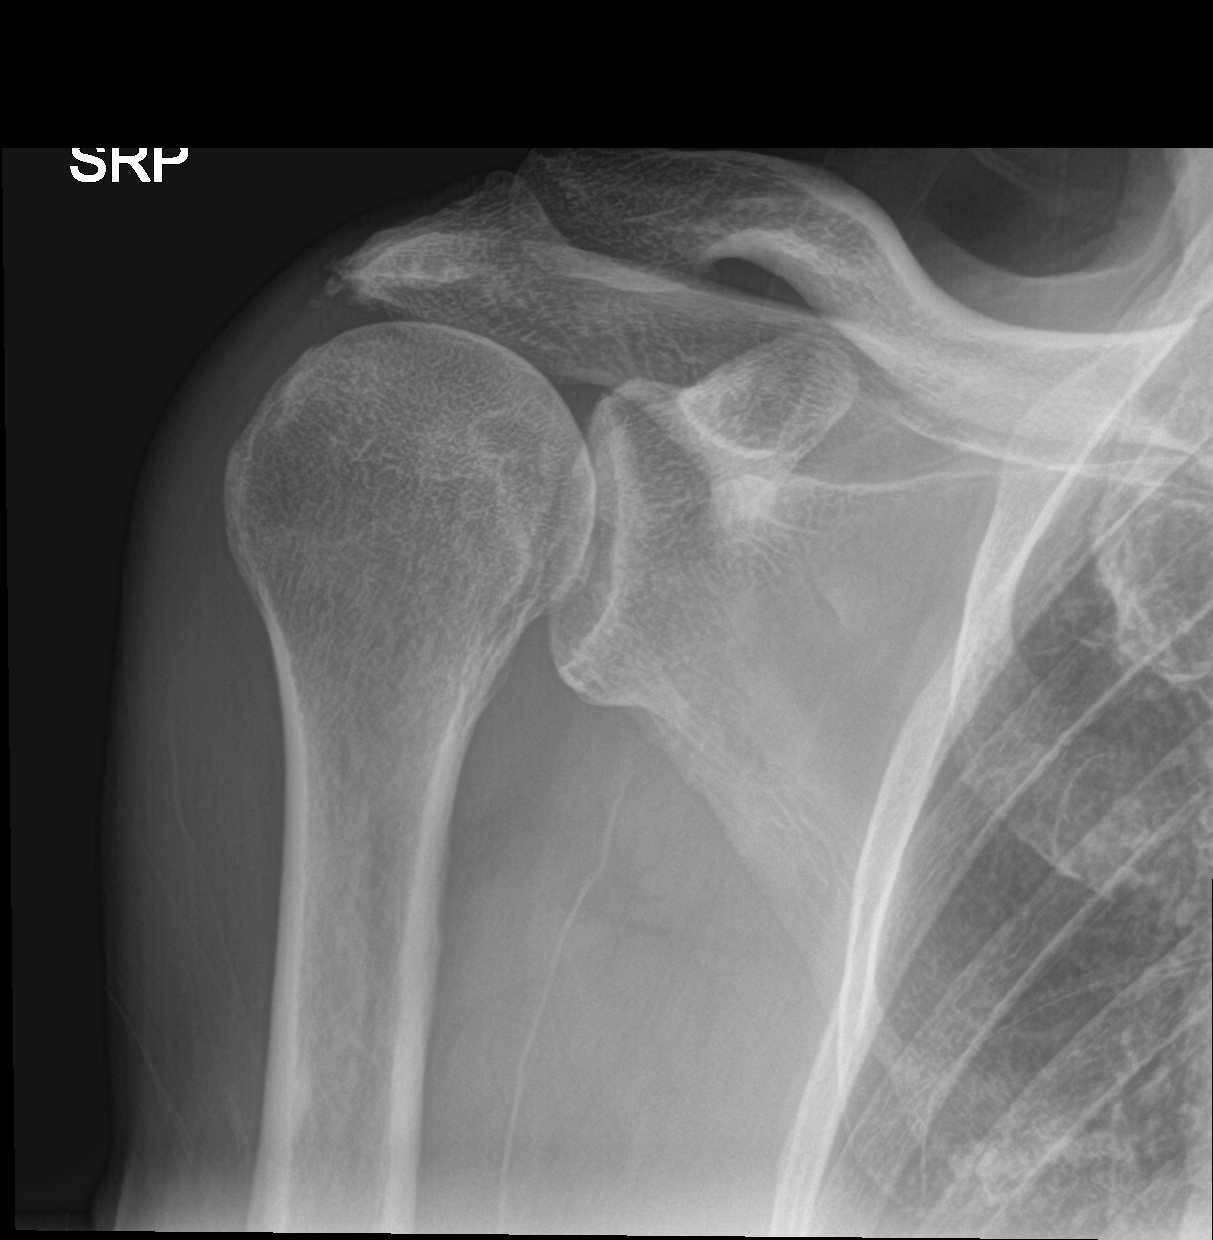

[shoulder y-view]
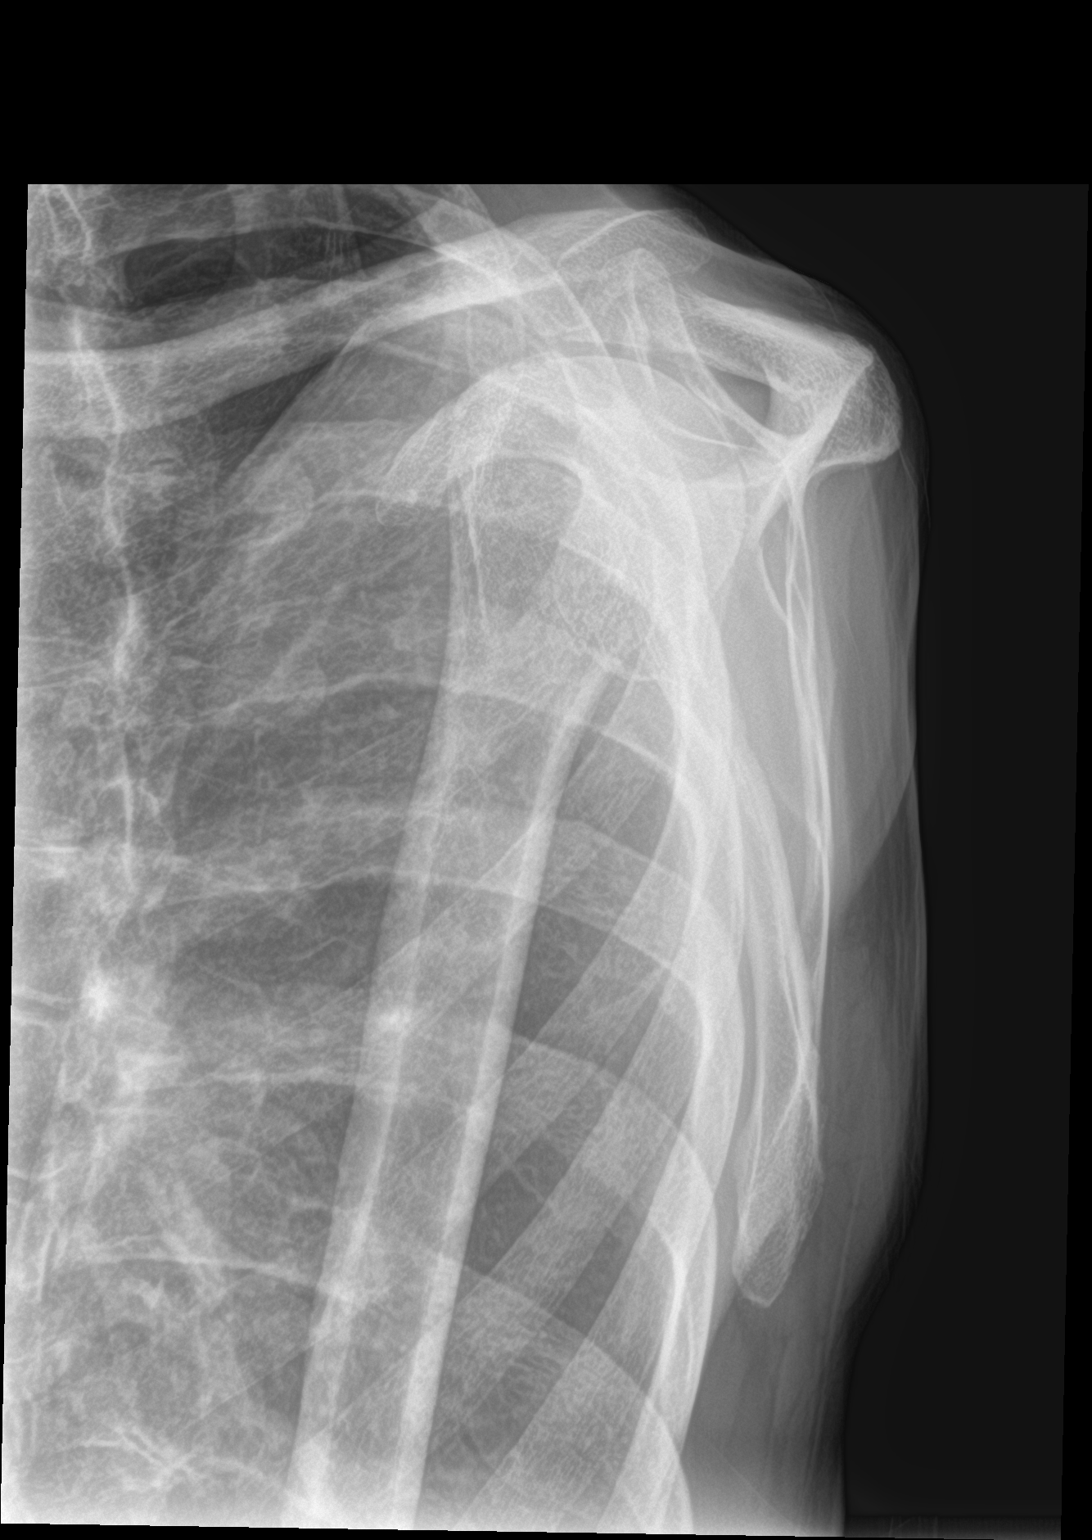

[3 of 3 positions shown; findings below may reference images not displayed]

FINDINGS: No acute fracture or dislocation. Mild glenohumeral and
acromioclavicular joint space narrowing. High-riding humeral head.
Bone mineralization is normal. Soft tissues are unremarkable.
IMPRESSION: 1.  No acute osseous abnormality.
2. Mild glenohumeral and acromioclavicular osteoarthritis.
3. High-riding humeral head, suggestive of underlying chronic
rotator cuff tear.

## 2020-05-31 MED ORDER — LEVOTHYROXINE SODIUM 25 MCG PO TABS: ORAL_TABLET | ORAL | 1 refills | Status: DC

## 2020-05-31 MED ORDER — QUINAPRIL-HYDROCHLOROTHIAZIDE 20-25 MG PO TABS: 1.0000 | ORAL_TABLET | Freq: Every day | ORAL | 1 refills | Status: DC

## 2020-05-31 NOTE — Telephone Encounter (Signed)
-----   Message from [place], [facility] sent at 05/31/2020 12:26 PM EDT ----- Regarding: Refills needed Please send refills on the following medications to [facility]: quinapril/HCTZ  Thanks!  Debbora Dus, PharmD Clinical Pharmacist [facility] [facility] at [facility] [phone]

## 2020-05-31 NOTE — Telephone Encounter (Signed)
E-scribed refills.  

## 2020-06-05 NOTE — Chronic Care Management (AMB) (Addendum)
Chronic Care Management Pharmacy Assistant   Name: Justin Benson  MRN: [id number] DOB: [birth year]  Reason for Encounter: Disease State and Medication Review  Patient Questions:  1.  Have you seen any other providers since your last visit? Yes, 05/10/2020- [name] (PCP)  2.  Any changes in your medicines or health? Yes, Amlodipine 10 mg discontinued 05/10/2020.   PCP : [name], MD  Allergies:   Allergies  Allergen Reactions   Amlodipine Other (See Comments)    transaminitis    Medications: Outpatient Encounter Medications as of 05/31/2020  Medication Sig   atorvastatin (LIPITOR) 10 MG tablet Take 0.5 tablets (5 mg total) by mouth daily.   famotidine (PEPCID) 20 MG tablet TAKE 1 TABLET BY MOUTH EVERYDAY AT BEDTIME   ferrous sulfate 325 (65 FE) MG tablet Take 1 tablet (325 mg total) by mouth every other day.   finasteride (PROSCAR) 5 MG tablet TAKE 1 TABLET BY MOUTH IN  THE EVENING   levothyroxine (SYNTHROID) 25 MCG tablet TAKE 1 TABLET BY MOUTH  DAILY BEFORE BREAKFAST   metFORMIN (GLUCOPHAGE-XR) 500 MG 24 hr tablet Take 2 tablets (1,000 mg total) by mouth 2 (two) times daily.   Multiple Vitamin (MULTIVITAMIN WITH MINERALS) TABS Take 1 tablet by mouth daily.   omeprazole (PRILOSEC) 40 MG capsule Take 1 capsule (40 mg total) by mouth daily.   polyethylene glycol powder (GLYCOLAX/MIRALAX) 17 GM/SCOOP powder Take 8.5 g by mouth daily as needed for moderate constipation. Hold for diarrhea   quinapril (ACCUPRIL) 20 MG tablet TAKE 1 TABLET BY MOUTH AT  BEDTIME   quinapril-hydrochlorothiazide (ACCURETIC) 20-25 MG tablet Take 1 tablet by mouth daily.   sertraline (ZOLOFT) 50 MG tablet Take 1 tablet (50 mg total) by mouth daily.   No facility-administered encounter medications on file as of 05/31/2020.    Current Diagnosis: Patient Active Problem List   Diagnosis Date Noted   Hoarseness 05/10/2020   Constipation 02/28/2020   Carotid stenosis 09/12/2019    Interstitial lung disease ([place]) 09/09/2019   Dysphagia 09/06/2019   Nausea 09/06/2019   Bilateral leg edema 02/09/2019   Iron deficiency anemia 08/30/2017   Borderline hypothyroidism 08/30/2017   Kidney cyst, acquired 05/26/2017   Chronic lower back pain 06/24/2016   Serous otitis media with rupture of tympanic membrane 03/28/2016   Abdominal aortic atherosclerosis ([place]) 06/05/2014   Advanced care planning/counseling discussion 27/74/1287   Alcoholic hepatitis without ascites 06/02/2014   Health maintenance examination 06/02/2014   Medicare annual wellness visit, subsequent 06/01/2013   Weight loss 01/11/2013   Transaminitis 12/01/2012   Right shoulder pain 11/30/2012   Schwannoma of spinal cord ([place]) 11/26/2012   DIVERTICULOSIS OF COLON 12/22/2008   Hyperlipidemia associated with type 2 diabetes mellitus ([place]) 01/10/2007   Alcohol use disorder, severe, in sustained remission ([place]) 01/10/2007   Ex-smoker 01/10/2007   MDD (major depressive disorder), recurrent episode, moderate ([place]) 01/10/2007   COPD (chronic obstructive pulmonary disease) ([place]) 01/10/2007   Benign prostatic hyperplasia 01/10/2007   Type 2 diabetes mellitus with other specified complication ([place]) 86/76/7209   Essential hypertension 01/09/2007    Goals Addressed             This Visit's Progress    Pharmacy Care Plan   Not on track    CARE PLAN ENTRY  Current Barriers:  Chronic Disease Management support, education, and care coordination needs related to Hypertension and Diabetes   Hypertension BP Readings from Last 3 Encounters:  01/18/20 132/60  01/06/20 Marland Kitchen)  142/66  11/18/19 (!) 169/70  Home blood pressure monitoring: weekly basis, 120-130s/60s Pharmacist Clinical Goal(s): Over the next 30 days, patient will work with PharmD and providers to maintain BP goal <140/90 mmHg Current regimen:  Quinapril 20 mg/HCTZ 25 mg - 1 tablet daily Quinapril 20 mg - 1 tablet daily Interventions: Reviewed home  blood pressure monitoring. Blood pressures consistently 130-140s/50-60s Pulse 50-60s. .  Discussed amlodipine discontinuation and patient's choice to defer carvedilol.  Patient self care activities - Over the next 30 days, patient will: Continue current medications as prescribed   Diabetes Lab Results  Component Value Date/Time   HGBA1C 7.4 (H) 09/02/2019 09:03 AM   HGBA1C 7.3 (H) 05/07/2019 11:46 AM  Pharmacist Clinical Goal(s): Over the next 30 days, patient will work with PharmD and providers to achieve A1c goal <7% Current regimen:  Metformin 500 mg XR - 2 tablets twice daily with meals  Interventions: Reviewed blood sugar readings. Fasting readings range: 94-137. Postprandial reading range: 115-219. The majority of patient's readings are at or below goal. Patient identified things that contributed to above goal readings.  Patient self care activities - Over the next 30 days, patient will: Continue to check blood sugar daily. Contact provider with any episodes of hypoglycemia (blood glucose < 70)  Schedule comprehensive eye exam   Please see past updates related to this goal by clicking on the "Past Updates" button in the selected goal        Reviewed chart prior to disease state call. Spoke with patient regarding BP  Recent Office Vitals: BP Readings from Last 3 Encounters:  05/10/20 (!) 150/66  03/22/20 (!) 150/60  02/28/20 (!) 154/62   Pulse Readings from Last 3 Encounters:  05/10/20 66  03/22/20 65  02/28/20 73    Wt Readings from Last 3 Encounters:  05/10/20 128 lb 3 oz (58.1 kg)  03/22/20 127 lb (57.6 kg)  02/28/20 129 lb 3 oz (58.6 kg)     Kidney Function Lab Results  Component Value Date/Time   CREATININE 0.80 09/02/2019 09:03 AM   CREATININE 0.75 05/07/2019 11:46 AM   GFR 92.50 09/02/2019 09:03 AM   GFRNONAA 90 (L) 02/26/2013 11:00 AM   GFRAA >90 02/26/2013 11:00 AM    BMP Latest Ref Rng & Units 09/02/2019 05/07/2019 02/09/2019  Glucose 70 - 99 mg/dL  151(H) 106(H) 116(H)  BUN 6 - 23 mg/dL _0 Creatinine 0.40 - 1.50 mg/dL 0.80 0.75 0.76  Sodium 135 - 145 mEq/L 137 138 138  Potassium 3.5 - 5.1 mEq/L 4.5 4.4 4.9  Chloride 96 - 112 mEq/L 100 99 99  CO2 19 - 32 mEq/L 33(H) 33(H) 34(H)  Calcium 8.4 - 10.5 mg/dL 9.5 9.9 9.6   Current antihypertensive regimen:  Quinapril 20 mg/HCTZ 25 mg- 1 tablet daily Quinapril 20 mg- 1 tablet daily How often are you checking your Blood Pressure? twice daily Current home BP readings: 166/68- morning reading, 145/62- afternoon reading, 170/82- morning reading, 137/61- afternoon reading. What recent interventions/DTPs have been made by any provider to improve Blood Pressure control since last CPP Visit: Amlodipine discontinued by PCP on 05/10/2020, asked to bring BP cuff to next appointment to compare readings. Any recent hospitalizations or ED visits since last visit with CPP? No What diet changes have been made to improve Blood Pressure Control?  Patient states he eats salads for lunch, banana and coffee in the morning with medications. What exercise is being done to improve your Blood Pressure Control?  Patient did  not mention any particular exercise routines but he states he keep pretty busy, takes care of himself.  Adherence Review: Is the patient currently on ACE/ARB medication? Yes- Quinapril  Does the patient have > 5 day gap between last estimated fill dates? No  Per Insurance Adherence Review data: ? Medication Adherence for Cholesterol (Statins) (MAC)--- Met: Med Compliance CHOL:  90-99% ? Medication Adherence for Diabetes Medications (MAD)--- Met:  Med Compliance DM:  80-89% ? Medication Adherence for Hypertension (RAS antagonists) [facility])--- Met: Med Compliance HTN:  90-99%  Follow-Up:  Pharmacist Review- Follow up with patient on elevated blood pressure from Medication dispensing call. Patient states his blood pressure are still elevated, he finds that he feel anxious a lot and mostly when  he wakes up, he lives by himself and has to take care of every household chores himself. Patient agreed to check his blood pressure daily and record. Patient aware we will follow up with him next week for Hypertension adherence call.  Debbora Dus, CPP notified.  06/05/2020- Patient called stating when we were reviewing medications on 05/30/2020 for delivery he forgot to mention that he is taking Finasteride 5 mg- 1 tablet daily. Informed patient that medication was not on his profile with [facility] and inquired how many pill he had left. Patient stated he has 2 bottles of this medication from previous pharmacy, he will count pills and call me back.  06/06/2020- Patient returned call and stated he had a total of 93 pills on hand. Informed patient that we will follow up with him monthly to see how many pills he has left. Informed that I will send request to pharmacist Debbora Dus to request PCP to send a prescription to [facility] to place on hold for when he needs. Patient agrees with plan and very thankful for the help. Debbora Dus notified.  Pattricia Boss, [place] Pharmacist Assistant [phone]  I have reviewed the care management and care coordination activities outlined in this encounter and I am certifying that I agree with the content of this note. CCM visit scheduled to discuss high blood pressure readings.  Debbora Dus, PharmD Clinical Pharmacist [place] [facility] at [facility] [phone]

## 2020-06-13 ENCOUNTER — Telehealth: Payer: Self-pay

## 2020-06-13 NOTE — Chronic Care Management (AMB) (Signed)
Chronic Care Management Pharmacy Assistant   Name: SAMIE REASONS  MRN: [id number] DOB: 1935/03/05  Reason for Encounter: Disease State and Medication Review/ Hypertension  PCP : [name], MD  Patient Questions:  1.  Have you seen any other providers since your last visit? No  2.  Any changes in your medicines or health? No   06/13/2020- Called patient to discuss blood pressures, no answer, left message to return call.  06/27/2020- Called patient again to discuss blood pressures, no answer, left message to return call.   Allergies:   Allergies  Allergen Reactions   Amlodipine Other (See Comments)    transaminitis    Medications: Outpatient Encounter Medications as of 06/13/2020  Medication Sig   atorvastatin (LIPITOR) 10 MG tablet Take 0.5 tablets (5 mg total) by mouth daily.   famotidine (PEPCID) 20 MG tablet TAKE 1 TABLET BY MOUTH EVERYDAY AT BEDTIME   ferrous sulfate 325 (65 FE) MG tablet Take 1 tablet (325 mg total) by mouth every other day.   finasteride (PROSCAR) 5 MG tablet TAKE 1 TABLET BY MOUTH IN  THE EVENING   levothyroxine (SYNTHROID) 25 MCG tablet TAKE 1 TABLET BY MOUTH  DAILY BEFORE BREAKFAST   metFORMIN (GLUCOPHAGE-XR) 500 MG 24 hr tablet Take 2 tablets (1,000 mg total) by mouth 2 (two) times daily.   Multiple Vitamin (MULTIVITAMIN WITH MINERALS) TABS Take 1 tablet by mouth daily.   omeprazole (PRILOSEC) 40 MG capsule Take 1 capsule (40 mg total) by mouth daily.   polyethylene glycol powder (GLYCOLAX/MIRALAX) 17 GM/SCOOP powder Take 8.5 g by mouth daily as needed for moderate constipation. Hold for diarrhea   quinapril (ACCUPRIL) 20 MG tablet TAKE 1 TABLET BY MOUTH AT  BEDTIME   quinapril-hydrochlorothiazide (ACCURETIC) 20-25 MG tablet Take 1 tablet by mouth daily.   sertraline (ZOLOFT) 50 MG tablet Take 1 tablet (50 mg total) by mouth daily.   No facility-administered encounter medications on file as of 06/13/2020.    Current  Diagnosis: Patient Active Problem List   Diagnosis Date Noted   Hoarseness 05/10/2020   Constipation 02/28/2020   Carotid stenosis 09/12/2019   Interstitial lung disease ([place]) 09/09/2019   Dysphagia 09/06/2019   Nausea 09/06/2019   Bilateral leg edema 02/09/2019   Iron deficiency anemia 08/30/2017   Borderline hypothyroidism 08/30/2017   Kidney cyst, acquired 05/26/2017   Chronic lower back pain 06/24/2016   Serous otitis media with rupture of tympanic membrane 03/28/2016   Abdominal aortic atherosclerosis ([place]) 06/05/2014   Advanced care planning/counseling discussion 18/29/9371   Alcoholic hepatitis without ascites 06/02/2014   Health maintenance examination 06/02/2014   Medicare annual wellness visit, subsequent 06/01/2013   Weight loss 01/11/2013   Transaminitis 12/01/2012   Right shoulder pain 11/30/2012   Schwannoma of spinal cord ([place]) 11/26/2012   DIVERTICULOSIS OF COLON 12/22/2008   Hyperlipidemia associated with type 2 diabetes mellitus ([place]) 01/10/2007   Alcohol use disorder, severe, in sustained remission ([place]) 01/10/2007   Ex-smoker 01/10/2007   MDD (major depressive disorder), recurrent episode, moderate ([place]) 01/10/2007   COPD (chronic obstructive pulmonary disease) ([place]) 01/10/2007   Benign prostatic hyperplasia 01/10/2007   Type 2 diabetes mellitus with other specified complication ([place]) 69/67/8938   Essential hypertension 01/09/2007   Follow-Up:  Pharmacist Review - Patient also due to medication review for adherence delivery, chart prep completed, waiting on return call from patient. Due for adherence delivery on 06/29/2020:  Reviewed chart for medication changes ahead of medication coordination call. No OVs, Consults,  or hospital visits since last care coordination call/Pharmacist visit.  No medication changes indicated.  Patient obtains medications through Adherence Packaging  30 Days   Last adherence delivery  included: Levothyroxine 25 mcg- 1 tablet daily (before breakfast) Famotidine 20 mg- 1 tablet daily (bedtime) Metformin ER 500 mg- 2 tablets twice daily (breakfast and evening meal) Omeprazole 40 mg- 1 tablet daily (breakfast) Sertraline 50 mg - 1 tablet daily (breakfast) Quinapril 20 mg - 1 tablet daily (evening meal) Quinapril/HCTZ 20/25 mg - 1 tablet daily (breakfast) Atorvastatin 10 mg - 1/2 tablet daily (evening meal)  Patient declined:MiraLAX  last month due to PRN use.  Patient is due for next adherence delivery on: 06/29/2020. 06/13/2020-Called patient to review medications and coordinate delivery, no answer, left message to return call. 06/27/2020- Called patient to review medications and coordinate delivery, no answer, left message to return call.   Debbora Dus, CPP notified.  Pattricia Boss, [facility] Pharmacist Assistant [phone]

## 2020-06-28 ENCOUNTER — Telehealth: Payer: Self-pay

## 2020-06-28 NOTE — Chronic Care Management (AMB) (Signed)
Chronic Care Management Pharmacy Assistant   Name: Justin Benson  MRN: [id number] DOB: 07/13/1935  Reason for Encounter: Medication Review  Patient Questions:  1.  Have you seen any other providers since your last visit? No  2.  Any changes in your medicines or health? No     PCP : [name], MD  Allergies:   Allergies  Allergen Reactions  . Amlodipine Other (See Comments)    transaminitis    Medications: Outpatient Encounter Medications as of 06/28/2020  Medication Sig  . atorvastatin (LIPITOR) 10 MG tablet Take 0.5 tablets (5 mg total) by mouth daily.  . famotidine (PEPCID) 20 MG tablet TAKE 1 TABLET BY MOUTH EVERYDAY AT BEDTIME  . ferrous sulfate 325 (65 FE) MG tablet Take 1 tablet (325 mg total) by mouth every other day.  . finasteride (PROSCAR) 5 MG tablet TAKE 1 TABLET BY MOUTH IN  THE EVENING  . levothyroxine (SYNTHROID) 25 MCG tablet TAKE 1 TABLET BY MOUTH  DAILY BEFORE BREAKFAST  . metFORMIN (GLUCOPHAGE-XR) 500 MG 24 hr tablet Take 2 tablets (1,000 mg total) by mouth 2 (two) times daily.  . Multiple Vitamin (MULTIVITAMIN WITH MINERALS) TABS Take 1 tablet by mouth daily.  Marland Kitchen omeprazole (PRILOSEC) 40 MG capsule Take 1 capsule (40 mg total) by mouth daily.  . polyethylene glycol powder (GLYCOLAX/MIRALAX) 17 GM/SCOOP powder Take 8.5 g by mouth daily as needed for moderate constipation. Hold for diarrhea  . quinapril (ACCUPRIL) 20 MG tablet TAKE 1 TABLET BY MOUTH AT  BEDTIME  . quinapril-hydrochlorothiazide (ACCURETIC) 20-25 MG tablet Take 1 tablet by mouth daily.  . sertraline (ZOLOFT) 50 MG tablet Take 1 tablet (50 mg total) by mouth daily.   No facility-administered encounter medications on file as of 06/28/2020.    Current Diagnosis: Patient Active Problem List   Diagnosis Date Noted  . Hoarseness 05/10/2020  . Constipation 02/28/2020  . Carotid stenosis 09/12/2019  . Interstitial lung disease ([place]) 09/09/2019  . Dysphagia 09/06/2019  . Nausea  09/06/2019  . Bilateral leg edema 02/09/2019  . Iron deficiency anemia 08/30/2017  . Borderline hypothyroidism 08/30/2017  . Kidney cyst, acquired 05/26/2017  . Chronic lower back pain 06/24/2016  . Serous otitis media with rupture of tympanic membrane 03/28/2016  . Abdominal aortic atherosclerosis ([place]) 06/05/2014  . Advanced care planning/counseling discussion 06/02/2014  . Alcoholic hepatitis without ascites 06/02/2014  . Health maintenance examination 06/02/2014  . Medicare annual wellness visit, subsequent 06/01/2013  . Weight loss 01/11/2013  . Transaminitis 12/01/2012  . Right shoulder pain 11/30/2012  . Schwannoma of spinal cord ([place]) 11/26/2012  . DIVERTICULOSIS OF COLON 12/22/2008  . Hyperlipidemia associated with type 2 diabetes mellitus ([place]) 01/10/2007  . Alcohol use disorder, severe, in sustained remission ([place]) 01/10/2007  . Ex-smoker 01/10/2007  . MDD (major depressive disorder), recurrent episode, moderate ([place]) 01/10/2007  . COPD (chronic obstructive pulmonary disease) ([place]) 01/10/2007  . Benign prostatic hyperplasia 01/10/2007  . Type 2 diabetes mellitus with other specified complication ([place]) 85/88/5027  . Essential hypertension 01/09/2007   Reviewed chart for medication changes ahead of medication coordination call.  No OVs, Consults, or hospital visits since last care coordination call/Pharmacist visit. (If appropriate, list visit date, provider name)  No medication changes indicated OR if recent visit, treatment plan here.  BP Readings from Last 3 Encounters:  05/10/20 (!) 150/66  03/22/20 (!) 150/60  02/28/20 (!) 154/62    Lab Results  Component Value Date   HGBA1C 6.3 (A) 05/10/2020  Patient obtains medications through Adherence Packaging  30 Days   Last adherence delivery included: Levothyroxine 25 mcg- 1 tablet daily (before breakfast) Famotidine 20 mg- 1 tablet daily (bedtime) Metformin ER 500 mg- 2 tablets twice daily (breakfast and  evening meal) Omeprazole 40 mg- 1 tablet daily (breakfast) Sertraline 50 mg - 1 tablet daily (breakfast) Quinapril 20 mg - 1 tablet daily (evening meal) Quinapril/HCTZ 20/25 mg - 1 tablet daily (breakfast) Atorvastatin 10 mg - 1/2 tablet daily (evening meal)  Patient declined MiraLAXlast month due to PRN use.   Patient is due for next adherence delivery on: 06/29/20.  Called patient - unable to reach. Per pharmacist medications will be delivered.   This delivery to include: Famotidine 20 mg- 1 tablet daily (bedtime) Atorvastatin 10 mg - 1/2 tablet daily (evening meal) Omeprazole 40 mg- 1 tablet daily (breakfast) Quinapril 20 mg - 1 tablet daily (evening meal) Metformin ER 500 mg- 2 tablets twice daily (breakfast and evening meal) Sertraline 50 mg - 1 tablet daily (breakfast) Quinapril/HCTZ 20/25 mg - 1 tablet daily (breakfast) Levothyroxine 25 mcg- 1 tablet daily (before breakfast)  Patient will not need a short fill or acute fill of any medications.   Patient declined the following medications  Ferrous Sulfate 325 mg- gets OTC Mens multivitamin 50+- gets OTC Polyethylene Glycol- gets OTC Carvedilol 3.125 mg- no longer taking.   Patient does not need refills for any medications.  Confirmed delivery date of 06/29/20 on voicemail, advised patient that pharmacy will contact them the morning of delivery.   Follow-Up:  Coordination of Enhanced Pharmacy Services   Debbora Dus, CPP notified  Margaretmary Dys, [facility] Pharmacy Assistant [phone]

## 2020-07-03 ENCOUNTER — Other Ambulatory Visit: Payer: Self-pay

## 2020-07-03 ENCOUNTER — Ambulatory Visit: Payer: Medicare Other

## 2020-07-03 DIAGNOSIS — I10 Essential (primary) hypertension: Secondary | ICD-10-CM

## 2020-07-03 DIAGNOSIS — E1169 Type 2 diabetes mellitus with other specified complication: Secondary | ICD-10-CM

## 2020-07-03 NOTE — Chronic Care Management (AMB) (Signed)
Chronic Care Management Pharmacy  Name: Justin Benson  MRN: [id number] DOB: 30-Dec-1934  Chief Complaint/ HPI  Justin Benson,  85 y.o., male presents for their Follow-Up CCM visit with the clinical pharmacist via telephone.  PCP : [name], MD  Their chronic conditions include: HTN, COPD, diverticulosis, alcoholic hepatitis, dysphagia, type 2 DM, hyperlipidemia, borderline hypothyroidism, BPH, MDD, IDA, chronic pain   Last CCM visit 03/20/20  Patient denies medication concerns  Office Visits:  05/10/20: PCP - A1c controlled, continue current meds including pepcid at night, holding amlodipine largely resolved transaminitis, will stay off this  01/06/20: PCP - 4 month f/u visit, seen by GI 4/21, still no appetite, no weight gain; fall in April at [facility] with residual shoulder pain, has not taken anything for it, ongoing knee pain due to sudden turn in kitchen, uses brace PRN; Saw GI Justin Benson) 09/2019 for GERD on daily omeprazole, IDA and chronically elevated liver function (reassuring workup). Advised to stop statin for 3 months then reassess LFTs. Advised add pepcid to regimen to combat morning nausea, this has significantly helped. rec f/u labs then OV 01/2020.   Consult Visit:  03/22/20: Justin Benson - off amlodipine for 1 month now, feels quite well, doing much better on Pepcid; Recovering alcoholic however I am not sure if alcoholic liver disease explains his chronically elevated transaminases and alk phos.  Stopping statin did not help, he is 1 month then to a holding of his amlodipine.  We will recheck his LFTs in 2 months and at the same time repeat in antimitochondrial antibody, the last time that was checked was 2014 and it was negative.  I will contact you when I see those results and we will go from there.  10/13/19: Gastro - IDA - good improvement after iron supplements, ferritin normal, hgb 12.4, chronic elevated liver tests since 2014, heavy drinker until stopped alcohol 14  months ago, chronic GERD on omeprazole daily, nausea 3-4 mornings out of the month, plan to order labs to determine cause of elevated LFTs, if no cause, will hold statin for 2-3 months to see if LFT profile improves, no signs of cirrhosis, recommend trial of Pepcid 20 mg at bedtime for morning nausea  Medications: Outpatient Encounter Medications as of 07/03/2020  Medication Sig   atorvastatin (LIPITOR) 10 MG tablet Take 0.5 tablets (5 mg total) by mouth daily.   famotidine (PEPCID) 20 MG tablet TAKE 1 TABLET BY MOUTH EVERYDAY AT BEDTIME   ferrous sulfate 325 (65 FE) MG tablet Take 1 tablet (325 mg total) by mouth every other day.   finasteride (PROSCAR) 5 MG tablet TAKE 1 TABLET BY MOUTH IN  THE EVENING   levothyroxine (SYNTHROID) 25 MCG tablet TAKE 1 TABLET BY MOUTH  DAILY BEFORE BREAKFAST   metFORMIN (GLUCOPHAGE-XR) 500 MG 24 hr tablet Take 2 tablets (1,000 mg total) by mouth 2 (two) times daily.   Multiple Vitamin (MULTIVITAMIN WITH MINERALS) TABS Take 1 tablet by mouth daily.   omeprazole (PRILOSEC) 40 MG capsule Take 1 capsule (40 mg total) by mouth daily.   polyethylene glycol powder (GLYCOLAX/MIRALAX) 17 GM/SCOOP powder Take 8.5 g by mouth daily as needed for moderate constipation. Hold for diarrhea   quinapril (ACCUPRIL) 20 MG tablet TAKE 1 TABLET BY MOUTH AT  BEDTIME   quinapril-hydrochlorothiazide (ACCURETIC) 20-25 MG tablet Take 1 tablet by mouth daily.   sertraline (ZOLOFT) 50 MG tablet Take 1 tablet (50 mg total) by mouth daily.   No facility-administered encounter medications on  file as of 07/03/2020.   Current Diagnosis/Assessment:   Goals Addressed            This Visit's Progress    Pharmacy Care Plan       CARE PLAN ENTRY  Current Barriers:   Chronic Disease Management support, education, and care coordination needs related to Hypertension and Diabetes   Hypertension BP Readings from Last 3 Encounters:  05/10/20 (!) 150/66  03/22/20 (!) 150/60   02/28/20 (!) 154/62   Home blood pressure monitoring: twice daily - ranging 130s-170s/50-60s  Pharmacist Clinical Goal(s): o Over the next 3 months, patient will work with PharmD and providers to achieve BP goal <140/90 mmHg  Current regimen:   Quinapril 20 mg/HCTZ 25 mg - 1 tablet daily  Quinapril 20 mg - 1 tablet daily  Interventions: o Reviewed home blood pressure monitoring. Blood pressures consistently elevated since stopping amlodipine.  o Consider starting new medication - consult PCP  Patient self care activities - Over the next 3 months, patient will: o Continue current medications as prescribed until further notice o Continue monitoring blood pressure and heart rate daily   Diabetes Lab Results  Component Value Date/Time   HGBA1C 6.3 (A) 05/10/2020 09:51 AM   HGBA1C 7.4 (H) 09/02/2019 09:03 AM   HGBA1C 7.3 (H) 05/07/2019 11:46 AM   Pharmacist Clinical Goal(s): o Over the next 3 months, patient will work with PharmD and providers to achieve A1c goal <7%  Current regimen:  o Metformin 500 mg XR - 2 tablets twice daily with meals   Interventions: o Reviewed blood sugar readings. Fasting readings range: 130-140s  Patient self care activities - Over the next 3 months, patient will: o Continue to check blood sugar daily. o Contact provider with any episodes of hypoglycemia (blood glucose < 70)   Please see past updates related to this goal by clicking on the "Past Updates" button in the selected goal       Hypertension   CMP Latest Ref Rng & Units 05/05/2020 02/11/2020 09/02/2019  Glucose 70 - 99 mg/dL - - 151(H)  BUN 6 - 23 mg/dL - - 20  Creatinine 0.40 - 1.50 mg/dL - - 0.80  Sodium 135 - 145 mEq/L - - 137  Potassium 3.5 - 5.1 mEq/L - - 4.5  Chloride 96 - 112 mEq/L - - 100  CO2 19 - 32 mEq/L - - 33(H)  Calcium 8.4 - 10.5 mg/dL - - 9.5  Total Protein 6.0 - 8.3 g/dL 7.3 6.7 6.8  Total Bilirubin 0.2 - 1.2 mg/dL 0.6 0.5 0.5  Alkaline Phos 39 - 117 U/L 105  210(H) 134(H)  AST 0 - 37 U/L 56(H) 154(H) 163(H)  ALT 0 - 53 U/L 46 201(H) 156(H)   Office blood pressures are: BP Readings from Last 3 Encounters:  05/10/20 (!) 150/66  03/22/20 (!) 150/60  02/28/20 (!) 154/62   Patient has failed these meds in the past: amlodipine - elevated LFTs BP goal < 140/90 mmHg (ADA) Patient is currently uncontrolled on the following medications:   Quinapril 20 mg/HCTZ 25 mg - 1 tablet daily  Quinapril 20 mg - 1 tablet daily  At prior visit discussed - denies cost concerns, has been quinapril/hctz + quinapril since 2008, dosing appropriate  Update 07/03/20:  BP today checked at home during visit - 160/68, 62 (12/6 8 AM) Denies eating or having any coffee today, checked BP before all meds except levothyroxine  Pt has reported high SBP the mornings. Off amlodipine since  02/23/20 due to transaminitis. SBP is down to 130-140s in the evenings. Reports if BP is > 150, he will recheck and still high. Reports the following recent readings:  135/64, 67  141/62, 66  152/57, 64  146/61, 62  171/63, 60  115/67, 61  134/43, 69  120/58. 65  142/50, 63  130/54, 71  163/75, 61  178/76, 56  181/66, 54  170/74, 54  163/79, 59  136/56, 64  PCP prescribed carvedilol to replace amlodipine in August, pt chose not to start and PCP approved. Will consult PCP to see about restarting this.   Plan: Continue current medications; Consult PCP regarding carvedilol or alternative.   Hyperlipidemia   LDL goal < 70  Lipid Panel     Component Value Date/Time   CHOL 142 09/02/2019 0903   TRIG 101.0 09/02/2019 0903   HDL 58.00 09/02/2019 0903   LDLCALC 63 09/02/2019 0903   LDLDIRECT 84.5 05/26/2012 0836    Hepatic Function Latest Ref Rng & Units 05/05/2020 02/11/2020 09/02/2019  Total Protein 6.0 - 8.3 g/dL 7.3 6.7 6.8  Albumin 3.5 - 5.2 g/dL 4.6 4.4 4.4  AST 0 - 37 U/L 56(H) 154(H) 163(H)  ALT 0 - 53 U/L 46 201(H) 156(H)  Alk Phosphatase 39 - 117 U/L  105 210(H) 134(H)  Total Bilirubin 0.2 - 1.2 mg/dL 0.6 0.5 0.5  Bilirubin, Direct 0.0 - 0.3 mg/dL 0.2 0.1 -    The ASCVD Risk score (Goulding., et al., 2013) failed to calculate for the following reasons:   The 2013 ASCVD risk score is only valid for ages 57 to 3   Patient has failed these meds in past: none Patient is currently on the following medications:   Atorvastatin 10 mg - 1 tablet daily  We discussed: Lipid panel from 02/21 patient was on atorvastatin 10 mg, placed on hold per GI in March until next follow up 03/22/20 in order to assess LFTs  Update 07/03/20: Pt has resumed atorvastatin 10 mg daily as LFTs were not impacted by holding statin. Walking around block 1/2 mile twice daily. Works in the yard a lot.  Plan: Continue current medications  COPD / Tobacco   Last spirometry score: none per medical record  Eosinophil count:   Lab Results  Component Value Date/Time   EOSPCT 2.1 09/02/2019 09:03 AM  %                               Eos (Absolute):  Lab Results  Component Value Date/Time   EOSABS 0.1 09/02/2019 09:03 AM   Tobacco Status:  Social History   Tobacco Use  Smoking Status Former Smoker   Packs/day: 2.00   Years: 30.00   Pack years: 60.00   Types: Cigarettes   Start date: 07/29/1962   Quit date: 07/29/1992   Years since quitting: 27.9  Smokeless Tobacco Never Used   Patient has failed these meds in past: none reported Patient is currently controlled on the following medications:   No pharmacotherapy  We discussed: denies SOB or limitation of activity  No update/changes 07/03/20.  Plan: Continue management without medications   Diabetes   Saw endo 05/10/20 Recent Relevant Labs: Lab Results  Component Value Date/Time   HGBA1C 6.3 (A) 05/10/2020 09:51 AM   HGBA1C 7.4 (H) 09/02/2019 09:03 AM   HGBA1C 7.3 (H) 05/07/2019 11:46 AM   MICROALBUR <0.7 06/02/2015 08:28 AM   MICROALBUR 1.0 05/25/2013 08:39  AM    Checking BG: daily before  breakfast or 2 hours after first bite    A1c goal < 7% Patient has failed these meds in past: none  Patient is currently controlled on the following medications:   Metformin 500 mg XR - 2 tablets BID  Last diabetic foot exam: PCP checks feet at each appt  Lab Results  Component Value Date/Time   HMDIABEYEEXA No Retinopathy 03/21/2020 12:00 AM    Update 07/03/20: Reports fasting readings have been higher - 130s-140s lately. Reports his appetite has been up but has been eating healthier. Weight today is 130 lbs. A1c October controlled. Encouraged him to continue eating well and monitoring BG.   Diet: Reads food labels, tries to choose low sodium, low fat, and low carb items  Breakfast: eggs or cereal (plain cheerios with pumpkin seed and flax granola and half sliced peach with parmalat milk)  Lunch/Dinner: Eats a log of vegetables from his garden (greens, tomatoes, squash), soup, chicken salad   Plan: Continue current medications  Hypothyroidism   Lab Results  Component Value Date/Time   TSH 3.64 09/02/2019 09:03 AM   TSH 3.41 05/07/2019 11:46 AM   FREET4 1.04 05/07/2019 11:46 AM   FREET4 0.89 01/01/2019 10:57 AM   Patient has failed these meds in past: none Patient is currently controlled on the following medications:   Levothyroxine 25 mcg - 1 tablet daily before breakfast  We discussed:  Started 12/2018, noticed improvement in energy  No updates/changes 07/03/20  Plan: Continue current medications  Depression   Patient has failed these meds in past: none  Patient is currently controlled on the following medications:   Sertraline 50 mg - 1 tablet daily  We discussed: patient reports mood is well controlled, avoids missed doses; would like to come off of it but has noticed when he tries to reduce it, irritability worsens  Update 07/03/20: Reports mood is still about the same. 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 Talks  to family on the phone often. Tries to limit driving. Would like more interaction with others in person. Gets out twice a day to walk around block and will chat with neighbors. Avoids gatherings/restaurants due to COVID.   Plan: Continue current medications; Encouraged resuming volunteering as soon as able, begin to resume gatherings with 1-2 friends.  Dysphagia   Patient has failed these meds in past: none  Patient is currently controlled on the following medications:   Omeprazole 40 mg - 1 capsule daily  Famotidine 20 mg - 1 tablet daily at bedtime   We discussed: reports doing much better since adding famotidine, denies any reoccurrence of nausea  No updates/changes 07/03/20  Plan: Continue current medications   IDA   Iron/TIBC/Ferritin/ %Sat    Component Value Date/Time   IRON 86 10/13/2019 1001   IRON 76 10/13/2019 1001   TIBC 378 10/13/2019 1001   FERRITIN 38.4 10/13/2019 1001   IRONPCTSAT 21.3 10/13/2019 1001   IRONPCTSAT 20 10/13/2019 1001   CBC Latest Ref Rng & Units 09/02/2019 05/07/2019 01/01/2019  WBC 4.0 - 10.5 K/uL 4.2 4.7 4.5  Hemoglobin 13.0 - 17.0 g/dL 13.4 13.3 12.9(L)  Hematocrit 39 - 52 % 40.8 39.4 37.6(L)  Platelets 150 - 400 K/uL 207.0 208.0 188.0   Patient has failed these meds in past: none Patient is currently controlled on the following medications:   Ferrous sulfate 325 mg - 1 tablet every other day  We discussed: confirms  adherence, denies constipation, ferritin and CBC WNL  No updates/changes 07/03/20 - continues iron every other day and multivitamin  Plan: Continue current medications   BPH   Patient has failed these meds in past: tamsulosin (per chart, ineffective) Patient is currently controlled on the following medications:   (Proscar) Finasteride 5 mg - 1 tablet daily  We discussed: reports main concern is dribbling/weak stream, worsened in mornings after coffee and HCTZ, denies interest in medication changes, believes its age  related  Update 07/03/20: Coordinate med sync - pt has excess supply of finasteride and would like to sync with his other medications.  Plan: Continue current medications   Medication Management   OTCs: Multivitamin, Citrucel and MiraLAX PRN constipation, not taking often Current pharmacy: [facility] Adherence: no medications with > 5 day gap between fills, using adherence packaging Affordability: denies concerns  As of 06/06/20 - Pt had 93 tabs finasteride 5 mg daily on hand. Will add to packs once out of this.    Plan: Continue current medication management strategy. Sync finasteride with other medications - request refill.   CCM Follow Up: 3 months, telephone  Debbora Dus, PharmD Clinical Pharmacist [facility] at [facility] [phone]

## 2020-07-05 ENCOUNTER — Telehealth: Payer: Self-pay

## 2020-07-05 ENCOUNTER — Other Ambulatory Visit: Payer: Medicare Other

## 2020-07-05 DIAGNOSIS — R748 Abnormal levels of other serum enzymes: Secondary | ICD-10-CM

## 2020-07-05 LAB — HEPATIC FUNCTION PANEL
ALT: 64 U/L — ABNORMAL HIGH (ref 0–53)
AST: 30 U/L (ref 0–37)
Albumin: 4.6 g/dL (ref 3.5–5.2)
Alkaline Phosphatase: 104 U/L (ref 39–117)
Bilirubin, Direct: 0.1 mg/dL (ref 0.0–0.3)
Total Bilirubin: 0.5 mg/dL (ref 0.2–1.2)
Total Protein: 7.2 g/dL (ref 6.0–8.3)

## 2020-07-05 MED ORDER — FINASTERIDE 5 MG PO TABS
5.0000 mg | ORAL_TABLET | Freq: Every evening | ORAL | 0 refills | Status: DC
Start: 2020-07-05 — End: 2020-09-19

## 2020-07-05 NOTE — Telephone Encounter (Signed)
E-scribed refill 

## 2020-07-05 NOTE — Patient Instructions (Addendum)
Dear Justin Benson,  Below is a summary of the goals we discussed during our follow up appointment on July 05, 2020. Please contact me anytime with questions or concerns.   Visit Information  Goals Addressed            This Visit's Progress   . Pharmacy Care Plan       CARE PLAN ENTRY  Current Barriers:  . Chronic Disease Management support, education, and care coordination needs related to Hypertension and Diabetes   Hypertension BP Readings from Last 3 Encounters:  05/10/20 (!) 150/66  03/22/20 (!) 150/60  02/28/20 (!) 154/62 .  Home blood pressure monitoring: twice daily - ranging 130s-170s/50-60s . Pharmacist Clinical Goal(s): o Over the next 3 months, patient will work with PharmD and providers to achieve BP goal <140/90 mmHg . Current regimen:   Quinapril 20 mg/HCTZ 25 mg - 1 tablet daily  Quinapril 20 mg - 1 tablet daily . Interventions: o Reviewed home blood pressure monitoring. Blood pressures consistently elevated since stopping amlodipine.  o Consider starting new medication - consult PCP . Patient self care activities - Over the next 3 months, patient will: o Continue current medications as prescribed until further notice o Continue monitoring blood pressure and heart rate daily   Diabetes Lab Results  Component Value Date/Time   HGBA1C 6.3 (A) 05/10/2020 09:51 AM   HGBA1C 7.4 (H) 09/02/2019 09:03 AM   HGBA1C 7.3 (H) 05/07/2019 11:46 AM .  Pharmacist Clinical Goal(s): o Over the next 3 months, patient will work with PharmD and providers to achieve A1c goal <7% . Current regimen:  o Metformin 500 mg XR - 2 tablets twice daily with meals  . Interventions: o Reviewed blood sugar readings. Fasting readings range: 130-140s . Patient self care activities - Over the next 3 months, patient will: o Continue to check blood sugar daily. o Contact provider with any episodes of hypoglycemia (blood glucose < 70)   Please see past updates related to this goal by  clicking on the "Past Updates" button in the selected goal        The patient verbalized understanding of instructions, educational materials, and care plan provided today and declined offer to receive copy of patient instructions, educational materials, and care plan.   Telephone follow up appointment with pharmacy team member scheduled for: 10/03/20 at 8:30 AM   Debbora Dus, PharmD Clinical Pharmacist [facility] at [facility] [phone]   How to Take Your Blood Pressure You can take your blood pressure at home with a machine. You may need to check your blood pressure at home:  To check if you have high blood pressure (hypertension).  To check your blood pressure over time.  To make sure your blood pressure medicine is working. Supplies needed: You will need a blood pressure machine, or monitor. You can buy one at a drugstore or online. When choosing one:  Choose one with an arm cuff.  Choose one that wraps around your upper arm. Only one finger should fit between your arm and the cuff.  Do not choose one that measures your blood pressure from your wrist or finger. Your doctor can suggest a monitor. How to prepare Avoid these things for 30 minutes before checking your blood pressure:  Drinking caffeine.  Drinking alcohol.  Eating.  Smoking.  Exercising. Five minutes before checking your blood pressure:  Pee.  Sit in a dining chair. Avoid sitting in a soft couch or armchair.  Be quiet.  Do not talk. How to take your blood pressure Follow the instructions that came with your machine. If you have a digital blood pressure monitor, these may be the instructions: 1. Sit up straight. 2. Place your feet on the floor. Do not cross your ankles or legs. 3. Rest your left arm at the level of your heart. You may rest it on a table, desk, or chair. 4. Pull up your shirt sleeve. 5. Wrap the blood pressure cuff around the upper part of your left arm. The cuff  should be 1 inch (2.5 cm) above your elbow. It is best to wrap the cuff around bare skin. 6. Fit the cuff snugly around your arm. You should be able to place only one finger between the cuff and your arm. 7. Put the cord inside the groove of your elbow. 8. Press the power button. 9. Sit quietly while the cuff fills with air and loses air. 10. Write down the numbers on the screen. 11. Wait 2-3 minutes and then repeat steps 1-10. What do the numbers mean? Two numbers make up your blood pressure. The first number is called systolic pressure. The second is called diastolic pressure. An example of a blood pressure reading is "120 over 80" (or 120/80). If you are an adult and do not have a medical condition, use this guide to find out if your blood pressure is normal: Normal  First number: below 120.  Second number: below 80. Elevated  First number: 120-129.  Second number: below 80. Hypertension stage 1  First number: 130-139.  Second number: 80-89. Hypertension stage 2  First number: 140 or above.  Second number: 103 or above. Your blood pressure is above normal even if only the top or bottom number is above normal. Follow these instructions at home:  Check your blood pressure as often as your doctor tells you to.  Take your monitor to your next doctor's appointment. Your doctor will: ? Make sure you are using it correctly. ? Make sure it is working right.  Make sure you understand what your blood pressure numbers should be.  Tell your doctor if your medicines are causing side effects. Contact a doctor if:  Your blood pressure keeps being high. Get help right away if:  Your first blood pressure number is higher than 180.  Your second blood pressure number is higher than 120. This information is not intended to replace advice given to you by your health care provider. Make sure you discuss any questions you have with your health care provider. Document Revised: 06/27/2017  Document Reviewed: 12/22/2015 Elsevier Patient Education  2020 [employer].

## 2020-07-05 NOTE — Telephone Encounter (Signed)
PCP consult regarding CCM 07/03/20:  Pt is concerned about elevated BP readings in the morning (SBP 160-170 usually) since he stopped amlodipine. Reports his evening BP are better (SBP 120-140). When his BP is elevated in the morning he rechecks and it is usually still high. He was taken off amlodipine due to transaminitis. He was prescribed carvedilol but deferred and there was some concern regarding bradycardia. We discussed starting carvedilol 3.125 mg BID and continuing to monitor BP/HR daily pending PCP approval. If approved, [facility] still has a 30 day supply of carvedilol 3/125 mg BID on file, no new prescription needed at this time.  He is currently taking:  Quinapril 20 mg/HCTZ 25 mg - 1 tablet daily  Quinapril 20 mg - 1 tablet daily  Reports the following recent BP readings, checking twice daily:  135/64, 67  141/62, 66  152/57, 64  146/61, 62  171/63, 60  115/67, 61  134/43, 69  120/58. 65  142/50, 63  130/54, 71  163/75, 61  178/76, 56  181/66, 54  170/74, 54  163/79, 59  136/56, 64  Debbora Dus, PharmD Corporate treasurer Primary Care at [facility] [phone]

## 2020-07-05 NOTE — Progress Notes (Signed)
I have collaborated with the care management provider regarding care management and care coordination activities outlined in this encounter and have reviewed this encounter including documentation in the note and care plan. I am certifying that I agree with the content of this note and encounter as supervising physician.  

## 2020-07-05 NOTE — Telephone Encounter (Signed)
Yes let's start carvedilol 3.125mg  BID for the next month. Please have him keep an eye on BP as well as pulse and let us know if HR dropping below 50 or if he has trouble tolerating new medicine to start different medication.

## 2020-07-05 NOTE — Telephone Encounter (Signed)
-----   Message from Debbora Dus, [facility] sent at 07/05/2020 12:25 PM EST ----- Regarding: Refill Justin Benson is requesting a refill on finasteride.   Please send refills to [facility].  Thank you,  Debbora Dus, PharmD Clinical Pharmacist [facility] at [facility] [phone]

## 2020-07-05 NOTE — Telephone Encounter (Signed)
-----   Message from Yevette Edwards, RN sent at 05/05/2020  1:07 PM EDT ----- Regarding: Labs Repeat hepatic function panel, order in epic

## 2020-07-05 NOTE — Telephone Encounter (Signed)
Left detailed message on home phone reminding patient that he is due for repeat labs at this time. Advised that no appt is necessary and that he can stop by the lab in the basement at his convenience between 7:30 AM - 5 PM, Monday through Friday. Advised patient to give [place] a call if he had any questions.

## 2020-07-06 ENCOUNTER — Telehealth: Payer: Self-pay

## 2020-07-06 NOTE — Chronic Care Management (AMB) (Addendum)
    Chronic Care Management Pharmacy Assistant   Name: ROMANI WILBON  MRN: [id number] DOB: 1935/07/14  Reason for Encounter: Medication Review   PCP : [name], MD  Allergies:   Allergies  Allergen Reactions   Amlodipine Other (See Comments)    transaminitis    Medications: Outpatient Encounter Medications as of 07/06/2020  Medication Sig   atorvastatin (LIPITOR) 10 MG tablet Take 0.5 tablets (5 mg total) by mouth daily.   famotidine (PEPCID) 20 MG tablet TAKE 1 TABLET BY MOUTH EVERYDAY AT BEDTIME   ferrous sulfate 325 (65 FE) MG tablet Take 1 tablet (325 mg total) by mouth every other day.   finasteride (PROSCAR) 5 MG tablet Take 1 tablet (5 mg total) by mouth every evening.   levothyroxine (SYNTHROID) 25 MCG tablet TAKE 1 TABLET BY MOUTH  DAILY BEFORE BREAKFAST   metFORMIN (GLUCOPHAGE-XR) 500 MG 24 hr tablet Take 2 tablets (1,000 mg total) by mouth 2 (two) times daily.   Multiple Vitamin (MULTIVITAMIN WITH MINERALS) TABS Take 1 tablet by mouth daily.   omeprazole (PRILOSEC) 40 MG capsule Take 1 capsule (40 mg total) by mouth daily.   polyethylene glycol powder (GLYCOLAX/MIRALAX) 17 GM/SCOOP powder Take 8.5 g by mouth daily as needed for moderate constipation. Hold for diarrhea   quinapril (ACCUPRIL) 20 MG tablet TAKE 1 TABLET BY MOUTH AT  BEDTIME   quinapril-hydrochlorothiazide (ACCURETIC) 20-25 MG tablet Take 1 tablet by mouth daily.   sertraline (ZOLOFT) 50 MG tablet Take 1 tablet (50 mg total) by mouth daily.   No facility-administered encounter medications on file as of 07/06/2020.    Current Diagnosis: Patient Active Problem List   Diagnosis Date Noted   Hoarseness 05/10/2020   Constipation 02/28/2020   Carotid stenosis 09/12/2019   Interstitial lung disease ([place]) 09/09/2019   Dysphagia 09/06/2019   Nausea 09/06/2019   Bilateral leg edema 02/09/2019   Iron deficiency anemia 08/30/2017   Borderline hypothyroidism 08/30/2017   Kidney cyst, acquired  05/26/2017   Chronic lower back pain 06/24/2016   Serous otitis media with rupture of tympanic membrane 03/28/2016   Abdominal aortic atherosclerosis ([place]) 06/05/2014   Advanced care planning/counseling discussion 37/16/9678   Alcoholic hepatitis without ascites 06/02/2014   Health maintenance examination 06/02/2014   Medicare annual wellness visit, subsequent 06/01/2013   Weight loss 01/11/2013   Transaminitis 12/01/2012   Right shoulder pain 11/30/2012   Schwannoma of spinal cord ([place]) 11/26/2012   DIVERTICULOSIS OF COLON 12/22/2008   Hyperlipidemia associated with type 2 diabetes mellitus ([place]) 01/10/2007   Alcohol use disorder, severe, in sustained remission ([place]) 01/10/2007   Ex-smoker 01/10/2007   MDD (major depressive disorder), recurrent episode, moderate ([place]) 01/10/2007   COPD (chronic obstructive pulmonary disease) ([place]) 01/10/2007   Benign prostatic hyperplasia 01/10/2007   Type 2 diabetes mellitus with other specified complication ([place]) 93/81/0175   Essential hypertension 01/09/2007   Mr. Neyland was contacted to discuss medication change due to his most recent blood pressure readings. [name] would like patient to start taking carvedilol twice daily. After speaking with Mr. Paver a delivery was arranged for today. Patient is aware. A follow up date, 07/14/20, was set up to go over new blood pressure readings. Patient is aware I will contact him.   Follow-Up:  Pharmacist Review   Debbora Dus, CPP notified  Margaretmary Dys, [place] Pharmacy Assistant [phone]

## 2020-07-06 NOTE — Telephone Encounter (Signed)
Great. My assistant, Ria Comment, will call him today and schedule the delivery. We will follow up in 7-14 days to review home monitoring of BP/HR.  Thanks!

## 2020-07-10 ENCOUNTER — Other Ambulatory Visit: Payer: Self-pay

## 2020-07-10 DIAGNOSIS — R748 Abnormal levels of other serum enzymes: Secondary | ICD-10-CM

## 2020-07-14 ENCOUNTER — Telehealth: Payer: Self-pay

## 2020-07-14 NOTE — Chronic Care Management (AMB) (Signed)
Chronic Care Management Pharmacy Assistant   Name: Justin Benson  MRN: [id number] DOB: September 04, 1934  Reason for Encounter: Disease State  Justin Benson was contacted today per Debbora Dus to review last week to 2 weeks of blood pressure and pulse readings since starting Carvedilol. Readings are as follows:  07/13/20-  115/166 p. 74  6:00 pm  07/12/20 - 137/57 p. 54  6:00 AM  07/11/20 - 142/54 p. 57  7:30 pm       139/58 p.55  7:50 pm       148/58 p.55  8:40 pm        07/10/20 -146/63 p. 62 5:50pm  07/10/11 -137/57 p 72 5:30 pm      131/57 p. 70 6:10 pm  07/09/11- 145/57 p. 60 8:45pm       117/45 p. 61 9:00 pm       123/51 p. 62 9:15pm  07/07/20 - 148/61 p 63 8pm       146/66 p. 63 8:15pm  07/06/20- 178/76 p. 68 10:30pm    150/73 p. 60 6:45pm  PCP : [name], MD  Allergies:   Allergies  Allergen Reactions  . Amlodipine Other (See Comments)    transaminitis    Medications: Outpatient Encounter Medications as of 07/14/2020  Medication Sig  . atorvastatin (LIPITOR) 10 MG tablet Take 0.5 tablets (5 mg total) by mouth daily.  . famotidine (PEPCID) 20 MG tablet TAKE 1 TABLET BY MOUTH EVERYDAY AT BEDTIME  . ferrous sulfate 325 (65 FE) MG tablet Take 1 tablet (325 mg total) by mouth every other day.  . finasteride (PROSCAR) 5 MG tablet Take 1 tablet (5 mg total) by mouth every evening.  Marland Kitchen levothyroxine (SYNTHROID) 25 MCG tablet TAKE 1 TABLET BY MOUTH  DAILY BEFORE BREAKFAST  . metFORMIN (GLUCOPHAGE-XR) 500 MG 24 hr tablet Take 2 tablets (1,000 mg total) by mouth 2 (two) times daily.  . Multiple Vitamin (MULTIVITAMIN WITH MINERALS) TABS Take 1 tablet by mouth daily.  Marland Kitchen omeprazole (PRILOSEC) 40 MG capsule Take 1 capsule (40 mg total) by mouth daily.  . polyethylene glycol powder (GLYCOLAX/MIRALAX) 17 GM/SCOOP powder Take 8.5 g by mouth daily as needed for moderate constipation. Hold for diarrhea  . quinapril (ACCUPRIL) 20 MG tablet TAKE 1 TABLET BY MOUTH AT   BEDTIME  . quinapril-hydrochlorothiazide (ACCURETIC) 20-25 MG tablet Take 1 tablet by mouth daily.  . sertraline (ZOLOFT) 50 MG tablet Take 1 tablet (50 mg total) by mouth daily.   No facility-administered encounter medications on file as of 07/14/2020.    Current Diagnosis: Patient Active Problem List   Diagnosis Date Noted  . Hoarseness 05/10/2020  . Constipation 02/28/2020  . Carotid stenosis 09/12/2019  . Interstitial lung disease ([place]) 09/09/2019  . Dysphagia 09/06/2019  . Nausea 09/06/2019  . Bilateral leg edema 02/09/2019  . Iron deficiency anemia 08/30/2017  . Borderline hypothyroidism 08/30/2017  . Kidney cyst, acquired 05/26/2017  . Chronic lower back pain 06/24/2016  . Serous otitis media with rupture of tympanic membrane 03/28/2016  . Abdominal aortic atherosclerosis ([place]) 06/05/2014  . Advanced care planning/counseling discussion 06/02/2014  . Alcoholic hepatitis without ascites 06/02/2014  . Health maintenance examination 06/02/2014  . Medicare annual wellness visit, subsequent 06/01/2013  . Weight loss 01/11/2013  . Transaminitis 12/01/2012  . Right shoulder pain 11/30/2012  . Schwannoma of spinal cord ([place]) 11/26/2012  . DIVERTICULOSIS OF COLON 12/22/2008  . Hyperlipidemia associated with type 2 diabetes mellitus ([place]) 01/10/2007  . Alcohol  use disorder, severe, in sustained remission ([place]) 01/10/2007  . Ex-smoker 01/10/2007  . MDD (major depressive disorder), recurrent episode, moderate ([place]) 01/10/2007  . COPD (chronic obstructive pulmonary disease) ([place]) 01/10/2007  . Benign prostatic hyperplasia 01/10/2007  . Type 2 diabetes mellitus with other specified complication ([place]) 11/00/3496  . Essential hypertension 01/09/2007    Goals Addressed   None     Follow-Up:  Pharmacist Review   Debbora Dus, CPP notified  Margaretmary Dys, [place] Pharmacy Assistant [phone]

## 2020-07-18 ENCOUNTER — Telehealth: Payer: Self-pay

## 2020-07-18 MED ORDER — CARVEDILOL 3.125 MG PO TABS: 3.1250 mg | ORAL_TABLET | Freq: Two times a day (BID) | ORAL | 0 refills | Status: DC

## 2020-07-18 NOTE — Chronic Care Management (AMB) (Addendum)
Chronic Care Management Pharmacy Assistant   Name: Justin Benson  MRN: [id number] DOB: [birth year]  Reason for Encounter: Medication Review  Patient Questions:  1.  Have you seen any other providers since your last visit? No  2.  Any changes in your medicines or health? Yes Patient states he started back on Carvedilol about 10 days ago.      PCP : [name], MD  Allergies:   Allergies  Allergen Reactions   Amlodipine Other (See Comments)    transaminitis    Medications: Outpatient Encounter Medications as of 07/18/2020  Medication Sig   atorvastatin (LIPITOR) 10 MG tablet Take 0.5 tablets (5 mg total) by mouth daily.   famotidine (PEPCID) 20 MG tablet TAKE 1 TABLET BY MOUTH EVERYDAY AT BEDTIME   ferrous sulfate 325 (65 FE) MG tablet Take 1 tablet (325 mg total) by mouth every other day.   finasteride (PROSCAR) 5 MG tablet Take 1 tablet (5 mg total) by mouth every evening.   levothyroxine (SYNTHROID) 25 MCG tablet TAKE 1 TABLET BY MOUTH  DAILY BEFORE BREAKFAST   metFORMIN (GLUCOPHAGE-XR) 500 MG 24 hr tablet Take 2 tablets (1,000 mg total) by mouth 2 (two) times daily.   Multiple Vitamin (MULTIVITAMIN WITH MINERALS) TABS Take 1 tablet by mouth daily.   omeprazole (PRILOSEC) 40 MG capsule Take 1 capsule (40 mg total) by mouth daily.   polyethylene glycol powder (GLYCOLAX/MIRALAX) 17 GM/SCOOP powder Take 8.5 g by mouth daily as needed for moderate constipation. Hold for diarrhea   quinapril (ACCUPRIL) 20 MG tablet TAKE 1 TABLET BY MOUTH AT  BEDTIME   quinapril-hydrochlorothiazide (ACCURETIC) 20-25 MG tablet Take 1 tablet by mouth daily.   sertraline (ZOLOFT) 50 MG tablet Take 1 tablet (50 mg total) by mouth daily.   No facility-administered encounter medications on file as of 07/18/2020.    Current Diagnosis: Patient Active Problem List   Diagnosis Date Noted   Hoarseness 05/10/2020   Constipation 02/28/2020   Carotid stenosis 09/12/2019   Interstitial lung  disease ([place]) 09/09/2019   Dysphagia 09/06/2019   Nausea 09/06/2019   Bilateral leg edema 02/09/2019   Iron deficiency anemia 08/30/2017   Borderline hypothyroidism 08/30/2017   Kidney cyst, acquired 05/26/2017   Chronic lower back pain 06/24/2016   Serous otitis media with rupture of tympanic membrane 03/28/2016   Abdominal aortic atherosclerosis ([place]) 06/05/2014   Advanced care planning/counseling discussion 35/00/9381   Alcoholic hepatitis without ascites 06/02/2014   Health maintenance examination 06/02/2014   Medicare annual wellness visit, subsequent 06/01/2013   Weight loss 01/11/2013   Transaminitis 12/01/2012   Right shoulder pain 11/30/2012   Schwannoma of spinal cord ([place]) 11/26/2012   DIVERTICULOSIS OF COLON 12/22/2008   Hyperlipidemia associated with type 2 diabetes mellitus ([place]) 01/10/2007   Alcohol use disorder, severe, in sustained remission ([place]) 01/10/2007   Ex-smoker 01/10/2007   MDD (major depressive disorder), recurrent episode, moderate ([place]) 01/10/2007   COPD (chronic obstructive pulmonary disease) ([place]) 01/10/2007   Benign prostatic hyperplasia 01/10/2007   Type 2 diabetes mellitus with other specified complication ([place]) 82/99/3716   Essential hypertension 01/09/2007   Reviewed chart for medication changes ahead of medication coordination call.  No OVs, Consults, or hospital visits since last care coordination call/Pharmacist visit.   Medication changes indicated : Patient states he started back on carvedilol about 10 days ago.   BP Readings from Last 3 Encounters:  05/10/20 (!) 150/66  03/22/20 (!) 150/60  02/28/20 (!) 154/62    Lab  Results  Component Value Date   HGBA1C 6.3 (A) 05/10/2020     Patient obtains medications through Adherence Packaging  30 Days   Last adherence delivery included:  Atorvastatin 10 mg - 1/2 tablet daily (evening meal)  Famotidine 20 mg- 1 tablet daily (bedtime)  Levothyroxine 25 mcg- 1 tablet daily (before  breakfast)   Metformin ER 500 mg- 2 tablets twice daily (breakfast and evening meal)  Omeprazole 40 mg- 1 tablet daily (breakfast)  Quinapril/HCTZ 20/25 mg - 1 tablet daily (breakfast)  Quinapril 20 mg - 1 tablet daily (evening meal)  Sertraline 50 mg - 1 tablet daily (breakfast)   Patient declined the following medications last month: Carvedilol 3.125 - Pt declined to start intially Ferrous Sulfate 325mg  getting OTC  Mens multivitamin getting OTC  Polyethylene glycol getting OTC     Patient is due for next adherence delivery on: 07/29/2020.  Called patient and reviewed medications and coordinated delivery.  This delivery to include: Atorvastatin 10 mg - 1/2 tablet daily (evening meal)  Carvedilol 3.125 1 tablet twice daily (breakfast and dinner) restarted 9-10 days ago Famotidine 20 mg- 1 tablet daily (bedtime)   Levothyroxine 25 mcg- 1 tablet daily (before breakfast)   Metformin ER 500 mg- 2 tablets twice daily (breakfast and evening meal)  Omeprazole 40 mg- 1 tablet daily (breakfast)  Quinapril/HCTZ 20/25 mg - 1 tablet daily (breakfast)  Quinapril 20 mg - 1 tablet daily (evening meal)   Sertraline 50 mg - 1 tablet daily (breakfast)   Patient will not need a short or acute fill of any medications.  Patient declined the following medications  Ferrous Sulfate 325mg  getting OTC  Finasteride 5 mg 1 tablet daily (evening meal) per patient- Ok until February. Had 93 left 06/06/20 Mens multivitamin getting OTC  Polyethylene glycol getting OTC    Patient needs refills for Carvedilol 3.125- Requested 07/18/20 -Dr. Danise Mina.  Confirmed delivery date of 08/01/2020, advised patient that pharmacy will contact them the morning of delivery.   Justin Benson states that his recent blood pressures are as follows 07/17/20- 162/52 p. 57, 07/13/20-  115/166 p. 74  6:00 pm,  07/12/20 - 137/57 p. 54  6:00 AM. I inquired about his blood sugars but he notes he has packed everything up as he is going  out of town for 2 weeks. States he has only had 2 "highs around 140" states usual readings are "113-120's".  States he is going to [place] today to visit with his daughter over Christmas and will not be back home until 08/01/2020   Follow-Up:  Comptroller and Pharmacist Review

## 2020-07-18 NOTE — Telephone Encounter (Signed)
E-scribed refill 

## 2020-07-18 NOTE — Telephone Encounter (Signed)
-----   Message from Debbora Dus, [facility] sent at 07/18/2020 10:31 AM EST ----- Regarding: Refill request Patient needs a refill on carvedilol. Please send to [facility].  Thanks,  Debbora Dus, PharmD Clinical Pharmacist [facility] [phone]

## 2020-08-11 ENCOUNTER — Telehealth: Payer: Self-pay

## 2020-08-11 NOTE — Chronic Care Management (AMB) (Signed)
Chronic Care Management Pharmacy Assistant   Name: Justin Benson  MRN: [id number] DOB: 11/07/1934  Reason for Encounter: Disease State  Patient Questions:  1.  Have you seen any other providers since your last visit? No  2.  Any changes in your medicines or health? No    PCP : [name], MD  Allergies:   Allergies  Allergen Reactions  . Amlodipine Other (See Comments)    transaminitis    Medications: Outpatient Encounter Medications as of 08/11/2020  Medication Sig  . atorvastatin (LIPITOR) 10 MG tablet Take 0.5 tablets (5 mg total) by mouth daily.  . carvedilol (COREG) 3.125 MG tablet Take 1 tablet (3.125 mg total) by mouth 2 (two) times daily.  . famotidine (PEPCID) 20 MG tablet TAKE 1 TABLET BY MOUTH EVERYDAY AT BEDTIME  . ferrous sulfate 325 (65 FE) MG tablet Take 1 tablet (325 mg total) by mouth every other day.  . finasteride (PROSCAR) 5 MG tablet Take 1 tablet (5 mg total) by mouth every evening.  Marland Kitchen levothyroxine (SYNTHROID) 25 MCG tablet TAKE 1 TABLET BY MOUTH  DAILY BEFORE BREAKFAST  . metFORMIN (GLUCOPHAGE-XR) 500 MG 24 hr tablet Take 2 tablets (1,000 mg total) by mouth 2 (two) times daily.  . Multiple Vitamin (MULTIVITAMIN WITH MINERALS) TABS Take 1 tablet by mouth daily.  Marland Kitchen omeprazole (PRILOSEC) 40 MG capsule Take 1 capsule (40 mg total) by mouth daily.  . polyethylene glycol powder (GLYCOLAX/MIRALAX) 17 GM/SCOOP powder Take 8.5 g by mouth daily as needed for moderate constipation. Hold for diarrhea  . quinapril (ACCUPRIL) 20 MG tablet TAKE 1 TABLET BY MOUTH AT  BEDTIME  . quinapril-hydrochlorothiazide (ACCURETIC) 20-25 MG tablet Take 1 tablet by mouth daily.  . sertraline (ZOLOFT) 50 MG tablet Take 1 tablet (50 mg total) by mouth daily.   No facility-administered encounter medications on file as of 08/11/2020.    Current Diagnosis: Patient Active Problem List   Diagnosis Date Noted  . Hoarseness 05/10/2020  . Constipation 02/28/2020  . Carotid  stenosis 09/12/2019  . Interstitial lung disease ([place]) 09/09/2019  . Dysphagia 09/06/2019  . Nausea 09/06/2019  . Bilateral leg edema 02/09/2019  . Iron deficiency anemia 08/30/2017  . Borderline hypothyroidism 08/30/2017  . Kidney cyst, acquired 05/26/2017  . Chronic lower back pain 06/24/2016  . Serous otitis media with rupture of tympanic membrane 03/28/2016  . Abdominal aortic atherosclerosis ([place]) 06/05/2014  . Advanced care planning/counseling discussion 06/02/2014  . Alcoholic hepatitis without ascites 06/02/2014  . Health maintenance examination 06/02/2014  . Medicare annual wellness visit, subsequent 06/01/2013  . Weight loss 01/11/2013  . Transaminitis 12/01/2012  . Right shoulder pain 11/30/2012  . Schwannoma of spinal cord ([place]) 11/26/2012  . DIVERTICULOSIS OF COLON 12/22/2008  . Hyperlipidemia associated with type 2 diabetes mellitus ([place]) 01/10/2007  . Alcohol use disorder, severe, in sustained remission ([place]) 01/10/2007  . Ex-smoker 01/10/2007  . MDD (major depressive disorder), recurrent episode, moderate ([place]) 01/10/2007  . COPD (chronic obstructive pulmonary disease) ([place]) 01/10/2007  . Benign prostatic hyperplasia 01/10/2007  . Type 2 diabetes mellitus with other specified complication ([place]) 33/29/5188  . Essential hypertension 01/09/2007   Left message to return call 08/11/20, 08/14/20, 08/15/20, 08/16/20.   Mr. Squier left a voicemail regarding his blood pressure readings that he felt were too low. Attempted to contact patient multiple times to review readings and symptoms. Unable to reach patient.    Follow-Up:  Pharmacist Review   Debbora Dus, CPP notified  Margaretmary Dys,  [place] Assistant [phone]

## 2020-08-21 ENCOUNTER — Telehealth: Payer: Self-pay

## 2020-08-21 NOTE — Chronic Care Management (AMB) (Addendum)
Chronic Care Management Pharmacy Assistant   Name: SHAUN RUNYON  MRN: [id number] DOB: 18-Aug-1934  Reason for Encounter: Medication Review-Medication Adherence and Delivery Coordination  Patient Questions:  1.  Have you seen any other providers since your last visit? No  2.  Any changes in your medicines or health? No - Carvedilol started December 2021    PCP : [name], MD  Allergies:   Allergies  Allergen Reactions   Amlodipine Other (See Comments)    transaminitis    Medications: Outpatient Encounter Medications as of 08/21/2020  Medication Sig   atorvastatin (LIPITOR) 10 MG tablet Take 0.5 tablets (5 mg total) by mouth daily.   carvedilol (COREG) 3.125 MG tablet Take 1 tablet (3.125 mg total) by mouth 2 (two) times daily.   famotidine (PEPCID) 20 MG tablet TAKE 1 TABLET BY MOUTH EVERYDAY AT BEDTIME   ferrous sulfate 325 (65 FE) MG tablet Take 1 tablet (325 mg total) by mouth every other day.   finasteride (PROSCAR) 5 MG tablet Take 1 tablet (5 mg total) by mouth every evening.   levothyroxine (SYNTHROID) 25 MCG tablet TAKE 1 TABLET BY MOUTH  DAILY BEFORE BREAKFAST   metFORMIN (GLUCOPHAGE-XR) 500 MG 24 hr tablet Take 2 tablets (1,000 mg total) by mouth 2 (two) times daily.   Multiple Vitamin (MULTIVITAMIN WITH MINERALS) TABS Take 1 tablet by mouth daily.   omeprazole (PRILOSEC) 40 MG capsule Take 1 capsule (40 mg total) by mouth daily.   polyethylene glycol powder (GLYCOLAX/MIRALAX) 17 GM/SCOOP powder Take 8.5 g by mouth daily as needed for moderate constipation. Hold for diarrhea   quinapril (ACCUPRIL) 20 MG tablet TAKE 1 TABLET BY MOUTH AT  BEDTIME   quinapril-hydrochlorothiazide (ACCURETIC) 20-25 MG tablet Take 1 tablet by mouth daily.   sertraline (ZOLOFT) 50 MG tablet Take 1 tablet (50 mg total) by mouth daily.   No facility-administered encounter medications on file as of 08/21/2020.    Current Diagnosis: Patient Active Problem List   Diagnosis Date  Noted   Hoarseness 05/10/2020   Constipation 02/28/2020   Carotid stenosis 09/12/2019   Interstitial lung disease ([place]) 09/09/2019   Dysphagia 09/06/2019   Nausea 09/06/2019   Bilateral leg edema 02/09/2019   Iron deficiency anemia 08/30/2017   Borderline hypothyroidism 08/30/2017   Kidney cyst, acquired 05/26/2017   Chronic lower back pain 06/24/2016   Serous otitis media with rupture of tympanic membrane 03/28/2016   Abdominal aortic atherosclerosis ([place]) 06/05/2014   Advanced care planning/counseling discussion 69/48/5462   Alcoholic hepatitis without ascites 06/02/2014   Health maintenance examination 06/02/2014   Medicare annual wellness visit, subsequent 06/01/2013   Weight loss 01/11/2013   Transaminitis 12/01/2012   Right shoulder pain 11/30/2012   Schwannoma of spinal cord ([place]) 11/26/2012   DIVERTICULOSIS OF COLON 12/22/2008   Hyperlipidemia associated with type 2 diabetes mellitus ([place]) 01/10/2007   Alcohol use disorder, severe, in sustained remission ([place]) 01/10/2007   Ex-smoker 01/10/2007   MDD (major depressive disorder), recurrent episode, moderate ([place]) 01/10/2007   COPD (chronic obstructive pulmonary disease) ([place]) 01/10/2007   Benign prostatic hyperplasia 01/10/2007   Type 2 diabetes mellitus with other specified complication ([place]) 70/35/0093   Essential hypertension 01/09/2007   Reviewed chart for medication changes ahead of medication coordination call.  No OVs, Consults, or hospital visits since last care coordination call/Pharmacist visit.   No medication changes indicated.  BP Readings from Last 3 Encounters:  05/10/20 (!) 150/66  03/22/20 (!) 150/60  02/28/20 (!) 154/62  Lab Results  Component Value Date   HGBA1C 6.3 (A) 05/10/2020     Patient obtains medications through Adherence Packaging  30 Days   Last adherence delivery included: 08/01/2020 Atorvastatin 10 mg - 1/2 tablet daily (evening meal)  Carvedilol 3.125 1 tablet twice daily  (breakfast and dinner)  Famotidine 20 mg- 1 tablet daily (bedtime)   Levothyroxine 25 mcg- 1 tablet daily (before breakfast)   Metformin ER 500 mg- 2 tablets twice daily (breakfast and evening meal)  Omeprazole 40 mg- 1 tablet daily (breakfast)  Quinapril/HCTZ 20/25 mg - 1 tablet daily (breakfast)  Quinapril 20 mg - 1 tablet daily (evening meal)   Sertraline 50 mg - 1 tablet daily (breakfast)  Patient declined the following medications last month:  No adherence medications  Ferrous Sulfate 325mg  getting OTC   Finasteride 5 mg 1 tablet daily (evening meal) per patient- Ok until February. Had 93 left 06/06/20  Mens multivitamin getting OTC   Polyethylene glycol getting OTC   Patient is due for next adherence delivery on: 08/28/20.  Called patient and reviewed medications and coordinated delivery.  This delivery to include: Atorvastatin 10 mg - 1/2 tablet daily (evening meal)  Carvedilol 3.125 1 tablet twice daily (breakfast and dinner)  Famotidine 20 mg- 1 tablet daily (bedtime)   Finasteride 5 mg 1 tablet daily (evening meal) Levothyroxine 25 mcg- 1 tablet daily (before breakfast)   Metformin ER 500 mg- 2 tablets twice daily (breakfast and evening meal)  Omeprazole 40 mg- 1 tablet daily (breakfast)  Quinapril/HCTZ 20/25 mg - 1 tablet daily (breakfast)  Quinapril 20 mg - 1 tablet daily (evening meal)   Sertraline 50 mg - 1 tablet daily (breakfast)  Patient will not need a short or acute fill, prior to adherence delivery.   Patient declined the following medications Ferrous Sulfate 325mg  every other day getting OTC  Mens multivitamin getting OTC  Polyethylene glycol getting OTC  Patient needs refills for no medications.  Confirmed delivery date of 08/28/20, advised patient that pharmacy will contact them the morning of delivery.  Patient had called earlier in the month stating his blood pressure was a little low. He states he does not check blood pressure at a specific time each  day. Encouraged him to pick a time and check around that same time daily. Patient denies feeling lethargic. He does note feeling light headed at times but states this is not unusual for him and he just takes his time standing up and turning. Blood pressure readings are as follows:  08/20/20- 121/53  08/13/20- 130/40 08/19/20-    ---   08/12/20 124/53 08/18/20- 140/55  08/11/20 127/57 08/17/20- 141/62  08/10/20 129/55 08/16/20- 160/58  08/09/20 147/63 08/15/20- 133/54  08/08/20- 179/77 08/14/20 124/55  Patient denies checking blood sugar recently. States he feels like it will be high after eating sweets over the holidays. Patient recently visited his daughter out of state for a couple of weeks. Encouraged him to resume checking blood sugar.  Follow-Up:  Coordination of Enhanced Pharmacy Services and Pharmacist Review   Debbora Dus, CPP notified  Margaretmary Dys, [place] [phone]  Overall, BP is much better since starting carvedilol. His DBP was running < 60 prior to starting carvedilol. Agree check BP at same time each day for consistency. Also ensure he takes medications at same times each day. Did not provide HR today but these have been running 55-70 per last month call.  Debbora Dus, PharmD Clinical Pharmacist [facility]  [place] [phone]

## 2020-09-08 ENCOUNTER — Ambulatory Visit (INDEPENDENT_AMBULATORY_CARE_PROVIDER_SITE_OTHER): Payer: Medicare Other

## 2020-09-08 ENCOUNTER — Other Ambulatory Visit: Payer: Self-pay

## 2020-09-08 VITALS — BP 159/76 | HR 53

## 2020-09-08 DIAGNOSIS — Z0000 Encounter for general adult medical examination without abnormal findings: Secondary | ICD-10-CM | POA: Diagnosis not present

## 2020-09-08 NOTE — Progress Notes (Signed)
PCP notes:  Health Maintenance: Tdap- insurance   Abnormal Screenings: none   Patient concerns: none   Nurse concerns: none   Next PCP appt.: 09/19/2020 @ 10 am

## 2020-09-08 NOTE — Patient Instructions (Signed)
[name] , Thank you for taking time to come for your Medicare Wellness Visit. I appreciate your ongoing commitment to your health goals. Please review the following plan we discussed and let me know if I can assist you in the future.   Screening recommendations/referrals: Colonoscopy: no longer required  Recommended yearly ophthalmology/optometry visit for glaucoma screening and checkup Recommended yearly dental visit for hygiene and checkup  Vaccinations: Influenza vaccine: Up to date, completed 05/10/2020, due 02/2021 Pneumococcal vaccine: Completed series Tdap vaccine: decline-insurance Shingles vaccine: Completed series   Covid-19: Completed series  Advanced directives: Please bring a copy of your POA (Power of Attorney) and/or Living Will to your next appointment.   Conditions/risks identified: diabetes, hyperlipidemia   Next appointment: Follow up in one year for your annual wellness visit.   Preventive Care 41 Years and Older, Male Preventive care refers to lifestyle choices and visits with your health care provider that can promote health and wellness. What does preventive care include?  A yearly physical exam. This is also called an annual well check.  Dental exams once or twice a year.  Routine eye exams. Ask your health care provider how often you should have your eyes checked.  Personal lifestyle choices, including:  Daily care of your teeth and gums.  Regular physical activity.  Eating a healthy diet.  Avoiding tobacco and drug use.  Limiting alcohol use.  Practicing safe sex.  Taking low doses of aspirin every day.  Taking vitamin and mineral supplements as recommended by your health care provider. What happens during an annual well check? The services and screenings done by your health care provider during your annual well check will depend on your age, overall health, lifestyle risk factors, and family history of disease. Counseling  Your health  care provider may ask you questions about your:  Alcohol use.  Tobacco use.  Drug use.  Emotional well-being.  Home and relationship well-being.  Sexual activity.  Eating habits.  History of falls.  Memory and ability to understand (cognition).  Work and work Statistician. Screening  You may have the following tests or measurements:  Height, weight, and BMI.  Blood pressure.  Lipid and cholesterol levels. These may be checked every 5 years, or more frequently if you are over 12 years old.  Skin check.  Lung cancer screening. You may have this screening every year starting at age 66 if you have a 30-pack-year history of smoking and currently smoke or have quit within the past 15 years.  Fecal occult blood test (FOBT) of the stool. You may have this test every year starting at age 36.  Flexible sigmoidoscopy or colonoscopy. You may have a sigmoidoscopy every 5 years or a colonoscopy every 10 years starting at age 54.  Prostate cancer screening. Recommendations will vary depending on your family history and other risks.  Hepatitis C blood test.  Hepatitis B blood test.  Sexually transmitted disease (STD) testing.  Diabetes screening. This is done by checking your blood sugar (glucose) after you have not eaten for a while (fasting). You may have this done every 1-3 years.  Abdominal aortic aneurysm (AAA) screening. You may need this if you are a current or former smoker.  Osteoporosis. You may be screened starting at age 42 if you are at high risk. Talk with your health care provider about your test results, treatment options, and if necessary, the need for more tests. Vaccines  Your health care provider may recommend certain vaccines, such as:  Influenza vaccine. This is recommended every year.  Tetanus, diphtheria, and acellular pertussis (Tdap, Td) vaccine. You may need a Td booster every 10 years.  Zoster vaccine. You may need this after age  7.  Pneumococcal 13-valent conjugate (PCV13) vaccine. One dose is recommended after age 54.  Pneumococcal polysaccharide (PPSV23) vaccine. One dose is recommended after age 41. Talk to your health care provider about which screenings and vaccines you need and how often you need them. This information is not intended to replace advice given to you by your health care provider. Make sure you discuss any questions you have with your health care provider. Document Released: 08/11/2015 Document Revised: 04/03/2016 Document Reviewed: 05/16/2015 Elsevier Interactive Patient Education  2017 [place] Prevention in the Home Falls can cause injuries. They can happen to people of all ages. There are many things you can do to make your home safe and to help prevent falls. What can I do on the outside of my home?  Regularly fix the edges of walkways and driveways and fix any cracks.  Remove anything that might make you trip as you walk through a door, such as a raised step or threshold.  Trim any bushes or trees on the path to your home.  Use bright outdoor lighting.  Clear any walking paths of anything that might make someone trip, such as rocks or tools.  Regularly check to see if handrails are loose or broken. Make sure that both sides of any steps have handrails.  Any raised decks and porches should have guardrails on the edges.  Have any leaves, snow, or ice cleared regularly.  Use sand or salt on walking paths during winter.  Clean up any spills in your garage right away. This includes oil or grease spills. What can I do in the bathroom?  Use night lights.  Install grab bars by the toilet and in the tub and shower. Do not use towel bars as grab bars.  Use non-skid mats or decals in the tub or shower.  If you need to sit down in the shower, use a plastic, non-slip stool.  Keep the floor dry. Clean up any water that spills on the floor as soon as it happens.  Remove  soap buildup in the tub or shower regularly.  Attach bath mats securely with double-sided non-slip rug tape.  Do not have throw rugs and other things on the floor that can make you trip. What can I do in the bedroom?  Use night lights.  Make sure that you have a light by your bed that is easy to reach.  Do not use any sheets or blankets that are too big for your bed. They should not hang down onto the floor.  Have a firm chair that has side arms. You can use this for support while you get dressed.  Do not have throw rugs and other things on the floor that can make you trip. What can I do in the kitchen?  Clean up any spills right away.  Avoid walking on wet floors.  Keep items that you use a lot in easy-to-reach places.  If you need to reach something above you, use a strong step stool that has a grab bar.  Keep electrical cords out of the way.  Do not use floor polish or wax that makes floors slippery. If you must use wax, use non-skid floor wax.  Do not have throw rugs and other things on the floor that  can make you trip. What can I do with my stairs?  Do not leave any items on the stairs.  Make sure that there are handrails on both sides of the stairs and use them. Fix handrails that are broken or loose. Make sure that handrails are as long as the stairways.  Check any carpeting to make sure that it is firmly attached to the stairs. Fix any carpet that is loose or worn.  Avoid having throw rugs at the top or bottom of the stairs. If you do have throw rugs, attach them to the floor with carpet tape.  Make sure that you have a light switch at the top of the stairs and the bottom of the stairs. If you do not have them, ask someone to add them for you. What else can I do to help prevent falls?  Wear shoes that:  Do not have high heels.  Have rubber bottoms.  Are comfortable and fit you well.  Are closed at the toe. Do not wear sandals.  If you use a  stepladder:  Make sure that it is fully opened. Do not climb a closed stepladder.  Make sure that both sides of the stepladder are locked into place.  Ask someone to hold it for you, if possible.  Clearly mark and make sure that you can see:  Any grab bars or handrails.  First and last steps.  Where the edge of each step is.  Use tools that help you move around (mobility aids) if they are needed. These include:  Canes.  Walkers.  Scooters.  Crutches.  Turn on the lights when you go into a dark area. Replace any light bulbs as soon as they burn out.  Set up your furniture so you have a clear path. Avoid moving your furniture around.  If any of your floors are uneven, fix them.  If there are any pets around you, be aware of where they are.  Review your medicines with your doctor. Some medicines can make you feel dizzy. This can increase your chance of falling. Ask your doctor what other things that you can do to help prevent falls. This information is not intended to replace advice given to you by your health care provider. Make sure you discuss any questions you have with your health care provider. Document Released: 05/11/2009 Document Revised: 12/21/2015 Document Reviewed: 08/19/2014 Elsevier Interactive Patient Education  2017 [employer].

## 2020-09-08 NOTE — Progress Notes (Signed)
Subjective:   Justin Benson is a 86 y.o. male who presents for Medicare Annual/Subsequent preventive examination.  Review of Systems: N/A      I connected with the patient today by telephone and verified that I am speaking with the correct person using two identifiers. Location patient: home Location nurse: work Persons participating in the telephone visit: patient, nurse.   I discussed the limitations, risks, security and privacy concerns of performing an evaluation and management service by telephone and the availability of in person appointments. I also discussed with the patient that there may be a patient responsible charge related to this service. The patient expressed understanding and verbally consented to this telephonic visit.        Cardiac Risk Factors include: advanced age (>69men, >55 women);male gender;diabetes mellitus;Other (see comment), Risk factor comments: hyperlipidemia     Objective:    Today's Vitals   09/08/20 1101  BP: (!) 159/76  Pulse: (!) 53   There is no height or weight on file to calculate BMI.  Advanced Directives 08/28/2018 08/20/2017 06/10/2016 02/28/2014 02/26/2013 01/21/2013 12/16/2012  Does Patient Have a Medical Advance Directive? Yes Yes Yes Patient has advance directive, copy not in chart Patient has advance directive, copy not in chart Patient has advance directive, copy not in chart Patient has advance directive, copy not in chart  Type of Advance Directive [place];Living will [place];Living will [place];Living will - [place];Living will [place];Living will [place];Living will  Does patient want to make changes to medical advance directive? - - No - Patient declined - - - -  Copy of [place] in Chart? Yes - validated most recent copy scanned in chart (See row information) Yes Yes - Copy requested from  family - Copy requested from family  Pre-existing out of facility DNR order (yellow form or pink MOST form) - - - - - No -    Current Medications (verified) Outpatient Encounter Medications as of 09/08/2020  Medication Sig  . atorvastatin (LIPITOR) 10 MG tablet Take 0.5 tablets (5 mg total) by mouth daily.  . carvedilol (COREG) 3.125 MG tablet Take 1 tablet (3.125 mg total) by mouth 2 (two) times daily.  . famotidine (PEPCID) 20 MG tablet TAKE 1 TABLET BY MOUTH EVERYDAY AT BEDTIME  . ferrous sulfate 325 (65 FE) MG tablet Take 1 tablet (325 mg total) by mouth every other day.  . finasteride (PROSCAR) 5 MG tablet Take 1 tablet (5 mg total) by mouth every evening.  Marland Kitchen levothyroxine (SYNTHROID) 25 MCG tablet TAKE 1 TABLET BY MOUTH  DAILY BEFORE BREAKFAST  . metFORMIN (GLUCOPHAGE-XR) 500 MG 24 hr tablet Take 2 tablets (1,000 mg total) by mouth 2 (two) times daily.  . Multiple Vitamin (MULTIVITAMIN WITH MINERALS) TABS Take 1 tablet by mouth daily.  Marland Kitchen omeprazole (PRILOSEC) 40 MG capsule Take 1 capsule (40 mg total) by mouth daily.  . polyethylene glycol powder (GLYCOLAX/MIRALAX) 17 GM/SCOOP powder Take 8.5 g by mouth daily as needed for moderate constipation. Hold for diarrhea  . quinapril (ACCUPRIL) 20 MG tablet TAKE 1 TABLET BY MOUTH AT  BEDTIME  . quinapril-hydrochlorothiazide (ACCURETIC) 20-25 MG tablet Take 1 tablet by mouth daily.  . sertraline (ZOLOFT) 50 MG tablet Take 1 tablet (50 mg total) by mouth daily.   No facility-administered encounter medications on file as of 09/08/2020.    Allergies (verified) Amlodipine   History: Past  Medical History:  Diagnosis Date  . AAA (abdominal aortic aneurysm) ([place]) 05/2012   mild 2.3 cm , rpt 1 year  . Alcoholic hepatitis without ascites 06/02/2014   Normal viral hep panel, normal abd [place] (2015)  . Anemia   . Anxiety   . Arthritis    fingers and hip  . BCC (basal cell carcinoma of skin) 11/2018   L leg ([place])  . BPH (benign prostatic  hypertrophy)   . Choledocholithiasis 11/2012   s/p ERCP, s/p lap chole  . COPD (chronic obstructive pulmonary disease) ([place])   . Depression   . Diabetes mellitus 4/02  . GERD (gastroesophageal reflux disease)   . Hyperlipidemia   . Hypertension 1996  . Pneumonia as a baby  . 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 Schwannoma of spinal cord ([place]) 11/2012, 09/2015   medial to R psoas muscle, 4cm size  . Squamous cell carcinoma of multiple sites 09/2015   finger, L temple, R forearm - [place] [facility]  . Substance abuse ([place])    I probably drink too much, dr, told me to cut down   Past Surgical History:  Procedure Laterality Date  . BASAL CELL CARCINOMA EXCISION Right 05/2017   right ear; Dr. Elvera [place]ox, [facility]  . CHOLECYSTECTOMY N/A 03/08/2013   Procedure: LAPAROSCOPIC CHOLECYSTECTOMY WITH INTRAOPERATIVE CHOLANGIOGRAM;  Surgeon: Harl Bowie, MD;  Location: [facility];  Service: General;  Laterality: N/A;  . CIRCUMCISION  10/30/09   Dr Gaynelle Arabian  . COLONOSCOPY  11/2008   2 small polyps (1 tubular), diverticulosis, lipoma, int/ext hemorrhoids [name])  . COLONOSCOPY  02/2014   1 polyp, diverticulosis, no rpt needed [name])  . COLONOSCOPY  01/2018   1 TA [name])  . ENDOSCOPIC RETROGRADE CHOLANGIOPANCREATOGRAPHY (ERCP) WITH PROPOFOL N/A 12/24/2012   Procedure: ENDOSCOPIC RETROGRADE CHOLANGIOPANCREATOGRAPHY (ERCP) WITH PROPOFOL;  Surgeon: Milus Banister, MD;  Location: [facility];  Service: Endoscopy;  Laterality: N/A;  . ESOPHAGOGASTRODUODENOSCOPY  01/2018   friable duodenal mucosa, mild gastritis s/p benign biopsies [name])  . LAPAROSCOPIC CHOLECYSTECTOMY  02/2013   Dr. Ninfa Linden   Family History  Problem Relation Age of Onset  . Prostate cancer Father   . Aneurysm Father        aortic  . Stroke Father   . Cancer Sister        bladder (nonsmoker)  . Hodgkin's lymphoma Brother 46  . Prostate cancer Brother 58       1/2  . Prostate cancer Brother 18       1/2  . Other Sister         cerebral hemm  . Lupus Sister   . Diabetes Brother   . Colon cancer Neg Hx    Social History   Socioeconomic History  . Marital status: Single    Spouse name: Not on file  . Number of children: 1  . Years of education: Not on file  . Highest education level: Not on file  Occupational History  . Occupation: Chartered certified accountant: retired    Comment: retired 1999  Tobacco Use  . Smoking status: Former Smoker    Packs/day: 2.00    Years: 30.00    Pack years: 60.00    Types: Cigarettes    Start date: 07/29/1962    Quit date: 07/29/1992    Years since quitting: 28.1  . Smokeless tobacco: Never Used  Vaping Use  . Vaping Use: Never used  Substance and Sexual Activity  . Alcohol use:  Not Currently  . Drug use: No  . Sexual activity: Yes  Other Topics Concern  . 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   Social Determinants of Health   Financial Resource Strain: Low Risk   . Difficulty of Paying Living Expenses: Not hard at all  Food Insecurity: No Food Insecurity  . Worried About Charity fundraiser in the Last Year: Never true  . Ran Out of Food in the Last Year: Never true  Transportation Needs: No Transportation Needs  . Lack of Transportation (Medical): No  . Lack of Transportation (Non-Medical): No  Physical Activity: Sufficiently Active  . Days of Exercise per Week: 7 days  . Minutes of Exercise per Session: 30 min  Stress: Stress Concern Present  . Feeling of Stress : To some extent  Social Connections: Not on file    Tobacco Counseling Counseling given: Not Answered   Clinical Intake:  Pre-visit preparation  completed: Yes  Pain : 0-10 Pain Type: Chronic pain Pain Location: Hip Pain Orientation: Right Pain Descriptors / Indicators: Aching Pain Onset: More than a month ago Pain Frequency: Intermittent Pain Relieving Factors: pillow between the knees  Pain Relieving Factors: pillow between the knees  Nutritional Risks: None Diabetes: Yes CBG done?: No Did pt. bring in CBG monitor from home?: No  How often do you need to have someone help you when you read instructions, pamphlets, or other written materials from your doctor or pharmacy?: 1 - Never  Diabetic: Yes Nutrition Risk Assessment:  Has the patient had any N/V/D within the last 2 months?  No  Does the patient have any non-healing wounds?  No  Has the patient had any unintentional weight loss or weight gain?  No   Diabetes:  Is the patient diabetic?  Yes  If diabetic, was a CBG obtained today?  No  telephone visit  Did the patient bring in their glucometer from home?  No  telephone visit  How often do you monitor your CBG's? When needed.   Financial Strains and Diabetes Management:  Are you having any financial strains with the device, your supplies or your medication? No .  Does the patient want to be seen by Chronic Care Management for management of their diabetes?  No  Would the patient like to be referred to a Nutritionist or for Diabetic Management?  No   Diabetic Exams:  Diabetic Eye Exam: Completed 03/21/2020 Diabetic Foot Exam: Completed 05/10/2020   Interpreter Needed?: No  Information entered by :: CJohnson, LPN   Activities of Daily Living In your present state of health, do you have any difficulty performing the following activities: 09/08/2020  Hearing? Y  Comment some hearing loss noted  Vision? N  Difficulty concentrating or making decisions? Y  Comment some trouble remembering names, places, people, etc  Walking or climbing stairs? N  Dressing or bathing? N  Doing errands, shopping? N   Preparing Food and eating ? N  Using the Toilet? N  In the past six months, have you accidently leaked urine? Y  Comment wears liners  Do you have problems with loss of bowel control? N  Managing your Medications?  N  Managing your Finances? N  Housekeeping or managing your Housekeeping? N  Some recent data might be hidden    Patient Care Team: [name], MD as PCP - General (Family Medicine) Melida Quitter, MD as Consulting Physician (Otolaryngology) Harriett Sine, MD as Consulting Physician (Dermatology) Fanny Dance, DDS as Referring Physician (Dentistry) Debbora Dus, [employer] as Pharmacist (Pharmacist)  Indicate any recent Medical Services you may have received from other than [facility] providers in the past year (date may be approximate).     Assessment:   This is a routine wellness examination for [place].  Hearing/Vision screen  Hearing Screening   125Hz  250Hz  500Hz  1000Hz  2000Hz  3000Hz  4000Hz  6000Hz  8000Hz   Right ear:           Left ear:           Vision Screening Comments: Patient gets annual eye exams   Dietary issues and exercise activities discussed: Current Exercise Habits: Home exercise routine, Type of exercise: walking, Time (Minutes): 30, Frequency (Times/Week): 7, Weekly Exercise (Minutes/Week): 210, Intensity: Moderate, Exercise limited by: None identified  Goals    . Follow up with Primary Care Provider     Starting 08/28/2018, I will continue to take medications as prescribed and to keep appointments with PCP as scheduled.     . Patient Stated     09/08/2020, I will continue to walk daily for about 30 minutes.    . Pharmacy Care Plan     CARE PLAN ENTRY  Current Barriers:  . Chronic Disease Management support, education, and care coordination needs related to Hypertension and Diabetes   Hypertension BP Readings from Last 3 Encounters:  05/10/20 (!) 150/66  03/22/20 (!) 150/60  02/28/20 (!) 154/62 .  Home blood pressure monitoring: twice  daily - ranging 130s-170s/50-60s . Pharmacist Clinical Goal(s): o Over the next 3 months, patient will work with PharmD and providers to achieve BP goal <140/90 mmHg . Current regimen:   Quinapril 20 mg/HCTZ 25 mg - 1 tablet daily  Quinapril 20 mg - 1 tablet daily . Interventions: o Reviewed home blood pressure monitoring. Blood pressures consistently elevated since stopping amlodipine.  o Consider starting new medication - consult PCP . Patient self care activities - Over the next 3 months, patient will: o Continue current medications as prescribed until further notice o Continue monitoring blood pressure and heart rate daily   Diabetes Lab Results  Component Value Date/Time   HGBA1C 6.3 (A) 05/10/2020 09:51 AM   HGBA1C 7.4 (H) 09/02/2019 09:03 AM   HGBA1C 7.3 (H) 05/07/2019 11:46 AM .  Pharmacist Clinical Goal(s): o Over the next 3 months, patient will work with PharmD and providers to achieve A1c goal <7% . Current regimen:  o Metformin 500 mg XR - 2 tablets twice daily with meals  . Interventions: o Reviewed blood sugar readings. Fasting readings range: 130-140s . Patient self care activities - Over the next 3 months, patient will: o Continue to check blood sugar daily. o Contact provider with any episodes of hypoglycemia (blood glucose < 70)   Please see past updates related to this goal by clicking on the "Past Updates" button in the selected goal       Depression Screen PHQ 2/9 Scores 09/08/2020 09/06/2019 08/28/2018 08/20/2017 06/10/2016 06/07/2015 06/02/2014  PHQ - 2 Score 4 1 4 2 2 2  0  PHQ- 9 Score 4 4 16 4 6 2  -    Fall Risk Fall Risk  09/08/2020 09/06/2019 08/28/2018 08/20/2017 06/10/2016  Falls  in the past year? 1 0 0 No No  Comment - - - - -  Number falls in past yr: 0 - - - -  Injury with Fall? 1 - - - -  Comment hurt shoulder - - - -  Risk for fall due to : Medication side effect;Impaired balance/gait - - - -  Risk for fall due to: Comment - - - - -  Follow up  Falls evaluation completed;Falls prevention discussed - - - -    FALL RISK PREVENTION PERTAINING TO THE HOME:  Any stairs in or around the home? Yes  If so, are there any without handrails? No  Home free of loose throw rugs in walkways, pet beds, electrical cords, etc? Yes  Adequate lighting in your home to reduce risk of falls? Yes   ASSISTIVE DEVICES UTILIZED TO PREVENT FALLS:  Life alert? No  Use of a cane, walker or w/c? No  Grab bars in the bathroom? No  Shower chair or bench in shower? No  Elevated toilet seat or a handicapped toilet? No   TIMED UP AND GO:  Was the test performed? N/A telephone visit .   Cognitive Function: MMSE - Mini Mental State Exam 09/08/2020 08/28/2018 08/20/2017 06/10/2016  Orientation to time 5 5 5 5   Orientation to Place 5 5 5 5   Registration 3 3 3 3   Attention/ Calculation 5 0 0 0  Recall 3 3 3 3   Language- name 2 objects - 0 0 0  Language- repeat 1 1 1 1   Language- follow 3 step command - 3 3 3   Language- read & follow direction - 0 0 0  Write a sentence - 0 0 0  Copy design - 0 0 0  Total score - 20 20 20   Mini Cog  Mini-Cog screen was completed. Maximum score is 22. A value of 0 denotes this part of the MMSE was not completed or the patient failed this part of the Mini-Cog screening.       Immunizations Immunization History  Administered Date(s) Administered  . Fluad Quad(high Dose 65+) 05/07/2019, 05/10/2020  . Influenza Split 05/08/2011, 05/29/2012  . Influenza Whole 04/28/2006, 06/17/2007, 05/15/2008, 05/07/2010  . Influenza,inj,Quad PF,6+ Mos 06/02/2014, 06/07/2015  . Influenza,inj,quad, With Preservative 04/23/2017, 05/01/2018  . Influenza-Unspecified 05/17/2013, 04/15/2016  . PFIZER(Purple Top)SARS-COV-2 Vaccination 08/13/2019, 09/02/2019, 04/28/2020  . Pneumococcal Conjugate-13 06/02/2014  . Pneumococcal Polysaccharide-23 06/29/2003  . Td 12/28/1998, 08/28/2009  . Zoster 04/03/2011  . Zoster Recombinat (Shingrix)  11/06/2017, 02/25/2018    TDAP status: Due, Education has been provided regarding the importance of this vaccine. Advised may receive this vaccine at local pharmacy or Health Dept. Aware to provide a copy of the vaccination record if obtained from local pharmacy or Health Dept. Verbalized acceptance and understanding.  Flu Vaccine status: Up to date  Pneumococcal vaccine status: Up to date  Covid-19 vaccine status: Completed vaccines  Qualifies for Shingles Vaccine? Yes   Zostavax completed Yes  Shingrix Completed?: Yes  Screening Tests Health Maintenance  Topic Date Due  . TETANUS/TDAP  08/29/2019  . HEMOGLOBIN A1C  11/08/2020  . OPHTHALMOLOGY EXAM  03/21/2021  . FOOT EXAM  05/10/2021  . INFLUENZA VACCINE  Completed  . COVID-19 Vaccine  Completed  . PNA vac Low Risk Adult  Completed    Health Maintenance  Health Maintenance Due  Topic Date Due  . TETANUS/TDAP  08/29/2019    Colorectal cancer screening: No longer required.   Lung Cancer Screening: (Low Dose  CT Chest recommended if Age 45-80 years, 30 pack-year currently smoking OR have quit w/in 15 years.) does not qualify.    Additional Screening:  Hepatitis C Screening: does not qualify; Completed N/A  Vision Screening: Recommended annual ophthalmology exams for early detection of glaucoma and other disorders of the eye. Is the patient up to date with their annual eye exam?  Yes  Who is the provider or what is the name of the office in which the patient attends annual eye exams? [facility], Dr. Cristela Felt If pt is not established with a provider, would they like to be referred to a provider to establish care? No .   Dental Screening: Recommended annual dental exams for proper oral hygiene  Community Resource Referral / Chronic Care Management: CRR required this visit?  No   CCM required this visit?  No      Plan:     I have personally reviewed and noted the following in the patient's chart:    . Medical and social history . Use of alcohol, tobacco or illicit drugs  . Current medications and supplements . Functional ability and status . Nutritional status . Physical activity . Advanced directives . List of other physicians . Hospitalizations, surgeries, and ER visits in previous 12 months . Vitals . Screenings to include cognitive, depression, and falls . Referrals and appointments  In addition, I have reviewed and discussed with patient certain preventive protocols, quality metrics, and best practice recommendations. A written personalized care plan for preventive services as well as general preventive health recommendations were provided to patient.   Due to this being a telephonic visit, the after visit summary with patients personalized plan was offered to patient via office or my-chart. Patient preferred to pick up at office at next visit or via mychart.   Andrez Grime, LPN   [id number]

## 2020-09-10 ENCOUNTER — Other Ambulatory Visit: Payer: Self-pay | Admitting: Family Medicine

## 2020-09-10 DIAGNOSIS — N400 Enlarged prostate without lower urinary tract symptoms: Secondary | ICD-10-CM

## 2020-09-10 DIAGNOSIS — D500 Iron deficiency anemia secondary to blood loss (chronic): Secondary | ICD-10-CM

## 2020-09-10 DIAGNOSIS — E039 Hypothyroidism, unspecified: Secondary | ICD-10-CM

## 2020-09-10 DIAGNOSIS — E1169 Type 2 diabetes mellitus with other specified complication: Secondary | ICD-10-CM

## 2020-09-11 ENCOUNTER — Ambulatory Visit: Payer: Medicare Other

## 2020-09-12 ENCOUNTER — Other Ambulatory Visit: Payer: Self-pay

## 2020-09-12 ENCOUNTER — Other Ambulatory Visit (INDEPENDENT_AMBULATORY_CARE_PROVIDER_SITE_OTHER): Payer: Medicare Other

## 2020-09-12 DIAGNOSIS — N400 Enlarged prostate without lower urinary tract symptoms: Secondary | ICD-10-CM

## 2020-09-12 DIAGNOSIS — E785 Hyperlipidemia, unspecified: Secondary | ICD-10-CM | POA: Diagnosis not present

## 2020-09-12 DIAGNOSIS — E1169 Type 2 diabetes mellitus with other specified complication: Secondary | ICD-10-CM

## 2020-09-12 DIAGNOSIS — E039 Hypothyroidism, unspecified: Secondary | ICD-10-CM

## 2020-09-12 LAB — COMPREHENSIVE METABOLIC PANEL
ALT: 63 U/L — ABNORMAL HIGH (ref 0–53)
AST: 38 U/L — ABNORMAL HIGH (ref 0–37)
Albumin: 4.6 g/dL (ref 3.5–5.2)
Alkaline Phosphatase: 101 U/L (ref 39–117)
BUN: 20 mg/dL (ref 6–23)
CO2: 33 mEq/L — ABNORMAL HIGH (ref 19–32)
Calcium: 10.2 mg/dL (ref 8.4–10.5)
Chloride: 98 mEq/L (ref 96–112)
Creatinine, Ser: 0.85 mg/dL (ref 0.40–1.50)
GFR: 80.5 mL/min (ref >60.00)
Glucose, Bld: 144 mg/dL — ABNORMAL HIGH (ref 70–99)
Potassium: 4.7 mEq/L (ref 3.5–5.1)
Sodium: 138 mEq/L (ref 135–145)
Total Bilirubin: 0.6 mg/dL (ref 0.2–1.2)
Total Protein: 7.2 g/dL (ref 6.0–8.3)

## 2020-09-12 LAB — LIPID PANEL
Cholesterol: 155 mg/dL (ref 0–200)
HDL: 56.1 mg/dL (ref >39.00)
LDL Cholesterol: 77 mg/dL (ref 0–99)
NonHDL: 98.7
Total CHOL/HDL Ratio: 3
Triglycerides: 107 mg/dL (ref 0.0–149.0)
VLDL: 21.4 mg/dL (ref 0.0–40.0)

## 2020-09-12 LAB — T4, FREE: Free T4: 1 ng/dL (ref 0.60–1.60)

## 2020-09-12 LAB — PSA: PSA: 0.86 ng/mL (ref 0.10–4.00)

## 2020-09-12 LAB — HEMOGLOBIN A1C: Hgb A1c MFr Bld: 7.3 % — ABNORMAL HIGH (ref 4.6–6.5)

## 2020-09-12 LAB — TSH: TSH: 5.94 u[IU]/mL — ABNORMAL HIGH (ref 0.35–4.50)

## 2020-09-14 ENCOUNTER — Telehealth: Payer: Self-pay

## 2020-09-14 MED ORDER — METFORMIN HCL ER 500 MG PO TB24: 1000.0000 mg | ORAL_TABLET | Freq: Two times a day (BID) | ORAL | 0 refills | Status: DC

## 2020-09-14 NOTE — Telephone Encounter (Signed)
Received faxed refill request from [facility].  E-scribed refill.

## 2020-09-19 ENCOUNTER — Encounter: Payer: Self-pay | Admitting: Family Medicine

## 2020-09-19 ENCOUNTER — Ambulatory Visit (INDEPENDENT_AMBULATORY_CARE_PROVIDER_SITE_OTHER): Payer: Medicare Other | Admitting: Family Medicine

## 2020-09-19 ENCOUNTER — Other Ambulatory Visit: Payer: Self-pay

## 2020-09-19 VITALS — BP 164/64 | HR 67 | Temp 97.6°F | Ht 66.5 in | Wt 129.0 lb

## 2020-09-19 DIAGNOSIS — J449 Chronic obstructive pulmonary disease, unspecified: Secondary | ICD-10-CM

## 2020-09-19 DIAGNOSIS — Z0000 Encounter for general adult medical examination without abnormal findings: Secondary | ICD-10-CM

## 2020-09-19 DIAGNOSIS — F331 Major depressive disorder, recurrent, moderate: Secondary | ICD-10-CM | POA: Diagnosis not present

## 2020-09-19 DIAGNOSIS — J849 Interstitial pulmonary disease, unspecified: Secondary | ICD-10-CM

## 2020-09-19 DIAGNOSIS — F1021 Alcohol dependence, in remission: Secondary | ICD-10-CM | POA: Diagnosis not present

## 2020-09-19 DIAGNOSIS — R3915 Urgency of urination: Secondary | ICD-10-CM

## 2020-09-19 DIAGNOSIS — E1169 Type 2 diabetes mellitus with other specified complication: Secondary | ICD-10-CM

## 2020-09-19 DIAGNOSIS — I700 Atherosclerosis of aorta: Secondary | ICD-10-CM

## 2020-09-19 DIAGNOSIS — E785 Hyperlipidemia, unspecified: Secondary | ICD-10-CM

## 2020-09-19 DIAGNOSIS — E538 Deficiency of other specified B group vitamins: Secondary | ICD-10-CM | POA: Diagnosis not present

## 2020-09-19 DIAGNOSIS — E039 Hypothyroidism, unspecified: Secondary | ICD-10-CM

## 2020-09-19 DIAGNOSIS — N400 Enlarged prostate without lower urinary tract symptoms: Secondary | ICD-10-CM

## 2020-09-19 DIAGNOSIS — I10 Essential (primary) hypertension: Secondary | ICD-10-CM

## 2020-09-19 DIAGNOSIS — R7401 Elevation of levels of liver transaminase levels: Secondary | ICD-10-CM

## 2020-09-19 LAB — POC URINALSYSI DIPSTICK (AUTOMATED)
Bilirubin, UA: NEGATIVE
Blood, UA: NEGATIVE
Glucose, UA: POSITIVE — AB
Ketones, UA: NEGATIVE
Leukocytes, UA: NEGATIVE
Nitrite, UA: NEGATIVE
Protein, UA: NEGATIVE
Spec Grav, UA: 1.01 (ref 1.010–1.025)
Urobilinogen, UA: 0.2 E.U./dL
pH, UA: 7 (ref 5.0–8.0)

## 2020-09-19 MED ORDER — METFORMIN HCL ER 500 MG PO TB24: 1000.0000 mg | ORAL_TABLET | Freq: Two times a day (BID) | ORAL | 3 refills | Status: AC

## 2020-09-19 MED ORDER — SERTRALINE HCL 50 MG PO TABS: 50.0000 mg | ORAL_TABLET | Freq: Every day | ORAL | 3 refills | Status: AC

## 2020-09-19 MED ORDER — ATORVASTATIN CALCIUM 10 MG PO TABS
5.0000 mg | ORAL_TABLET | Freq: Every day | ORAL | 3 refills | Status: AC
Start: 2020-09-19 — End: ?

## 2020-09-19 MED ORDER — FINASTERIDE 5 MG PO TABS
5.0000 mg | ORAL_TABLET | Freq: Every evening | ORAL | 3 refills | Status: AC
Start: 2020-09-19 — End: ?

## 2020-09-19 MED ORDER — QUINAPRIL HCL 20 MG PO TABS: 20.0000 mg | ORAL_TABLET | Freq: Every day | ORAL | 3 refills | Status: AC

## 2020-09-19 MED ORDER — OMEPRAZOLE 40 MG PO CPDR: 40.0000 mg | DELAYED_RELEASE_CAPSULE | Freq: Every day | ORAL | 3 refills | Status: AC

## 2020-09-19 MED ORDER — LEVOTHYROXINE SODIUM 50 MCG PO TABS: 50.0000 ug | ORAL_TABLET | Freq: Every day | ORAL | 3 refills | Status: AC

## 2020-09-19 MED ORDER — CARVEDILOL 3.125 MG PO TABS: ORAL_TABLET | ORAL | 3 refills | Status: AC

## 2020-09-19 MED ORDER — CYANOCOBALAMIN 1000 MCG/ML IJ SOLN
1000.0000 ug | Freq: Once | INTRAMUSCULAR | Status: AC
  Administered 2020-09-19: 1000 ug via INTRAMUSCULAR

## 2020-09-19 MED ORDER — QUINAPRIL-HYDROCHLOROTHIAZIDE 20-25 MG PO TABS: 1.0000 | ORAL_TABLET | Freq: Every day | ORAL | 3 refills | Status: AC

## 2020-09-19 NOTE — Progress Notes (Incomplete)
Patient ID: Justin Benson, male    DOB: [birth year], 86 y.o.   MRN: [id number]  This visit was conducted in person.  BP (!) 164/64   Pulse 67   Temp 97.6 F (36.4 C) (Temporal)   Ht 5' 6.5" (1.689 m)   Wt 129 lb (58.5 kg)   SpO2 97%   BMI 20.51 kg/m   BP Readings from Last 3 Encounters:  09/19/20 (!) 164/64  09/08/20 (!) 159/76  05/10/20 (!) 150/66  On my repeat: 164/60 With home wrist cuff: 179/59 With home arm cuff: 175/66 CC: CPE Subjective:   HPI: Justin Benson is a 86 y.o. male presenting on 09/19/2020 for Annual Exam (Prt 2. )   Saw health advisor last week for medicare wellness visit. Note reviewed.   No exam data present  Flowsheet Row Clinical Support from 09/08/2020 in [place] at [facility] Total Score 4      Fall Risk  09/08/2020 09/06/2019 08/28/2018 08/20/2017 06/10/2016  Falls in the past year? 1 0 0 No No  Comment - - - - -  Number falls in past yr: 0 - - - -  Injury with Fall? 1 - - - -  Comment hurt shoulder - - - -  Risk for fall due to : Medication side effect;Impaired balance/gait - - - -  Risk for fall due to: Comment - - - - -  Follow up Falls evaluation completed;Falls prevention discussed - - - -  Fall at [employer] parking lot, injured R shoulder, saw sports medicine. Has had several falls since, latest 1 week ago fell into ditch without injury.   Saw GI last year - found amlodipine driven transaminitis. Started iron supplements for IDA (QOD). Nightly pepcid has helped hoarseness/throat clearing.   HTN - continues quinapril/hctz 20/25mg  in am and plain quinapril 20mg  at bedtime. Home BP readings overall ok, some too high and some too low. 109-160/40-70s. Occasional lightheadedness.   DM - compliant with metformin XR 1000mg  bid.   Preventative: COLONOSCOPY Date: 02/2014 1 polyp, diverticulosis, no rpt needed [name]) Colonoscopy 01/2018 - 1 TA, no rpt needed [name])  EGD 01/2018 - mild gastritis [name]) Prostate cancer  screening - strong fmhx prostate cancer. Personal h/o BPH on finasteride. Yearly checks - discussed, will stop screening, consider PSA for h/o BPH.  Lung cancer screening - not eligible Flu shotyearly through [place] 07/2019 x2, 04/2020 Td 2011  Pneumovax 2004, prevnar 2015 zostavax 2012 shingrix -10/2017, 01/2018  Advanced directives:copy in chart 11/2013.HCPOA would be daughter, Azai Gaffin in [place]. Would be ok with temporary measures - feeding tube, breathing tube, but doesn't want prolonged life support Seat belt use discussed Sunscreen use discussed. No changing moles on skin. Sees derm.  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     Relevant past medical, surgical, family and social history reviewed and updated as indicated. Interim medical history since our last visit reviewed. Allergies and medications reviewed and updated. Outpatient Medications Prior to Visit  Medication  Sig Dispense Refill  . famotidine (PEPCID) 20 MG tablet TAKE 1 TABLET BY MOUTH EVERYDAY AT BEDTIME 30 tablet 11  . ferrous sulfate 325 (65 FE) MG tablet Take 1 tablet (325 mg total) by mouth every other day.  3  . Multiple Vitamin (MULTIVITAMIN WITH MINERALS) TABS Take 1 tablet by mouth daily.    . polyethylene glycol powder (GLYCOLAX/MIRALAX) 17 GM/SCOOP powder Take 8.5 g by mouth daily as needed for moderate constipation. Hold for diarrhea 3350 g 1  . atorvastatin  (LIPITOR) 10 MG tablet Take 0.5 tablets (5 mg total) by mouth daily. 45 tablet 3  . carvedilol (COREG) 3.125 MG tablet Take 1 tablet (3.125 mg total) by mouth 2 (two) times daily. 180 tablet 0  . finasteride (PROSCAR) 5 MG tablet Take 1 tablet (5 mg total) by mouth every evening. 90 tablet 0  . levothyroxine (SYNTHROID) 25 MCG tablet TAKE 1 TABLET BY MOUTH  DAILY BEFORE BREAKFAST 90 tablet 1  . metFORMIN (GLUCOPHAGE-XR) 500 MG 24 hr tablet Take 2 tablets (1,000 mg total) by mouth 2 (two) times daily. 360 tablet 0  . omeprazole (PRILOSEC) 40 MG capsule Take 1 capsule (40 mg total) by mouth daily. 90 capsule 2  . quinapril (ACCUPRIL) 20 MG tablet TAKE 1 TABLET BY MOUTH AT  BEDTIME 90 tablet 3  . quinapril-hydrochlorothiazide (ACCURETIC) 20-25 MG tablet Take 1 tablet by mouth daily. 90 tablet 1  . sertraline (ZOLOFT) 50 MG tablet Take 1 tablet (50 mg total) by mouth daily. 90 tablet 1   No facility-administered medications prior to visit.     Per HPI unless specifically indicated in ROS section below Review of Systems  Constitutional: Positive for appetite change (increased). Negative for activity change, chills, fatigue, fever and unexpected weight change.  HENT: Negative for hearing loss.   Eyes: Negative for visual disturbance.  Respiratory: Positive for cough. Negative for chest tightness, shortness of breath and wheezing.   Cardiovascular: Negative for chest pain, palpitations and leg swelling.  Gastrointestinal: Positive for constipation (see HPI) and nausea (am - improved on pepcid). Negative for abdominal distention, abdominal pain, blood in stool, diarrhea and vomiting.  Genitourinary: Negative for difficulty urinating and hematuria.  Musculoskeletal: Negative for arthralgias, myalgias and neck pain.  Skin: Negative for rash.  Neurological: Positive for light-headedness. Negative for dizziness, seizures, syncope and headaches.       Difficulty with balance  Hematological: Negative for  adenopathy. Does not bruise/bleed easily.  Psychiatric/Behavioral: Positive for dysphoric mood (ongoing struggle with this). The patient is not nervous/anxious.    Objective:  BP (!) 164/64   Pulse 67   Temp 97.6 F (36.4 C) (Temporal)   Ht 5' 6.5" (1.689 m)   Wt 129 lb (58.5 kg)   SpO2 97%   BMI 20.51 kg/m   Wt Readings from Last 3 Encounters:  09/19/20 129 lb (58.5 kg)  05/10/20 128 lb 3 oz (58.1 kg)  03/22/20 127 lb (57.6 kg)      Physical Exam Vitals and nursing note reviewed.  Constitutional:      General: He is not in acute distress.    Appearance: Normal appearance. He is well-developed and well-nourished. He is not ill-appearing.  HENT:     Head: Normocephalic and atraumatic.     Right Ear: Hearing, tympanic membrane, ear canal and external ear normal.     Left Ear: Hearing, tympanic membrane, ear canal and external ear normal.     Nose: Nose normal.     Mouth/Throat:  Mouth: Oropharynx is clear and moist and mucous membranes are normal.     Pharynx: No posterior oropharyngeal edema.  Eyes:     General: No scleral icterus.    Extraocular Movements: EOM normal.     Conjunctiva/sclera: Conjunctivae normal.     Pupils: Pupils are equal, round, and reactive to light.  Neck:     Thyroid: No thyroid mass or thyromegaly.     Vascular: No carotid bruit.  Cardiovascular:     Rate and Rhythm: Normal rate and regular rhythm.     Pulses: Normal pulses and intact distal pulses.          Radial pulses are 2+ on the right side and 2+ on the left side.     Heart sounds: Normal heart sounds. No murmur heard.   Pulmonary:     Effort: Pulmonary effort is normal. No respiratory distress.     Breath sounds: Normal breath sounds. No wheezing, rhonchi or rales.  Abdominal:     General: Bowel sounds are normal. There is no distension.     Palpations: Abdomen is soft. There is no mass.     Tenderness: There is no abdominal tenderness. There is no guarding or rebound.      Hernia: No hernia is present.  Musculoskeletal:        General: No edema. Normal range of motion.     Cervical back: Normal range of motion and neck supple.     Right lower leg: No edema.     Left lower leg: No edema.  Lymphadenopathy:     Cervical: No cervical adenopathy.  Skin:    General: Skin is warm and dry.     Findings: No rash.  Neurological:     General: No focal deficit present.     Mental Status: He is alert and oriented to person, place, and time.     Comments: CN grossly intact, station and gait intact  Psychiatric:        Mood and Affect: Mood and affect and mood normal.        Behavior: Behavior normal.        Thought Content: Thought content normal.        Judgment: Judgment normal.       Results for orders placed or performed in visit on 09/19/20  POCT Urinalysis Dipstick (Automated)  Result Value Ref Range   Color, UA light yellow    Clarity, UA clear    Glucose, UA Positive (A) Negative   Bilirubin, UA negative    Ketones, UA negative    Spec Grav, UA 1.010 1.010 - 1.025   Blood, UA negative    pH, UA 7.0 5.0 - 8.0   Protein, UA Negative Negative   Urobilinogen, UA 0.2 0.2 or 1.0 E.U./dL   Nitrite, UA negative    Leukocytes, UA Negative Negative   Lab Results  Component Value Date   VITAMINB12 355 01/01/2019   Depression screen [facility] 2/9 09/08/2020 09/06/2019 08/28/2018 08/20/2017 06/10/2016  Decreased Interest 2 0 2 1 2   Down, Depressed, Hopeless 2 1 2 1  0  PHQ - 2 Score 4 1 4 2 2   Altered sleeping 0 1 2 0 0  Tired, decreased energy 0 1 3 1 2   Change in appetite 0 1 1 0 0  Feeling bad or failure about yourself  0 0 3 1 2   Trouble concentrating 0 0 3 0 0  Moving slowly or fidgety/restless 0 0 0 0 0  Suicidal thoughts 0 0 0 0 0  PHQ-9 Score 4 4 16 4 6   Difficult doing work/chores Somewhat difficult - Not difficult at all Not difficult at all Not difficult at all   Assessment & Plan:  This visit occurred during the SARS-CoV-2 public health  emergency.  Safety protocols were in place, including screening questions prior to the visit, additional usage of staff PPE, and extensive cleaning of exam room while observing appropriate contact time as indicated for disinfecting solutions.   Problem List Items Addressed This Visit    Health maintenance examination - Primary    Preventative protocols reviewed and updated unless pt declined. Discussed healthy diet and lifestyle.       Relevant Orders   POCT Urinalysis Dipstick (Automated) (Completed)    Other Visit Diagnoses    Vitamin B12 deficiency       Relevant Medications   cyanocobalamin ((VITAMIN B-12)) injection 1,000 mcg (Completed)       Meds ordered this encounter  Medications  . atorvastatin (LIPITOR) 10 MG tablet    Sig: Take 0.5 tablets (5 mg total) by mouth daily.    Dispense:  45 tablet    Refill:  3  . carvedilol (COREG) 3.125 MG tablet    Sig: Take 2 tablets (6.25 mg total) by mouth every morning AND 1 tablet (3.125 mg total) at bedtime.    Dispense:  270 tablet    Refill:  3  . finasteride (PROSCAR) 5 MG tablet    Sig: Take 1 tablet (5 mg total) by mouth every evening.    Dispense:  90 tablet    Refill:  3  . levothyroxine (SYNTHROID) 50 MCG tablet    Sig: Take 1 tablet (50 mcg total) by mouth daily before breakfast.    Dispense:  90 tablet    Refill:  3  . metFORMIN (GLUCOPHAGE-XR) 500 MG 24 hr tablet    Sig: Take 2 tablets (1,000 mg total) by mouth 2 (two) times daily.    Dispense:  360 tablet    Refill:  3  . omeprazole (PRILOSEC) 40 MG capsule    Sig: Take 1 capsule (40 mg total) by mouth daily.    Dispense:  90 capsule    Refill:  3  . quinapril (ACCUPRIL) 20 MG tablet    Sig: Take 1 tablet (20 mg total) by mouth at bedtime.    Dispense:  90 tablet    Refill:  3  . quinapril-hydrochlorothiazide (ACCURETIC) 20-25 MG tablet    Sig: Take 1 tablet by mouth daily.    Dispense:  90 tablet    Refill:  3  . sertraline (ZOLOFT) 50 MG tablet    Sig:  Take 1 tablet (50 mg total) by mouth daily.    Dispense:  90 tablet    Refill:  3  . cyanocobalamin ((VITAMIN B-12)) injection 1,000 mcg   Orders Placed This Encounter  Procedures  . POCT Urinalysis Dipstick (Automated)    Patient instructions: Blood pressures are staying too high - increase carvedilol to 2 tablets in the morning, 1 in the evening. Update [place] with readings.  Urinalysis today  B12 shot today then start vitamin B12 500-1046mcg daily over the counter.  Thyroid dose was a bit low - increase to 76mcg daily. Other medicines refilled.  Return as needed or in 3 months for follow up blood pressure and possible blood work.   Follow up plan: Return in about 3 months (around 12/17/2020) for follow up visit.  [name], MD

## 2020-09-19 NOTE — Patient Instructions (Addendum)
Blood pressures are staying too high - increase carvedilol to 2 tablets in the morning, 1 in the evening. Update [place] with readings.  Urinalysis today  B12 shot today then start vitamin B12 500-1044mcg daily over the counter.  Thyroid dose was a bit low - increase to 77mcg daily. Other medicines refilled.  Return as needed or in 3 months for follow up blood pressure and possible blood work.   Vitamin B12 Deficiency Vitamin B12 deficiency means that your body does not have enough vitamin B12. The body needs this vitamin:  To make red blood cells.  To make genes (DNA).  To help the nerves work. If you do not have enough vitamin B12 in your body, you can have health problems. What are the causes?  Not eating enough foods that contain vitamin B12.  Not being able to absorb vitamin B12 from the food that you eat.  Certain digestive system diseases.  A condition in which the body does not make enough of a certain protein, which results in too few red blood cells (pernicious anemia).  Having a surgery in which part of the stomach or small intestine is removed.  Taking medicines that make it hard for the body to absorb vitamin B12. These medicines include: ? Heartburn medicines. ? Some antibiotic medicines. ? Other medicines that are used to treat certain conditions. What increases the risk?  Being older than age 39.  Eating a vegetarian or vegan diet, especially while you are pregnant.  Eating a poor diet while you are pregnant.  Taking certain medicines.  Having alcoholism. What are the signs or symptoms? In some cases, there are no symptoms. If the condition leads to too few blood cells or nerve damage, symptoms can occur, such as:  Feeling weak.  Feeling tired (fatigued).  Not being hungry.  Weight loss.  A loss of feeling (numbness) or tingling in your hands and feet.  Redness and burning of the tongue.  Being mixed up (confused) or having memory  problems.  Sadness (depression).  Problems with your senses. This can include color blindness, ringing in the ears, or loss of taste.  Watery poop (diarrhea) or trouble pooping (constipation).  Trouble walking. If anemia is very bad, symptoms can include:  Being short of breath.  Being dizzy.  Having a very fast heartbeat. How is this treated?  Changing the way you eat and drink, such as: ? Eating more foods that contain vitamin B12. ? Drinking little or no alcohol.  Getting vitamin B12 shots.  Taking vitamin B12 supplements. Your doctor will tell you the dose that is best for you. Follow these instructions at home: Eating and drinking  Eat lots of healthy foods that contain vitamin B12. These include: ? Meats and poultry, such as beef, pork, chicken, [place], and organ meats, such as liver. ? Seafood, such as clams, rainbow trout, salmon, tuna, and haddock. ? Eggs. ? Cereal and dairy products that have vitamin B12 added to them. Check the label. The items listed above may not be a complete list of what you can eat and drink. Contact a dietitian for more options.   General instructions  Get any shots as told by your doctor.  Take supplements only as told by your doctor.  Do not drink alcohol if your doctor tells you not to. In some cases, you may only be asked to limit alcohol use.  Keep all follow-up visits as told by your doctor. This is important. Contact a doctor if:  Your symptoms come back. Get help right away if:  You have trouble breathing.  You have a very fast heartbeat.  You have chest pain.  You get dizzy.  You pass out. Summary  Vitamin B12 deficiency means that your body is not getting enough vitamin B12.  In some cases, there are no symptoms of this condition.  Treatment may include making a change in the way you eat and drink, getting vitamin B12 shots, or taking supplements.  Eat lots of healthy foods that contain vitamin B12. This  information is not intended to replace advice given to you by your health care provider. Make sure you discuss any questions you have with your health care provider. Document Revised: 03/24/2018 Document Reviewed: 03/24/2018 Elsevier Patient Education  2021 [employer].

## 2020-09-19 NOTE — Progress Notes (Signed)
Patient ID: Justin Benson, male    DOB: 1934/12/22, 86 y.o.   MRN: [id number]  This visit was conducted in person.  BP (!) 164/64   Pulse 67   Temp 97.6 F (36.4 C) (Temporal)   Ht 5' 6.5" (1.689 m)   Wt 129 lb (58.5 kg)   SpO2 97%   BMI 20.51 kg/m   BP Readings from Last 3 Encounters:  09/19/20 (!) 164/64  09/08/20 (!) 159/76  05/10/20 (!) 150/66  On my repeat: 164/60 With home wrist cuff: 179/59 With home arm cuff: 175/66 CC: CPE Subjective:   HPI: Justin Benson is a 86 y.o. male presenting on 09/19/2020 for Annual Exam (Prt 2. )   Saw health advisor last week for medicare wellness visit. Note reviewed.   No exam data present  Flowsheet Row Clinical Support from 09/08/2020 in [place] at [facility] Total Score 4      Fall Risk  09/08/2020 09/06/2019 08/28/2018 08/20/2017 06/10/2016  Falls in the past year? 1 0 0 No No  Comment - - - - -  Number falls in past yr: 0 - - - -  Injury with Fall? 1 - - - -  Comment hurt shoulder - - - -  Risk for fall due to : Medication side effect;Impaired balance/gait - - - -  Risk for fall due to: Comment - - - - -  Follow up Falls evaluation completed;Falls prevention discussed - - - -  Fall at [employer] parking lot, injured R shoulder, saw sports medicine. Has had several falls since, latest 1 week ago fell into ditch without injury.   Saw GI last year - found amlodipine driven transaminitis. Started iron supplements for IDA (QOD). Nightly pepcid has helped hoarseness/throat clearing.   HTN - continues quinapril/hctz 20/25mg  in am and plain quinapril 20mg  at bedtime. Home BP readings overall ok, some too high and some too low. 109-160/40-70s. Occasional lightheadedness.   DM - compliant with metformin XR 1000mg  bid.   Preventative: COLONOSCOPY Date: 02/2014 1 polyp, diverticulosis, no rpt needed Justin Benson) Colonoscopy 01/2018 - 1 TA, no rpt needed Justin Benson)  EGD 01/2018 - mild gastritis Justin Benson) Prostate cancer  screening - strong fmhx prostate cancer. Personal h/o BPH on finasteride. Yearly checks - discussed, will stop screening, consider PSA for h/o BPH.  Lung cancer screening - not eligible Flu shotyearly through [place] 07/2019 x2, 04/2020 Td 2011  Pneumovax 2004, prevnar 2015 zostavax 2012 shingrix -10/2017, 01/2018  Advanced directives:copy in chart 11/2013.HCPOA would be daughter, Justin Benson in [place]. Would be ok with temporary measures - feeding tube, breathing tube, but doesn't want prolonged life support Seat belt use discussed Sunscreen use discussed. No changing moles on skin. Sees derm.  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     Relevant past medical, surgical, family and social history reviewed and updated as indicated. Interim medical history since our last visit reviewed. Allergies and medications reviewed and updated. Outpatient Medications Prior to Visit  Medication  Sig Dispense Refill  . famotidine (PEPCID) 20 MG tablet TAKE 1 TABLET BY MOUTH EVERYDAY AT BEDTIME 30 tablet 11  . ferrous sulfate 325 (65 FE) MG tablet Take 1 tablet (325 mg total) by mouth every other day.  3  . Multiple Vitamin (MULTIVITAMIN WITH MINERALS) TABS Take 1 tablet by mouth daily.    . polyethylene glycol powder (GLYCOLAX/MIRALAX) 17 GM/SCOOP powder Take 8.5 g by mouth daily as needed for moderate constipation. Hold for diarrhea 3350 g 1  . atorvastatin  (LIPITOR) 10 MG tablet Take 0.5 tablets (5 mg total) by mouth daily. 45 tablet 3  . carvedilol (COREG) 3.125 MG tablet Take 1 tablet (3.125 mg total) by mouth 2 (two) times daily. 180 tablet 0  . finasteride (PROSCAR) 5 MG tablet Take 1 tablet (5 mg total) by mouth every evening. 90 tablet 0  . levothyroxine (SYNTHROID) 25 MCG tablet TAKE 1 TABLET BY MOUTH  DAILY BEFORE BREAKFAST 90 tablet 1  . metFORMIN (GLUCOPHAGE-XR) 500 MG 24 hr tablet Take 2 tablets (1,000 mg total) by mouth 2 (two) times daily. 360 tablet 0  . omeprazole (PRILOSEC) 40 MG capsule Take 1 capsule (40 mg total) by mouth daily. 90 capsule 2  . quinapril (ACCUPRIL) 20 MG tablet TAKE 1 TABLET BY MOUTH AT  BEDTIME 90 tablet 3  . quinapril-hydrochlorothiazide (ACCURETIC) 20-25 MG tablet Take 1 tablet by mouth daily. 90 tablet 1  . sertraline (ZOLOFT) 50 MG tablet Take 1 tablet (50 mg total) by mouth daily. 90 tablet 1   No facility-administered medications prior to visit.     Per HPI unless specifically indicated in ROS section below Review of Systems  Constitutional: Positive for appetite change (increased). Negative for activity change, chills, fatigue, fever and unexpected weight change.  HENT: Negative for hearing loss.   Eyes: Negative for visual disturbance.  Respiratory: Positive for cough. Negative for chest tightness, shortness of breath and wheezing.   Cardiovascular: Negative for chest pain, palpitations and leg swelling.  Gastrointestinal: Positive for constipation (see HPI) and nausea (am - improved on pepcid). Negative for abdominal distention, abdominal pain, blood in stool, diarrhea and vomiting.  Genitourinary: Negative for difficulty urinating and hematuria.  Musculoskeletal: Negative for arthralgias, myalgias and neck pain.  Skin: Negative for rash.  Neurological: Positive for light-headedness. Negative for dizziness, seizures, syncope and headaches.       Difficulty with balance  Hematological: Negative for  adenopathy. Does not bruise/bleed easily.  Psychiatric/Behavioral: Positive for dysphoric mood (ongoing struggle with this). The patient is not nervous/anxious.    Objective:  BP (!) 164/64   Pulse 67   Temp 97.6 F (36.4 C) (Temporal)   Ht 5' 6.5" (1.689 m)   Wt 129 lb (58.5 kg)   SpO2 97%   BMI 20.51 kg/m   Wt Readings from Last 3 Encounters:  09/19/20 129 lb (58.5 kg)  05/10/20 128 lb 3 oz (58.1 kg)  03/22/20 127 lb (57.6 kg)      Physical Exam Vitals and nursing note reviewed.  Constitutional:      General: He is not in acute distress.    Appearance: Normal appearance. He is well-developed and well-nourished. He is not ill-appearing.  HENT:     Head: Normocephalic and atraumatic.     Right Ear: Hearing, tympanic membrane, ear canal and external ear normal.     Left Ear: Hearing, tympanic membrane, ear canal and external ear normal.     Nose: Nose normal.     Mouth/Throat:  Mouth: Oropharynx is clear and moist and mucous membranes are normal.     Pharynx: No posterior oropharyngeal edema.  Eyes:     General: No scleral icterus.    Extraocular Movements: EOM normal.     Conjunctiva/sclera: Conjunctivae normal.     Pupils: Pupils are equal, round, and reactive to light.  Neck:     Thyroid: No thyroid mass or thyromegaly.     Vascular: No carotid bruit.  Cardiovascular:     Rate and Rhythm: Normal rate and regular rhythm.     Pulses: Normal pulses and intact distal pulses.          Radial pulses are 2+ on the right side and 2+ on the left side.     Heart sounds: Normal heart sounds. No murmur heard.   Pulmonary:     Effort: Pulmonary effort is normal. No respiratory distress.     Breath sounds: Normal breath sounds. No wheezing, rhonchi or rales.  Abdominal:     General: Bowel sounds are normal. There is no distension.     Palpations: Abdomen is soft. There is no mass.     Tenderness: There is no abdominal tenderness. There is no guarding or rebound.      Hernia: No hernia is present.  Musculoskeletal:        General: No edema. Normal range of motion.     Cervical back: Normal range of motion and neck supple.     Right lower leg: No edema.     Left lower leg: No edema.  Lymphadenopathy:     Cervical: No cervical adenopathy.  Skin:    General: Skin is warm and dry.     Findings: No rash.  Neurological:     General: No focal deficit present.     Mental Status: He is alert and oriented to person, place, and time.     Comments: CN grossly intact, station and gait intact  Psychiatric:        Mood and Affect: Mood and affect and mood normal.        Behavior: Behavior normal.        Thought Content: Thought content normal.        Judgment: Judgment normal.       Results for orders placed or performed in visit on 09/19/20  POCT Urinalysis Dipstick (Automated)  Result Value Ref Range   Color, UA light yellow    Clarity, UA clear    Glucose, UA Positive (A) Negative   Bilirubin, UA negative    Ketones, UA negative    Spec Grav, UA 1.010 1.010 - 1.025   Blood, UA negative    pH, UA 7.0 5.0 - 8.0   Protein, UA Negative Negative   Urobilinogen, UA 0.2 0.2 or 1.0 E.U./dL   Nitrite, UA negative    Leukocytes, UA Negative Negative   Lab Results  Component Value Date   VITAMINB12 355 01/01/2019   Depression screen [facility] 2/9 09/08/2020 09/06/2019 08/28/2018 08/20/2017 06/10/2016  Decreased Interest 2 0 2 1 2   Down, Depressed, Hopeless 2 1 2 1  0  PHQ - 2 Score 4 1 4 2 2   Altered sleeping 0 1 2 0 0  Tired, decreased energy 0 1 3 1 2   Change in appetite 0 1 1 0 0  Feeling bad or failure about yourself  0 0 3 1 2   Trouble concentrating 0 0 3 0 0  Moving slowly or fidgety/restless 0 0 0 0 0  Suicidal thoughts 0 0 0 0 0  PHQ-9 Score 4 4 16 4 6   Difficult doing work/chores Somewhat difficult - Not difficult at all Not difficult at all Not difficult at all   Assessment & Plan:  This visit occurred during the SARS-CoV-2 public health  emergency.  Safety protocols were in place, including screening questions prior to the visit, additional usage of staff PPE, and extensive cleaning of exam room while observing appropriate contact time as indicated for disinfecting solutions.   Problem List Items Addressed This Visit    Urinary urgency    Check UA today. ?BPH related - continue finasteride.      Type 2 diabetes mellitus with other specified complication (HCC)    Chronic, deteriorated control despite full dose metformin XL 2000mg  daily.  Discussed renewed efforts towards low sugar low carb diabetic diet       Relevant Medications   atorvastatin (LIPITOR) 10 MG tablet   metFORMIN (GLUCOPHAGE-XR) 500 MG 24 hr tablet   quinapril (ACCUPRIL) 20 MG tablet   quinapril-hydrochlorothiazide (ACCURETIC) 20-25 MG tablet   Transaminitis    Amlodipine associated rise in liver functions.  Now off this, LFTs slightly elevated- will continue to monitor.       MDD (major depressive disorder), recurrent episode, moderate (HCC)    Stable period on sertraline 50mg  daily - continue.       Relevant Medications   sertraline (ZOLOFT) 50 MG tablet   Low serum vitamin B12    B12 shot today, then start daily oral replacement.       Interstitial lung disease ([place])    Noted on CXR - pt overall asxs. Will continue to monitor.       Hyperlipidemia associated with type 2 diabetes mellitus (HCC)    Levels stable on low dose atorvastatin. The ASCVD Risk score Mikey Bussing DC Jr., et al., 2013) failed to calculate for the following reasons:   The 2013 ASCVD risk score is only valid for ages 25 to 45       Relevant Medications   atorvastatin (LIPITOR) 10 MG tablet   metFORMIN (GLUCOPHAGE-XR) 500 MG 24 hr tablet   quinapril (ACCUPRIL) 20 MG tablet   quinapril-hydrochlorothiazide (ACCURETIC) 20-25 MG tablet   Health maintenance examination - Primary    Preventative protocols reviewed and updated unless pt declined. Discussed healthy diet and  lifestyle.       Relevant Orders   POCT Urinalysis Dipstick (Automated) (Completed)   Essential hypertension    Chronic, BP remains elevated.  Increase carvedilol to 25mg  in am and 12.5mg  in pm.       Relevant Medications   atorvastatin (LIPITOR) 10 MG tablet   carvedilol (COREG) 3.125 MG tablet   quinapril (ACCUPRIL) 20 MG tablet   quinapril-hydrochlorothiazide (ACCURETIC) 20-25 MG tablet   COPD (chronic obstructive pulmonary disease) (HCC)    Carries this dx - asxs, off respiratory medication.       Borderline hypothyroidism    TSH remains elevated - will increase levothyroxine to 65mcg daily.       Relevant Medications   carvedilol (COREG) 3.125 MG tablet   levothyroxine (SYNTHROID) 50 MCG tablet   Benign prostatic hyperplasia    Continues finasteride.       Relevant Medications   finasteride (PROSCAR) 5 MG tablet   Alcohol use disorder, severe, in sustained remission ([place])    Remains abstinent.       Abdominal aortic atherosclerosis (HCC)    Continue statin  Relevant Medications   atorvastatin (LIPITOR) 10 MG tablet   carvedilol (COREG) 3.125 MG tablet   quinapril (ACCUPRIL) 20 MG tablet   quinapril-hydrochlorothiazide (ACCURETIC) 20-25 MG tablet       Meds ordered this encounter  Medications  . atorvastatin (LIPITOR) 10 MG tablet    Sig: Take 0.5 tablets (5 mg total) by mouth daily.    Dispense:  45 tablet    Refill:  3  . carvedilol (COREG) 3.125 MG tablet    Sig: Take 2 tablets (6.25 mg total) by mouth every morning AND 1 tablet (3.125 mg total) at bedtime.    Dispense:  270 tablet    Refill:  3  . finasteride (PROSCAR) 5 MG tablet    Sig: Take 1 tablet (5 mg total) by mouth every evening.    Dispense:  90 tablet    Refill:  3  . levothyroxine (SYNTHROID) 50 MCG tablet    Sig: Take 1 tablet (50 mcg total) by mouth daily before breakfast.    Dispense:  90 tablet    Refill:  3  . metFORMIN (GLUCOPHAGE-XR) 500 MG 24 hr tablet    Sig: Take 2  tablets (1,000 mg total) by mouth 2 (two) times daily.    Dispense:  360 tablet    Refill:  3  . omeprazole (PRILOSEC) 40 MG capsule    Sig: Take 1 capsule (40 mg total) by mouth daily.    Dispense:  90 capsule    Refill:  3  . quinapril (ACCUPRIL) 20 MG tablet    Sig: Take 1 tablet (20 mg total) by mouth at bedtime.    Dispense:  90 tablet    Refill:  3  . quinapril-hydrochlorothiazide (ACCURETIC) 20-25 MG tablet    Sig: Take 1 tablet by mouth daily.    Dispense:  90 tablet    Refill:  3  . sertraline (ZOLOFT) 50 MG tablet    Sig: Take 1 tablet (50 mg total) by mouth daily.    Dispense:  90 tablet    Refill:  3  . cyanocobalamin ((VITAMIN B-12)) injection 1,000 mcg  . Cyanocobalamin (B-12) 1000 MCG SUBL    Sig: Place 1 tablet under the tongue daily.   Orders Placed This Encounter  Procedures  . POCT Urinalysis Dipstick (Automated)    Patient instructions: Blood pressures are staying too high - increase carvedilol to 2 tablets in the morning, 1 in the evening. Update [place] with readings.  Urinalysis today  B12 shot today then start vitamin B12 500-1055mcg daily over the counter.  Thyroid dose was a bit low - increase to 26mcg daily. Other medicines refilled.  Return as needed or in 3 months for follow up blood pressure and possible blood work.   Follow up plan: Return in about 3 months (around 12/17/2020) for follow up visit.  [name], MD

## 2020-09-19 NOTE — Assessment & Plan Note (Signed)
Preventative protocols reviewed and updated unless pt declined. Discussed healthy diet and lifestyle.  

## 2020-09-20 ENCOUNTER — Telehealth: Payer: Self-pay

## 2020-09-20 DIAGNOSIS — R3915 Urgency of urination: Secondary | ICD-10-CM | POA: Insufficient documentation

## 2020-09-20 DIAGNOSIS — E538 Deficiency of other specified B group vitamins: Secondary | ICD-10-CM | POA: Insufficient documentation

## 2020-09-20 MED ORDER — B-12 1000 MCG SL SUBL: 1.0000 | SUBLINGUAL_TABLET | Freq: Every day | SUBLINGUAL | Status: AC

## 2020-09-20 NOTE — Assessment & Plan Note (Signed)
Carries this dx - asxs, off respiratory medication.

## 2020-09-20 NOTE — Assessment & Plan Note (Signed)
Noted on CXR - pt overall asxs. Will continue to monitor.

## 2020-09-20 NOTE — Assessment & Plan Note (Signed)
Continue statin. 

## 2020-09-20 NOTE — Assessment & Plan Note (Signed)
Amlodipine associated rise in liver functions.  Now off this, LFTs slightly elevated- will continue to monitor.

## 2020-09-20 NOTE — Assessment & Plan Note (Signed)
Levels stable on low dose atorvastatin. The ASCVD Risk score Mikey Bussing DC Jr., et al., 2013) failed to calculate for the following reasons:   The 2013 ASCVD risk score is only valid for ages 38 to 34

## 2020-09-20 NOTE — Assessment & Plan Note (Signed)
Remains abstinent 

## 2020-09-20 NOTE — Assessment & Plan Note (Signed)
Chronic, deteriorated control despite full dose metformin XL 2000mg  daily.  Discussed renewed efforts towards low sugar low carb diabetic diet

## 2020-09-20 NOTE — Assessment & Plan Note (Signed)
Continues finasteride.

## 2020-09-20 NOTE — Assessment & Plan Note (Signed)
Stable period on sertraline 50mg daily - continue.  

## 2020-09-20 NOTE — Assessment & Plan Note (Signed)
TSH remains elevated - will increase levothyroxine to 23mcg daily.

## 2020-09-20 NOTE — Assessment & Plan Note (Signed)
Chronic, BP remains elevated.  Increase carvedilol to 25mg  in am and 12.5mg  in pm.

## 2020-09-20 NOTE — Assessment & Plan Note (Signed)
B12 shot today, then start daily oral replacement.

## 2020-09-20 NOTE — Assessment & Plan Note (Signed)
Check UA today. ?BPH related - continue finasteride.

## 2020-09-20 NOTE — Chronic Care Management (AMB) (Addendum)
Chronic Care Management Pharmacy Assistant   Name: Justin Benson  MRN: [id number] DOB: 18-Jul-1935  Reason for Encounter: Medication Adherence and Delivery Coordination  PCP : [name], MD  Allergies:   Allergies  Allergen Reactions   Amlodipine Other (See Comments)    transaminitis    Medications: Outpatient Encounter Medications as of 09/20/2020  Medication Sig   atorvastatin (LIPITOR) 10 MG tablet Take 0.5 tablets (5 mg total) by mouth daily.   carvedilol (COREG) 3.125 MG tablet Take 2 tablets (6.25 mg total) by mouth every morning AND 1 tablet (3.125 mg total) at bedtime.   Cyanocobalamin (B-12) 1000 MCG SUBL Place 1 tablet under the tongue daily.   famotidine (PEPCID) 20 MG tablet TAKE 1 TABLET BY MOUTH EVERYDAY AT BEDTIME   ferrous sulfate 325 (65 FE) MG tablet Take 1 tablet (325 mg total) by mouth every other day.   finasteride (PROSCAR) 5 MG tablet Take 1 tablet (5 mg total) by mouth every evening.   levothyroxine (SYNTHROID) 50 MCG tablet Take 1 tablet (50 mcg total) by mouth daily before breakfast.   metFORMIN (GLUCOPHAGE-XR) 500 MG 24 hr tablet Take 2 tablets (1,000 mg total) by mouth 2 (two) times daily.   Multiple Vitamin (MULTIVITAMIN WITH MINERALS) TABS Take 1 tablet by mouth daily.   omeprazole (PRILOSEC) 40 MG capsule Take 1 capsule (40 mg total) by mouth daily.   polyethylene glycol powder (GLYCOLAX/MIRALAX) 17 GM/SCOOP powder Take 8.5 g by mouth daily as needed for moderate constipation. Hold for diarrhea   quinapril (ACCUPRIL) 20 MG tablet Take 1 tablet (20 mg total) by mouth at bedtime.   quinapril-hydrochlorothiazide (ACCURETIC) 20-25 MG tablet Take 1 tablet by mouth daily.   sertraline (ZOLOFT) 50 MG tablet Take 1 tablet (50 mg total) by mouth daily.   No facility-administered encounter medications on file as of 09/20/2020.    Current Diagnosis: Patient Active Problem List   Diagnosis Date Noted   Low serum vitamin B12 09/20/2020   Urinary  urgency 09/20/2020   Hoarseness 05/10/2020   Constipation 02/28/2020   Carotid stenosis 09/12/2019   Interstitial lung disease ([place]) 09/09/2019   Dysphagia 09/06/2019   Nausea 09/06/2019   Bilateral leg edema 02/09/2019   Iron deficiency anemia 08/30/2017   Borderline hypothyroidism 08/30/2017   Kidney cyst, acquired 05/26/2017   Chronic lower back pain 06/24/2016   Serous otitis media with rupture of tympanic membrane 03/28/2016   Abdominal aortic atherosclerosis ([place]) 06/05/2014   Advanced care planning/counseling discussion 32/35/5732   Alcoholic hepatitis without ascites 06/02/2014   Health maintenance examination 06/02/2014   Medicare annual wellness visit, subsequent 06/01/2013   Weight loss 01/11/2013   Transaminitis 12/01/2012   Right shoulder pain 11/30/2012   Schwannoma of spinal cord ([place]) 11/26/2012   DIVERTICULOSIS OF COLON 12/22/2008   Hyperlipidemia associated with type 2 diabetes mellitus ([place]) 01/10/2007   Alcohol use disorder, severe, in sustained remission ([place]) 01/10/2007   Ex-smoker 01/10/2007   MDD (major depressive disorder), recurrent episode, moderate ([place]) 01/10/2007   COPD (chronic obstructive pulmonary disease) ([place]) 01/10/2007   Benign prostatic hyperplasia 01/10/2007   Type 2 diabetes mellitus with other specified complication ([place]) 20/25/4270   Essential hypertension 01/09/2007    Reviewed chart for medication changes ahead of medication coordination call.  Recent OV, Consult or Hospital visit: 09/19/20 Dr. Danise Mina- PCP Recent medication changes indicated: Dr. Danise Mina started patient on sublingual Vitamin B-12, increased carvedilol 3.125 mg from 2 tablets daily to 2 tablets in the morning and 1  tablet in the evening. Increased levothyroxine from 25 to 50 mcg.   BP Readings from Last 3 Encounters:  09/19/20 (!) 164/64  09/08/20 (!) 159/76  05/10/20 (!) 150/66    Lab Results  Component Value Date   HGBA1C 7.3 (H) 09/12/2020     Patient  obtains medications through Adherence Packaging  30 Days   Last adherence delivery date: 08/28/20, 30 DS Atorvastatin 10 mg - 1/2 tablet daily (evening meal)  Carvedilol 3.125 1 tablet twice daily (breakfast and dinner)  Famotidine 20 mg- 1 tablet daily (bedtime)   Finasteride 5 mg 1 tablet daily (evening meal) Levothyroxine 25 mcg- 1 tablet daily (before breakfast)   Metformin ER 500 mg- 2 tablets twice daily (breakfast and evening meal)  Omeprazole 40 mg- 1 tablet daily (breakfast)  Quinapril/HCTZ 20/25 mg - 1 tablet daily (breakfast)  Quinapril 20 mg - 1 tablet daily (evening meal)   Sertraline 50 mg - 1 tablet daily (breakfast)  Patient declined the following medications last delivery: Ferrous Sulfate 325mg  getting OTC  Mens multivitamin getting OTC  Polyethylene glycol getting OTC  Patient is due for next adherence delivery on: 09/27/20  Contacted patient and reviewed medications and coordinated delivery.  This delivery to include: Adherence Packaging  30 Days  Atorvastatin 10 mg - 1/2 tablet daily (1/2 evening meal)  Carvedilol 3.125- 3 tablets daily (2 breakfast and 1 evening meal) *increased dose 2/22* Famotidine 20 mg- 1 tablet daily (1 bedtime)   Finasteride 5 mg 1 tablet daily (1 evening meal) Levothyroxine 50 mcg- 1 tablet daily (1 before breakfast) *increased dose 2/22* Metformin ER 500 mg- 2 tablets twice daily (2 breakfast and 2 evening meal)  Omeprazole 40 mg- 1 tablet daily (1 breakfast)  Quinapril/HCTZ 20/25 mg - 1 tablet daily (1 breakfast)  Quinapril 20 mg - 1 tablet daily (1 evening meal)   Sertraline 50 mg - 1 tablet daily (1 breakfast)  PRN/VIAL medications:  None  Patient declined the following medications this month: Cyanocobalamin 1000 mg- 1 tablet sublingual daily - will purchase OTC  Ferrous Sulfate 325mg  getting OTC  Mens multivitamin getting OTC  Polyethylene glycol getting OTC  Patient does not need refills on any medications.  Confirmed  delivery date of 09/27/20, advised patient that pharmacy will contact him the morning of delivery.  Recent blood pressure readings are as follows: No dates or pulses.  109/52 113/52 135/60 129/60 135/45 121/43 129/54 159/76 166/72 150/59 148/60 139/60 131/55  Recent blood glucose readings are as follows: No dates or timing 144 140 150  States nausea improving. He is upset that his A1C is higher. He wants to work on getting this down.   Follow-Up:  Coordination of Enhanced Pharmacy Services and Pharmacist Review  Debbora Dus, CPP notified  Margaretmary Dys, [facility] Pharmacy Assistant [phone]  I have reviewed the care management and care coordination activities outlined in this encounter and I am certifying that I agree with the content of this note. No further action required.  Debbora Dus, PharmD Clinical Pharmacist [facility] at [facility] [phone]

## 2020-10-03 ENCOUNTER — Other Ambulatory Visit: Payer: Self-pay

## 2020-10-03 ENCOUNTER — Ambulatory Visit (INDEPENDENT_AMBULATORY_CARE_PROVIDER_SITE_OTHER): Payer: Medicare Other

## 2020-10-03 ENCOUNTER — Telehealth: Payer: Medicare Other

## 2020-10-03 DIAGNOSIS — I10 Essential (primary) hypertension: Secondary | ICD-10-CM | POA: Diagnosis not present

## 2020-10-03 DIAGNOSIS — E1169 Type 2 diabetes mellitus with other specified complication: Secondary | ICD-10-CM | POA: Diagnosis not present

## 2020-10-03 NOTE — Progress Notes (Signed)
Chronic Care Management Pharmacy Note  10/05/2020 Name:  Justin Benson MRN:  [id number] DOB:  [birth year]  Subjective: Justin Benson is an 86 y.o. year old male who is a primary patient of [name], MD.  The CCM team was consulted for assistance with disease management and care coordination needs.    Engaged with patient by telephone for follow up visit in response to provider referral for pharmacy case management and/or care coordination services.   Consent to Services:  The patient was given information about Chronic Care Management services, agreed to services, and gave verbal consent prior to initiation of services.  Please see initial visit note for detailed documentation.   Patient Care Team: [name], MD as PCP - General (Family Medicine) Melida Quitter, MD as Consulting Physician (Otolaryngology) Harriett Sine, MD as Consulting Physician (Dermatology) Fanny Dance, DDS as Referring Physician (Dentistry) Debbora Dus, [facility] as Pharmacist (Pharmacist)  Recent office visits: 09/19/20 - PCP - BP high, increase carvedilol to 2 tablets in AM and 1 tablet in the evening. B12 shot today, then start vitamin B12 [phone] mcg daily OTC. Thyroid dose was a bit low - increase to 26mg daily. RTC 3 months.  Recent consult visits: None in previous 6 months  Hospital visits: None in previous 6 months  Objective:  Lab Results  Component Value Date   CREATININE 0.85 09/12/2020   BUN 20 09/12/2020   GFR 80.50 09/12/2020   GFRNONAA 90 (L) 02/26/2013   GFRAA >90 02/26/2013   NA 138 09/12/2020   K 4.7 09/12/2020   CALCIUM 10.2 09/12/2020   CO2 33 (H) 09/12/2020    Lab Results  Component Value Date/Time   HGBA1C 7.3 (H) 09/12/2020 08:03 AM   HGBA1C 6.3 (A) 05/10/2020 09:51 AM   HGBA1C 7.4 (H) 09/02/2019 09:03 AM   GFR 80.50 09/12/2020 08:03 AM   GFR 92.50 09/02/2019 09:03 AM   MICROALBUR <0.7 06/02/2015 08:28 AM   MICROALBUR 1.0 05/25/2013 08:39 AM     Last diabetic Eye exam:  Lab Results  Component Value Date/Time   HMDIABEYEEXA No Retinopathy 03/21/2020 12:00 AM    Last diabetic Foot exam: UTD   Lab Results  Component Value Date   CHOL 155 09/12/2020   HDL 56.10 09/12/2020   LDLCALC 77 09/12/2020   LDLDIRECT 84.5 05/26/2012   TRIG 107.0 09/12/2020   CHOLHDL 3 09/12/2020    Hepatic Function Latest Ref Rng & Units 09/12/2020 07/05/2020 05/05/2020  Total Protein 6.0 - 8.3 g/dL 7.2 7.2 7.3  Albumin 3.5 - 5.2 g/dL 4.6 4.6 4.6  AST 0 - 37 U/L 38(H) 30 56(H)  ALT 0 - 53 U/L 63(H) 64(H) 46  Alk Phosphatase 39 - 117 U/L 101 104 105  Total Bilirubin 0.2 - 1.2 mg/dL 0.6 0.5 0.6  Bilirubin, Direct 0.0 - 0.3 mg/dL - 0.1 0.2    Lab Results  Component Value Date/Time   TSH 5.94 (H) 09/12/2020 08:03 AM   TSH 3.64 09/02/2019 09:03 AM   FREET4 1.00 09/12/2020 08:03 AM   FREET4 1.04 05/07/2019 11:46 AM    CBC Latest Ref Rng & Units 09/02/2019 05/07/2019 01/01/2019  WBC 4.0 - 10.5 K/uL 4.2 4.7 4.5  Hemoglobin 13.0 - 17.0 g/dL 13.4 13.3 12.9(L)  Hematocrit 39.0 - 52.0 % 40.8 39.4 37.6(L)  Platelets 150.0 - 400.0 K/uL 207.0 208.0 188.0    No results found for: VD25OH  Clinical ASCVD: Yes  The ASCVD Risk score (Mikey BussingDC Jr., et al., 2013) failed to  calculate for the following reasons:   The 2013 ASCVD risk score is only valid for ages 21 to 43    Depression screen PHQ 2/9 09/08/2020 09/06/2019 08/28/2018  Decreased Interest 2 0 2  Down, Depressed, Hopeless _0 PHQ - 2 Score _1 Altered sleeping 0 1 2  Tired, decreased energy 0 1 3  Change in appetite 0 1 1  Feeling bad or failure about yourself  0 0 3  Trouble concentrating 0 0 3  Moving slowly or fidgety/restless 0 0 0  Suicidal thoughts 0 0 0  PHQ-9 Score _2 Difficult doing work/chores Somewhat difficult - Not difficult at all    Social History   Tobacco Use  Smoking Status Former Smoker  . Packs/day: 2.00  . Years: 30.00  . Pack years: 60.00  . Types: Cigarettes   . Start date: 07/29/1962  . Quit date: 07/29/1992  . Years since quitting: 28.2  Smokeless Tobacco Never Used   BP Readings from Last 3 Encounters:  09/19/20 (!) 164/64  09/08/20 (!) 159/76  05/10/20 (!) 150/66   Pulse Readings from Last 3 Encounters:  09/19/20 67  09/08/20 (!) 53  05/10/20 66   Wt Readings from Last 3 Encounters:  09/19/20 129 lb (58.5 kg)  05/10/20 128 lb 3 oz (58.1 kg)  03/22/20 127 lb (57.6 kg)    Assessment/Interventions: Review of patient past medical history, allergies, medications, health status, including review of consultants reports, laboratory and other test data, was performed as part of comprehensive evaluation and provision of chronic care management services.   SDOH:  (Social Determinants of Health) assessments and interventions performed: Yes SDOH Interventions   Flowsheet Row Most Recent Value  SDOH Interventions   Financial Strain Interventions Intervention Not Indicated     CCM Care Plan  Allergies  Allergen Reactions  . Amlodipine Other (See Comments)    transaminitis    Medications Reviewed Today    Reviewed by Debbora Dus, [facility] (Pharmacist) on 10/03/20 at [place] List Status: <None>  Medication Order Taking? Sig Documenting Provider Last Dose Status Informant  atorvastatin (LIPITOR) 10 MG tablet [id number] Yes Take 0.5 tablets (5 mg total) by mouth daily. [name], MD Taking Active   carvedilol (COREG) 3.125 MG tablet [id number] Yes Take 2 tablets (6.25 mg total) by mouth every morning AND 1 tablet (3.125 mg total) at bedtime. [name], MD Taking Active   Cyanocobalamin (B-12) 1000 MCG SUBL [id number] Yes Place 1 tablet under the tongue daily. [name], MD Taking Active   famotidine (PEPCID) 20 MG tablet [id number] Yes TAKE 1 TABLET BY MOUTH EVERYDAY AT BEDTIME Milus Banister, MD Taking Active   ferrous sulfate 325 (65 FE) MG tablet [id number] Yes Take 1 tablet (325 mg total) by mouth every other day.  [name], MD Taking Active   finasteride (PROSCAR) 5 MG tablet [id number] Yes Take 1 tablet (5 mg total) by mouth every evening. [name], MD Taking Active   levothyroxine (SYNTHROID) 50 MCG tablet [id number] Yes Take 1 tablet (50 mcg total) by mouth daily before breakfast. [name], MD Taking Active   metFORMIN (GLUCOPHAGE-XR) 500 MG 24 hr tablet [id number] Yes Take 2 tablets (1,000 mg total) by mouth 2 (two) times daily. [name], MD Taking Active   Multiple Vitamin (MULTIVITAMIN WITH MINERALS) TABS [id number] Yes Take 1 tablet by mouth daily. [provider] Taking Active Self  omeprazole (PRILOSEC) 40 MG capsule [id number] Yes Take  1 capsule (40 mg total) by mouth daily. [name], MD Taking Active   polyethylene glycol powder [facility]) 17 GM/SCOOP powder [id number] Yes Take 8.5 g by mouth daily as needed for moderate constipation. Hold for diarrhea [name], MD Taking Active   quinapril (ACCUPRIL) 20 MG tablet [id number] Yes Take 1 tablet (20 mg total) by mouth at bedtime. [name], MD Taking Active   quinapril-hydrochlorothiazide (ACCURETIC) 20-25 MG tablet [id number] Yes Take 1 tablet by mouth daily. [name], MD Taking Active   sertraline (ZOLOFT) 50 MG tablet [id number] Yes Take 1 tablet (50 mg total) by mouth daily. [name], MD Taking Active           Patient Active Problem List   Diagnosis Date Noted  . Low serum vitamin B12 09/20/2020  . Urinary urgency 09/20/2020  . Hoarseness 05/10/2020  . Constipation 02/28/2020  . Carotid stenosis 09/12/2019  . Interstitial lung disease ([place]) 09/09/2019  . Dysphagia 09/06/2019  . Nausea 09/06/2019  . Bilateral leg edema 02/09/2019  . Iron deficiency anemia 08/30/2017  . Borderline hypothyroidism 08/30/2017  . Kidney cyst, acquired 05/26/2017  . Chronic lower back pain 06/24/2016  . Serous otitis media with rupture of tympanic membrane 03/28/2016  .  Abdominal aortic atherosclerosis ([place]) 06/05/2014  . Advanced care planning/counseling discussion 06/02/2014  . Alcoholic hepatitis without ascites 06/02/2014  . Health maintenance examination 06/02/2014  . Medicare annual wellness visit, subsequent 06/01/2013  . Weight loss 01/11/2013  . Transaminitis 12/01/2012  . Right shoulder pain 11/30/2012  . Schwannoma of spinal cord ([place]) 11/26/2012  . DIVERTICULOSIS OF COLON 12/22/2008  . Hyperlipidemia associated with type 2 diabetes mellitus ([place]) 01/10/2007  . Alcohol use disorder, severe, in sustained remission ([place]) 01/10/2007  . Ex-smoker 01/10/2007  . MDD (major depressive disorder), recurrent episode, moderate ([place]) 01/10/2007  . COPD (chronic obstructive pulmonary disease) ([place]) 01/10/2007  . Benign prostatic hyperplasia 01/10/2007  . Type 2 diabetes mellitus with other specified complication ([place]) 84/13/2440  . Essential hypertension 01/09/2007    Immunization History  Administered Date(s) Administered  . Fluad Quad(high Dose 65+) 05/07/2019, 05/10/2020  . Influenza Split 05/08/2011, 05/29/2012  . Influenza Whole 04/28/2006, 06/17/2007, 05/15/2008, 05/07/2010  . Influenza,inj,Quad PF,6+ Mos 06/02/2014, 06/07/2015  . Influenza,inj,quad, With Preservative 04/23/2017, 05/01/2018  . Influenza-Unspecified 05/17/2013, 04/15/2016  . PFIZER(Purple Top)SARS-COV-2 Vaccination 08/13/2019, 09/02/2019, 04/28/2020  . Pneumococcal Conjugate-13 06/02/2014  . Pneumococcal Polysaccharide-23 06/29/2003  . Td 12/28/1998, 08/28/2009  . Zoster 04/03/2011  . Zoster Recombinat (Shingrix) 11/06/2017, 02/25/2018    Conditions to be addressed/monitored:  Hypertension and Diabetes  Care Plan : [place]  Updates made by Debbora Dus, [facility] since 10/05/2020 12:00 AM    Problem: CHL AMB "PATIENT-SPECIFIC PROBLEM"     Goal: Patient Stated   Start Date: 10/03/2020  Priority: High  Note:   Current Barriers:  . Unable to maintain control  of blood pressure  Pharmacist Clinical Goal(s):  Marland Kitchen Over the next 90 days, patient will achieve adherence to monitoring guidelines and medication adherence to achieve therapeutic efficacy . contact provider office for questions/concerns as evidenced notation of same in electronic health record through collaboration with PharmD and provider.   Interventions: . 1:1 collaboration with [name], MD regarding development and update of comprehensive plan of care as evidenced by provider attestation and co-signature . Inter-disciplinary care team collaboration (see longitudinal plan of care) . Comprehensive medication review performed; medication list updated in electronic medical record  Hypertension (BP goal < 150/90 mmHg) -Controlled -  Average BP is within goal.  -Pt has a lot of BP fluctuations. Recent balance concerns so do not want to over treat. Several falls outdoors while squatting/picking weeds,etc. Feels unsteady on his feet, especially with quick turns or standing suddenly.  -Current treatment: . Quinapril 20 mg - 1 tablet daily  . Quinapril-HCTZ 20-25 mg - 1 tablet daily . Carvedilol 3.125 mg - 2 tablets in the morning and 1 tablet in the evening at bedtime (increased 09/19/20) -Medications previously tried: none -Current home readings: checking twice daily varying times and rechecks 2-3x. Reports higher in AM than afternoon. Does not record dates/times.  - 143/54, 126/54, 169/73, 140/66, 168/79, 49, 177/76, 110/65 (last night), 142/58, 144/61, 147/83, 131/60, 137/57, 147/70 -Current exercise habits: Walks daily around the block. Mows his own yard. -Denies hypotensive/hypertensive symptoms -Educated on Proper BP monitoring technique; -Counseled to monitor BP at home once daily, same time each day, document, and provide log at future appointments -Recommended to continue current medication; Recommend he not stress over his BP - this may increase BP. Check only once daily and  while resting. Follow up next month with adherence call.  Diabetes (A1c goal < 7.5%) -Controlled - with recent increase in A1c to 7.3%, increased A1c goal to < 7.5% considering age/comorbidities. -Current medications: Marland Kitchen Metformin 500 mg - 2 tablets twice daily  -Medications previously tried: none reported -Current home glucose readings . fasting glucose: 120-130s (improving) . post prandial glucose: none -Denies hypoglycemic/hyperglycemic symptoms -Current meal patterns: reports eating more sweets around holidays but working on diet now -Recommended to continue current medication  Patient Goals/Self-Care Activities . Over the next 90 days, patient will:  - check glucose once daily, document, and provide at future appointments check blood pressure once daily, document, and provide at future appointments  Follow Up Plan: The care management team will reach out to the patient again over the next 60  days.   Medication Assistance: None required.  Patient affirms current coverage meets needs.     Medication Delivery Review:  Last adherence delivery included: Adherence packs, 30 DS 09/25/20 (pack start 10/01/20- 10/30/20)  Atorvastatin 10 mg - 1/2 tablet daily (1/2 evening meal)   Carvedilol 3.125- 3 tablets daily (2 breakfast and 1 evening meal) *increased dose 2/22*  Famotidine 20 mg- 1 tablet daily (1 bedtime)    Finasteride 5 mg 1 tablet daily (1 evening meal)  Levothyroxine 50 mcg- 1 tablet daily (1 before breakfast) *increased dose 2/22*  Metformin ER 500 mg- 2 tablets twice daily (2 breakfast and 2 evening meal)   Omeprazole 40 mg- 1 tablet daily (1 breakfast)   Quinapril/HCTZ 20/25 mg - 1 tablet daily (1 breakfast)   Quinapril 20 mg - 1 tablet daily (1 evening meal)    Sertraline 50 mg - 1 tablet daily (1 breakfast)  Patient is due for next delivery on Thursday, 10/26/20. Patient has 6 extra days from previous packaging. Will push back delivery date to 11/01/20. No recent  medication changes.   Atorvastatin 10 mg - 1/2 tablet daily (1/2 evening meal)   Carvedilol 3.125- 3 tablets daily (2 breakfast and 1 evening meal)  Famotidine 20 mg- 1 tablet daily (1 bedtime)    Finasteride 5 mg 1 tablet daily (1 evening meal)  Levothyroxine 50 mcg- 1 tablet daily (1 before breakfast)   Metformin ER 500 mg- 2 tablets twice daily (2 breakfast and 2 evening meal)   Omeprazole 40 mg- 1 tablet daily (1 breakfast)   Quinapril/HCTZ 20/25 mg -  1 tablet daily (1 breakfast)   Quinapril 20 mg - 1 tablet daily (1 evening meal)    Sertraline 50 mg - 1 tablet daily (1 breakfast)  Patient declined the following medications this month:  OTC B12 1000 mcg - 1 SL tablet daily (1 breakfast)  #230 on hand  (would like in packs once finished current supply)  OTC Ferrous Sulfate 374m - 1 tablet daily (breakfast) #60 on hand  (would like in packs once finished current supply)  OTC Bayer Mens OneADay multivitamin - 1 tablet daily (breakfast) #280 on hand  (would like in packs once finished current supply)  Polyethylene glycol getting OTC PRN   Denies missed doses of medications.  Patient[place] [facility] [place] [address] [address] [address] [id number]: [phone]: [phone] Uses pill box? Using Adherence packaging Pt endorses 100% compliance  Care Plan and Follow Up Patient Decision:  Patient agrees to Care Plan and Follow-up.  Plan: Telephone follow up appointment with care management team member scheduled for:  6 month medication review  MDebbora Dus PharmD Clinical Pharmacist [phone]

## 2020-10-05 NOTE — Progress Notes (Signed)
Encounter details: CCM Time Spent       Value Time User   Time spent with patient (minutes)  43 10/05/2020  3:02 PM Debbora Dus, [facility]   Time spent performing Chart review  30 10/05/2020  3:02 PM Debbora Dus, [facility]   Total time (minutes)  73 10/05/2020  3:02 PM Debbora Dus, RPH      Moderate to High Complex Decision Making       Value Time User   Moderate to High complex decision making  Yes 10/05/2020  3:02 PM Debbora Dus, [facility]      CCM Services: This encounter meets complex CCM services and moderate to high decision making.  Prior to outreach and patient consent for Chronic Care Management, I referred this patient for services after reviewing the nominated patient list or from a personal encounter with the patient.  I have personally reviewed this encounter including the documentation in this note and have collaborated with the care management provider regarding care management and care coordination activities to include development and update of the comprehensive care plan. I am certifying that I agree with the content of this note and encounter as supervising physician.

## 2020-10-05 NOTE — Patient Instructions (Signed)
Dear Justin Benson,  Below is a summary of the goals we discussed during our follow up appointment on October 03, 2020. Please contact me anytime with questions or concerns.   Visit Information  Patient Care Plan: CCM Pharmacy Care Plan    Problem Identified: CHL AMB "PATIENT-SPECIFIC PROBLEM"     Goal: Patient Stated   Start Date: 10/03/2020  Priority: High  Note:   Current Barriers:  . Unable to maintain control of blood pressure  Pharmacist Clinical Goal(s):  Marland Kitchen Over the next 90 days, patient will achieve adherence to monitoring guidelines and medication adherence to achieve therapeutic efficacy . contact provider office for questions/concerns as evidenced notation of same in electronic health record through collaboration with PharmD and provider.   Interventions: . 1:1 collaboration with [name], MD regarding development and update of comprehensive plan of care as evidenced by provider attestation and co-signature . Inter-disciplinary care team collaboration (see longitudinal plan of care) . Comprehensive medication review performed; medication list updated in electronic medical record  Hypertension (BP goal < 150/90 mmHg) -Controlled - Average BP is within goal.  -Pt has a lot of BP fluctuations. Recent balance concerns so do not want to over treat. Several falls outdoors while squatting/picking weeds,etc. Feels unsteady on his feet, especially with quick turns or standing suddenly.  -Current treatment: . Quinapril 20 mg - 1 tablet daily  . Quinapril-HCTZ 20-25 mg - 1 tablet daily . Carvedilol 3.125 mg - 2 tablets in the morning and 1 tablet in the evening at bedtime (increased 09/19/20) -Medications previously tried: none -Current home readings: checking twice daily varying times and rechecks 2-3x. Reports higher in AM than afternoon. Does not record dates/times.  - 143/54, 126/54, 169/73, 140/66, 168/79, 49, 177/76, 110/65 (last night), 142/58, 144/61, 147/83, 131/60,  137/57, 147/70 -Current exercise habits: Walks daily around the block. Mows his own yard. -Denies hypotensive/hypertensive symptoms -Educated on Proper BP monitoring technique; -Counseled to monitor BP at home once daily, same time each day, document, and provide log at future appointments -Recommended to continue current medication; Recommend he not stress over his BP - this may increase BP. Check only once daily and while resting. Follow up next month with adherence call.  Diabetes (A1c goal < 7.5%) -Controlled - with recent increase in A1c to 7.3%, increased A1c goal to < 7.5% considering age/comorbidities. -Current medications: Marland Kitchen Metformin 500 mg - 2 tablets twice daily  -Medications previously tried: none reported -Current home glucose readings . fasting glucose: 120-130s (improving) . post prandial glucose: none -Denies hypoglycemic/hyperglycemic symptoms -Current meal patterns: reports eating more sweets around holidays but working on diet now -Recommended to continue current medication  Patient Goals/Self-Care Activities . Over the next 90 days, patient will:  - check glucose once daily, document, and provide at future appointments check blood pressure once daily, document, and provide at future appointments  Follow Up Plan: The care management team will reach out to the patient again over the next 60  days.   Medication Assistance: None required.  Patient affirms current coverage meets needs.      The patient verbalized understanding of instructions, educational materials, and care plan provided today and agreed to receive a mailed copy of patient instructions, educational materials, and care plan.  The pharmacy team will reach out to the patient again over the next 60 days.   Debbora Dus, PharmD Clinical Pharmacist [facility] at [facility] [phone]   Blood Pressure Record Sheet To take your blood pressure,  you will need a blood pressure machine.  You can buy a blood pressure machine (blood pressure monitor) at your clinic, drug store, or online. When choosing one, consider:  An automatic monitor that has an arm cuff.  A cuff that wraps snugly around your upper arm. You should be able to fit only one finger between your arm and the cuff.  A device that stores blood pressure reading results.  Do not choose a monitor that measures your blood pressure from your wrist or finger. Follow your health care provider's instructions for how to take your blood pressure. To use this form:  Get one reading in the morning (a.m.) before you take any medicines.  Get one reading in the evening (p.m.) before supper.  Take at least 2 readings with each blood pressure check. This makes sure the results are correct. Wait 1-2 minutes between measurements.  Write down the results in the spaces on this form.  Repeat this once a week, or as told by your health care provider.  Make a follow-up appointment with your health care provider to discuss the results. Blood pressure log Date: _______________________  a.m. _____________________(1st reading) _____________________(2nd reading)  p.m. _____________________(1st reading) _____________________(2nd reading) Date: _______________________  a.m. _____________________(1st reading) _____________________(2nd reading)  p.m. _____________________(1st reading) _____________________(2nd reading) Date: _______________________  a.m. _____________________(1st reading) _____________________(2nd reading)  p.m. _____________________(1st reading) _____________________(2nd reading) Date: _______________________  a.m. _____________________(1st reading) _____________________(2nd reading)  p.m. _____________________(1st reading) _____________________(2nd reading) Date: _______________________  a.m. _____________________(1st reading) _____________________(2nd reading)  p.m. _____________________(1st reading)  _____________________(2nd reading) This information is not intended to replace advice given to you by your health care provider. Make sure you discuss any questions you have with your health care provider. Document Revised: 11/03/2019 Document Reviewed: 11/03/2019 Elsevier Patient Education  2021 [employer].

## 2020-10-15 ENCOUNTER — Other Ambulatory Visit: Payer: Self-pay | Admitting: Gastroenterology

## 2020-10-27 DIAGNOSIS — D044 Carcinoma in situ of skin of scalp and neck: Secondary | ICD-10-CM

## 2020-10-27 HISTORY — DX: Carcinoma in situ of skin of scalp and neck: D04.4

## 2020-11-03 ENCOUNTER — Telehealth: Payer: Self-pay

## 2020-11-03 NOTE — Chronic Care Management (AMB) (Addendum)
Chronic Care Management Pharmacy Assistant   Name: Justin Benson  MRN: [id number] DOB: [birth year]  Reason for Encounter: Disease State- Hypertension   Conditions to be addressed/monitored: HTN  Recent office visits:  None since last CCM visit  Recent consult visits:  None since last CCM visit  Hospital visits:  None in previous 6 months  Medications: Outpatient Encounter Medications as of 11/03/2020  Medication Sig   atorvastatin (LIPITOR) 10 MG tablet Take 0.5 tablets (5 mg total) by mouth daily.   carvedilol (COREG) 3.125 MG tablet Take 2 tablets (6.25 mg total) by mouth every morning AND 1 tablet (3.125 mg total) at bedtime.   Cyanocobalamin (B-12) 1000 MCG SUBL Place 1 tablet under the tongue daily.   famotidine (PEPCID) 20 MG tablet TAKE ONE TABLET BY MOUTH EVERYDAY AT BEDTIME   ferrous sulfate 325 (65 FE) MG tablet Take 1 tablet (325 mg total) by mouth every other day.   finasteride (PROSCAR) 5 MG tablet Take 1 tablet (5 mg total) by mouth every evening.   levothyroxine (SYNTHROID) 50 MCG tablet Take 1 tablet (50 mcg total) by mouth daily before breakfast.   metFORMIN (GLUCOPHAGE-XR) 500 MG 24 hr tablet Take 2 tablets (1,000 mg total) by mouth 2 (two) times daily.   Multiple Vitamin (MULTIVITAMIN WITH MINERALS) TABS Take 1 tablet by mouth daily.   omeprazole (PRILOSEC) 40 MG capsule Take 1 capsule (40 mg total) by mouth daily.   polyethylene glycol powder (GLYCOLAX/MIRALAX) 17 GM/SCOOP powder Take 8.5 g by mouth daily as needed for moderate constipation. Hold for diarrhea   quinapril (ACCUPRIL) 20 MG tablet Take 1 tablet (20 mg total) by mouth at bedtime.   quinapril-hydrochlorothiazide (ACCURETIC) 20-25 MG tablet Take 1 tablet by mouth daily.   sertraline (ZOLOFT) 50 MG tablet Take 1 tablet (50 mg total) by mouth daily.   No facility-administered encounter medications on file as of 11/03/2020.     Recent Relevant Labs: Lab Results  Component Value Date/Time    HGBA1C 7.3 (H) 09/12/2020 08:03 AM   HGBA1C 6.3 (A) 05/10/2020 09:51 AM   HGBA1C 7.4 (H) 09/02/2019 09:03 AM   MICROALBUR <0.7 06/02/2015 08:28 AM   MICROALBUR 1.0 05/25/2013 08:39 AM    Kidney Function Lab Results  Component Value Date/Time   CREATININE 0.85 09/12/2020 08:03 AM   CREATININE 0.80 09/02/2019 09:03 AM   GFR 80.50 09/12/2020 08:03 AM   GFRNONAA 90 (L) 02/26/2013 11:00 AM   GFRAA >90 02/26/2013 11:00 AM    Recent Office Vitals: BP Readings from Last 3 Encounters:  09/19/20 (!) 164/64  09/08/20 (!) 159/76  05/10/20 (!) 150/66   Pulse Readings from Last 3 Encounters:  09/19/20 67  09/08/20 (!) 53  05/10/20 66    Wt Readings from Last 3 Encounters:  09/19/20 129 lb (58.5 kg)  05/10/20 128 lb 3 oz (58.1 kg)  03/22/20 127 lb (57.6 kg)     Kidney Function Lab Results  Component Value Date/Time   CREATININE 0.85 09/12/2020 08:03 AM   CREATININE 0.80 09/02/2019 09:03 AM   GFR 80.50 09/12/2020 08:03 AM   GFRNONAA 90 (L) 02/26/2013 11:00 AM   GFRAA >90 02/26/2013 11:00 AM    BMP Latest Ref Rng & Units 09/12/2020 09/02/2019 05/07/2019  Glucose 70 - 99 mg/dL 144(H) 151(H) 106(H)  BUN 6 - 23 mg/dL 20 20 20   Creatinine 0.40 - 1.50 mg/dL 0.85 0.80 0.75  Sodium 135 - 145 mEq/L 138 137 138  Potassium 3.5 - 5.1 mEq/L  4.7 4.5 4.4  Chloride 96 - 112 mEq/L 98 100 99  CO2 19 - 32 mEq/L 33(H) 33(H) 33(H)  Calcium 8.4 - 10.5 mg/dL 10.2 9.5 9.9   Current antihypertensive regimen:  Quinapril 20 mg - 1 tablet daily  Quinapril-HCTZ 20-25 mg - 1 tablet daily Carvedilol 3.125 mg - 2 tablets in the morning and 1 tablet in the evening at bedtime (increased 09/19/20)  Patient verbally confirms he is taking the above medications as directed. Yes  How often are you checking your Blood Pressure? daily  he checks his blood pressure in the morning after taking his medication.  Current home BP readings:   DATE:             BP                PULSE  10/05/20 167/58  59  10/06/20 124/47  64  10/07/20 129/50  56  10/08/20 145/58  58    Done at 1:45 pm  10/09/20 132/52  56  10/10/20 100/52  64  10/11/20 159/72  53  10/12/20 125/57  65  10/13/20 130/55  66  10/14/20 142/62  53  10/15/20 155/57  55  10/16/20 144/64  52  10/17/20 148/73  51  10/18/20 153/59  54  10/19/20 142/55  52  10/20/20 117/50  63  10/21/20 135/62  62  10/22/20 130/62  60  10/23/20 134/65  55  10/24/20 117/45  65  10/25/20 130/61  64  10/26/20 139/62  49  10/27/20  146/58  54  10/28/20  157/90  55  10/29/20  139/54  53  10/30/20  147/65  60  10/31/20  130/57  61  11/01/20  154/61  55     Any readings above 180/120? No If yes any symptoms of hypertensive emergency? patient denies any symptoms of high blood pressure   What recent interventions/DTPs have been made by any provider to improve Blood Pressure control since last CPP Visit:  Recommended to continue current medication; Recommend he not stress over his BP - this may increase BP. Check only once daily and while resting.   Any recent hospitalizations or ED visits since last visit with CPP? No  What diet changes have been made to improve Blood Pressure Control?  N/A  What exercise is being done to improve your Blood Pressure Control? Walks daily around the block. Mows his own yard.  Adherence Review: Is the patient currently on ACE/ARB medication? Yes Does the patient have >5 day gap between last estimated fill dates? No   Star Rating Drugs:  Medication:   Last Fill: Day Supply Atorvastatin 10 mg  10/28/20  30 Metformin XR 500 mg             10/28/20  30 Quinapril 20 mg  10/28/20  30 Quinapril-hctz 20-25 mg 10/28/20  30   Patient notes fasting blood sugar this morning was 147. States he feels dizzy at times but this is mostly when he gets up.    Follow-Up:  Pharmacist Review  Debbora Dus, CPP notified  Margaretmary Dys, [place] Assistant [phone]  I have reviewed the care management and  care coordination activities outlined in this encounter and I am certifying that I agree with the content of this note. Blood pressure has improved. No further action required. Continue to review BP monthly.  Debbora Dus, PharmD Clinical Pharmacist [facility] at [facility] [phone]

## 2020-11-06 ENCOUNTER — Encounter: Payer: Self-pay | Admitting: Family Medicine

## 2020-11-14 ENCOUNTER — Encounter: Payer: Self-pay | Admitting: Family Medicine

## 2020-11-22 ENCOUNTER — Telehealth: Payer: Self-pay

## 2020-11-22 NOTE — Chronic Care Management (AMB) (Addendum)
Chronic Care Management Pharmacy Assistant   Name: Justin Benson  MRN: [id number] DOB: 03-Oct-1934  Reason for Encounter: Medication Adherence and Delivery Coordination  Recent office visits:  None since last CCM contact  Recent consult visits:  None since last CCM contact  Hospital visits:  None in previous 6 months  Medications: Outpatient Encounter Medications as of 11/22/2020  Medication Sig   atorvastatin (LIPITOR) 10 MG tablet Take 0.5 tablets (5 mg total) by mouth daily.   carvedilol (COREG) 3.125 MG tablet Take 2 tablets (6.25 mg total) by mouth every morning AND 1 tablet (3.125 mg total) at bedtime.   Cyanocobalamin (B-12) 1000 MCG SUBL Place 1 tablet under the tongue daily.   famotidine (PEPCID) 20 MG tablet TAKE ONE TABLET BY MOUTH EVERYDAY AT BEDTIME   ferrous sulfate 325 (65 FE) MG tablet Take 1 tablet (325 mg total) by mouth every other day.   finasteride (PROSCAR) 5 MG tablet Take 1 tablet (5 mg total) by mouth every evening.   levothyroxine (SYNTHROID) 50 MCG tablet Take 1 tablet (50 mcg total) by mouth daily before breakfast.   metFORMIN (GLUCOPHAGE-XR) 500 MG 24 hr tablet Take 2 tablets (1,000 mg total) by mouth 2 (two) times daily.   Multiple Vitamin (MULTIVITAMIN WITH MINERALS) TABS Take 1 tablet by mouth daily.   omeprazole (PRILOSEC) 40 MG capsule Take 1 capsule (40 mg total) by mouth daily.   polyethylene glycol powder (GLYCOLAX/MIRALAX) 17 GM/SCOOP powder Take 8.5 g by mouth daily as needed for moderate constipation. Hold for diarrhea   quinapril (ACCUPRIL) 20 MG tablet Take 1 tablet (20 mg total) by mouth at bedtime.   quinapril-hydrochlorothiazide (ACCURETIC) 20-25 MG tablet Take 1 tablet by mouth daily.   sertraline (ZOLOFT) 50 MG tablet Take 1 tablet (50 mg total) by mouth daily.   No facility-administered encounter medications on file as of 11/22/2020.   BP Readings from Last 3 Encounters:  09/19/20 (!) 164/64  09/08/20 (!) 159/76  05/10/20 (!)  150/66    Lab Results  Component Value Date   HGBA1C 7.3 (H) 09/12/2020    Attempted contact with patient 4/27and 4/28 unsuccessful outreach  No OVs, Consults, or hospital visits since last care coordination call / Pharmacist visit. No medication changes indicated  Patient obtains medications through Adherence Packaging  30 Days   Last adherence delivery date:  10/28/20  Patient is due for next adherence delivery on: 12/04/20  This delivery to include: Adherence Packaging  30 Days  Packs: Quinapril 20 mg 1 tablet daily (bedtime) Omeprazole 40 mg 1 tablet daily (breakfast) Levothyroxine 50 mcg 1 tablet daily (before breakfast) Quinapril/HCTZ 20-25 mg 1 tablet twice daily (breakfast, evening meal) Carvedilol 3.125 mg 3 tablets daily (2 tablets breakfast, 1 tablet evening meal) Sertraline 50 mg 1 tablet daily (breakfast) Finasteride 5 mg 1 tablet daily (evening meal) Metformin ER 500 mg 4 tablets daily (2 breakfast, 2 evening meal) Atorvastatin 10 mg 0.5 tablet daily (evening meal) Famotidine 20 mg 1 tablet daily (bedtime)  Not due for the following medications this month: OTC Ferrous sulfate 325 mg 1 tablet every other day (bedtime) OTC Mens multivitamin- 1 tablet daily (breakfast) OTC Polyethylene glycol 3350- PRN  Patient does not need refills on any medications.  Confirmed delivery date of 12/04/20, the pharmacy will contact him the morning of delivery.  Follow-Up:  Comptroller and Pharmacist Review  Debbora Dus, CPP notified  Margaretmary Dys, [place] Pharmacy Assistant [phone]  I have reviewed  the care management and care coordination activities outlined in this encounter and I am certifying that I agree with the content of this note. No further action required.  Debbora Dus, PharmD Clinical Pharmacist [facility] at [facility] [phone]

## 2020-12-18 ENCOUNTER — Other Ambulatory Visit: Payer: Medicare Other

## 2020-12-18 ENCOUNTER — Other Ambulatory Visit: Payer: Self-pay

## 2020-12-18 ENCOUNTER — Ambulatory Visit (INDEPENDENT_AMBULATORY_CARE_PROVIDER_SITE_OTHER): Payer: Medicare Other | Admitting: Family Medicine

## 2020-12-18 ENCOUNTER — Encounter: Payer: Self-pay | Admitting: Family Medicine

## 2020-12-18 VITALS — BP 150/60 | HR 79 | Temp 97.7°F | Ht 66.5 in | Wt 129.4 lb

## 2020-12-18 DIAGNOSIS — R110 Nausea: Secondary | ICD-10-CM

## 2020-12-18 DIAGNOSIS — E039 Hypothyroidism, unspecified: Secondary | ICD-10-CM

## 2020-12-18 DIAGNOSIS — E538 Deficiency of other specified B group vitamins: Secondary | ICD-10-CM

## 2020-12-18 DIAGNOSIS — F1021 Alcohol dependence, in remission: Secondary | ICD-10-CM | POA: Diagnosis not present

## 2020-12-18 DIAGNOSIS — I10 Essential (primary) hypertension: Secondary | ICD-10-CM | POA: Diagnosis not present

## 2020-12-18 DIAGNOSIS — E785 Hyperlipidemia, unspecified: Secondary | ICD-10-CM

## 2020-12-18 DIAGNOSIS — E1169 Type 2 diabetes mellitus with other specified complication: Secondary | ICD-10-CM | POA: Diagnosis not present

## 2020-12-18 DIAGNOSIS — F331 Major depressive disorder, recurrent, moderate: Secondary | ICD-10-CM

## 2020-12-18 DIAGNOSIS — R7401 Elevation of levels of liver transaminase levels: Secondary | ICD-10-CM

## 2020-12-18 DIAGNOSIS — J849 Interstitial pulmonary disease, unspecified: Secondary | ICD-10-CM

## 2020-12-18 DIAGNOSIS — K5900 Constipation, unspecified: Secondary | ICD-10-CM

## 2020-12-18 DIAGNOSIS — J449 Chronic obstructive pulmonary disease, unspecified: Secondary | ICD-10-CM

## 2020-12-18 LAB — CBC WITH DIFFERENTIAL/PLATELET
Basophils Absolute: 0 10*3/uL (ref 0.0–0.1)
Basophils Relative: 0.9 % (ref 0.0–3.0)
Eosinophils Absolute: 0.1 10*3/uL (ref 0.0–0.7)
Eosinophils Relative: 2.2 % (ref 0.0–5.0)
HCT: 38.2 % — ABNORMAL LOW (ref 39.0–52.0)
Hemoglobin: 13 g/dL (ref 13.0–17.0)
Lymphocytes Relative: 14.8 % (ref 12.0–46.0)
Lymphs Abs: 0.7 10*3/uL (ref 0.7–4.0)
MCHC: 34 g/dL (ref 30.0–36.0)
MCV: 90.9 fl (ref 78.0–100.0)
Monocytes Absolute: 0.5 10*3/uL (ref 0.1–1.0)
Monocytes Relative: 9.9 % (ref 3.0–12.0)
Neutro Abs: 3.6 10*3/uL (ref 1.4–7.7)
Neutrophils Relative %: 72.2 % (ref 43.0–77.0)
Platelets: 224 10*3/uL (ref 150.0–400.0)
RBC: 4.2 Mil/uL — ABNORMAL LOW (ref 4.22–5.81)
RDW: 13.7 % (ref 11.5–15.5)
WBC: 4.9 10*3/uL (ref 4.0–10.5)

## 2020-12-18 LAB — COMPREHENSIVE METABOLIC PANEL
ALT: 186 U/L — ABNORMAL HIGH (ref 0–53)
AST: 166 U/L — ABNORMAL HIGH (ref 0–37)
Albumin: 4.4 g/dL (ref 3.5–5.2)
Alkaline Phosphatase: 119 U/L — ABNORMAL HIGH (ref 39–117)
BUN: 20 mg/dL (ref 6–23)
CO2: 29 mEq/L (ref 19–32)
Calcium: 9.3 mg/dL (ref 8.4–10.5)
Chloride: 98 mEq/L (ref 96–112)
Creatinine, Ser: 0.93 mg/dL (ref 0.40–1.50)
GFR: 75.93 mL/min (ref >60.00)
Glucose, Bld: 281 mg/dL — ABNORMAL HIGH (ref 70–99)
Potassium: 4.9 mEq/L (ref 3.5–5.1)
Sodium: 136 mEq/L (ref 135–145)
Total Bilirubin: 0.7 mg/dL (ref 0.2–1.2)
Total Protein: 6.7 g/dL (ref 6.0–8.3)

## 2020-12-18 LAB — LIPASE: Lipase: 63 U/L — ABNORMAL HIGH (ref 11.0–59.0)

## 2020-12-18 LAB — HEMOGLOBIN A1C: Hgb A1c MFr Bld: 7.6 % — ABNORMAL HIGH (ref 4.6–6.5)

## 2020-12-18 LAB — VITAMIN B12: Vitamin B-12: 796 pg/mL (ref 211–911)

## 2020-12-18 LAB — TSH: TSH: 2.21 u[IU]/mL (ref 0.35–4.50)

## 2020-12-18 NOTE — Assessment & Plan Note (Signed)
Chronic, ongoing. Previously improved with addition of pepcid to daily PPI.

## 2020-12-18 NOTE — Patient Instructions (Addendum)
Labs today Let us know how GI symptoms do.  Good to see you today. Return in 3 months if still here.  Good luck with upcoming move! Let us know when you find new doctor so we can send all your records.

## 2020-12-18 NOTE — Assessment & Plan Note (Signed)
Presumed amlodipine-driven transaminitis, saw GI. Now off amlodipine.

## 2020-12-18 NOTE — Assessment & Plan Note (Signed)
Noted on recent CXR. This has not been further evaluated as he has been largely asymptomatic from respiratory standpoint.

## 2020-12-18 NOTE — Progress Notes (Signed)
Patient ID: Justin Benson, male    DOB: 1935/07/06, 86 y.o.   MRN: [id number]  This visit was conducted in person.  BP (!) 150/60   Pulse 79   Temp 97.7 F (36.5 C) (Temporal)   Ht 5' 6.5" (1.689 m)   Wt 129 lb 6 oz (58.7 kg)   SpO2 96%   BMI 20.57 kg/m    CC: 3 mo HTN f/u visit  Subjective:   HPI: Justin Benson is a 86 y.o. male presenting on 12/18/2020 for Hypertension (Here for 3 mo f/u.  Has not had BP meds this AM.  Also, pt mentioned he will soon be moving to [place] with his daughter. ) and Discuss Medication (Wants to discuss taking Prilosec and omeprazole.)   Continues living at home alone, mowing lawn with push mower. Notes decreased stamina and easy fatiguability - ie walking around his yard struggles at the last part which is on an incline. Sleeping 9-10 hours/night. Has been very isolated over the past 3 years. Planning to move to [facility] to be closer to daughter - planning to move into IL[facility].   HTN - Compliant with current antihypertensive regimen of carvedilol 6.25mg  in am and 3.125mg  in pm, quinapril 20mg  daily, quinapril/hctz 20/25mg  daily. Does check blood pressures at home: 110-150/60-90s (see CCM phone call 4/8). Yesterday's BP 295-284 systolic. No low blood pressure readings or symptoms of syncope. Occasional dizziness/imbalance when he wakes in the morning - has not checked BP when this happens. Denies vision changes, CP/tightness, SOB, leg swelling.  Occasional headaches.   Amlodipine previously caused transaminitis.  No further falls in the past 3 months.  Notes some recent increase in nausea - previously improved with nightly famotidine. He also continues omeprazole 40mg  daily. No true GERD symptoms. Upset stomach symptoms when he needs to use the bathroom as well as incomplete evacuation. He is on miralax 1/2 capful daily with benefit. He continues iron every other day. Continues vitamin B12 1053mcg daily.  Last visit we increased levothyroxine to 41mcg  daily.   DM - does regularly check sugars - staying elevated. Compliant with antihyperglycemic regimen which includes: metformin XR 1000mg  bid. Denies low sugars or hypoglycemic symptoms. Denies paresthesias. Last diabetic eye exam 02/2020. Glucometer brand: prodigy. DSME: completed remotely. Lab Results  Component Value Date   HGBA1C 7.3 (H) 09/12/2020   Diabetic Foot Exam - Simple   Simple Foot Form Diabetic Foot exam was performed with the following findings: Yes 12/18/2020  9:50 AM  Visual Inspection No deformities, no ulcerations, no other skin breakdown bilaterally: Yes Sensation Testing Intact to touch and monofilament testing bilaterally: Yes Pulse Check Posterior Tibialis and Dorsalis pulse intact bilaterally: Yes Comments    Lab Results  Component Value Date   MICROALBUR <0.7 06/02/2015       Relevant past medical, surgical, family and social history reviewed and updated as indicated. Interim medical history since our last visit reviewed. Allergies and medications reviewed and updated. Outpatient Medications Prior to Visit  Medication Sig Dispense Refill  . atorvastatin (LIPITOR) 10 MG tablet Take 0.5 tablets (5 mg total) by mouth daily. 45 tablet 3  . carvedilol (COREG) 3.125 MG tablet Take 2 tablets (6.25 mg total) by mouth every morning AND 1 tablet (3.125 mg total) at bedtime. 270 tablet 3  . Cyanocobalamin (B-12) 1000 MCG SUBL Place 1 tablet under the tongue daily.    . famotidine (PEPCID) 20 MG tablet TAKE ONE TABLET BY MOUTH EVERYDAY AT BEDTIME  30 tablet 3  . ferrous sulfate 325 (65 FE) MG tablet Take 1 tablet (325 mg total) by mouth every other day.  3  . finasteride (PROSCAR) 5 MG tablet Take 1 tablet (5 mg total) by mouth every evening. 90 tablet 3  . levothyroxine (SYNTHROID) 50 MCG tablet Take 1 tablet (50 mcg total) by mouth daily before breakfast. 90 tablet 3  . metFORMIN (GLUCOPHAGE-XR) 500 MG 24 hr tablet Take 2 tablets (1,000 mg total) by mouth 2 (two)  times daily. 360 tablet 3  . Multiple Vitamin (MULTIVITAMIN WITH MINERALS) TABS Take 1 tablet by mouth daily.    Marland Kitchen omeprazole (PRILOSEC) 40 MG capsule Take 1 capsule (40 mg total) by mouth daily. 90 capsule 3  . polyethylene glycol powder (GLYCOLAX/MIRALAX) 17 GM/SCOOP powder Take 8.5 g by mouth daily as needed for moderate constipation. Hold for diarrhea 3350 g 1  . quinapril (ACCUPRIL) 20 MG tablet Take 1 tablet (20 mg total) by mouth at bedtime. 90 tablet 3  . quinapril-hydrochlorothiazide (ACCURETIC) 20-25 MG tablet Take 1 tablet by mouth daily. 90 tablet 3  . sertraline (ZOLOFT) 50 MG tablet Take 1 tablet (50 mg total) by mouth daily. 90 tablet 3   No facility-administered medications prior to visit.     Per HPI unless specifically indicated in ROS section below Review of Systems Objective:  BP (!) 150/60   Pulse 79   Temp 97.7 F (36.5 C) (Temporal)   Ht 5' 6.5" (1.689 m)   Wt 129 lb 6 oz (58.7 kg)   SpO2 96%   BMI 20.57 kg/m   Wt Readings from Last 3 Encounters:  12/18/20 129 lb 6 oz (58.7 kg)  09/19/20 129 lb (58.5 kg)  05/10/20 128 lb 3 oz (58.1 kg)      Physical Exam Vitals and nursing note reviewed.  Constitutional:      General: He is not in acute distress.    Appearance: Normal appearance. He is not ill-appearing.     Comments: thin  HENT:     Head: Normocephalic and atraumatic.  Eyes:     General: No scleral icterus.    Extraocular Movements: Extraocular movements intact.     Conjunctiva/sclera: Conjunctivae normal.     Pupils: Pupils are equal, round, and reactive to light.  Cardiovascular:     Rate and Rhythm: Normal rate and regular rhythm.     Heart sounds: Normal heart sounds. No murmur heard.   Pulmonary:     Effort: Pulmonary effort is normal. No respiratory distress.     Breath sounds: Normal breath sounds. No wheezing or rales.  Abdominal:     General: Abdomen is flat. Bowel sounds are normal. There is no distension.     Palpations: Abdomen  is soft. There is no mass.     Tenderness: There is no abdominal tenderness. There is no right CVA tenderness, left CVA tenderness, guarding or rebound.     Hernia: No hernia is present.  Musculoskeletal:        General: Normal range of motion.     Cervical back: Normal range of motion and neck supple.     Right lower leg: No edema.     Left lower leg: No edema.     Comments: See HPI for foot exam if done  Lymphadenopathy:     Cervical: No cervical adenopathy.  Skin:    General: Skin is warm and dry.     Capillary Refill: Capillary refill takes less than 2 seconds.  Findings: No rash.  Neurological:     Mental Status: He is alert.  Psychiatric:        Mood and Affect: Mood normal.        Behavior: Behavior normal.       Results for orders placed or performed in visit on 09/19/20  POCT Urinalysis Dipstick (Automated)  Result Value Ref Range   Color, UA light yellow    Clarity, UA clear    Glucose, UA Positive (A) Negative   Bilirubin, UA negative    Ketones, UA negative    Spec Grav, UA 1.010 1.010 - 1.025   Blood, UA negative    pH, UA 7.0 5.0 - 8.0   Protein, UA Negative Negative   Urobilinogen, UA 0.2 0.2 or 1.0 E.U./dL   Nitrite, UA negative    Leukocytes, UA Negative Negative   Assessment & Plan:  This visit occurred during the SARS-CoV-2 public health emergency.  Safety protocols were in place, including screening questions prior to the visit, additional usage of staff PPE, and extensive cleaning of exam room while observing appropriate contact time as indicated for disinfecting solutions.   Problem List Items Addressed This Visit    Type 2 diabetes mellitus with other specified complication ([place])    Continues full dose metformin XR. This could contribute to nausea.  Update A1c.       Relevant Orders   Hemoglobin A1c   Hyperlipidemia associated with type 2 diabetes mellitus ([place])   Alcohol use disorder, severe, in sustained remission ([place])    Remains  abstinent since 08/25/2018.  Looking into getting established with AA locally in [place].       MDD (major depressive disorder), recurrent episode, moderate (HCC)    Continue sertraline 50mg  daily.  He is nervous about upcoming move and house-selling process. Hopeful daughter will come and help.      Essential hypertension - Primary    Chronic, overall adequate.  Continue current regimen of quinapril/hctz 20/25 + quinapril 20mg  daily as well as carvedilol 6.25mg  in am and 2.135mg  in pm.  Will not change regimen given endorsed dizziness      Relevant Orders   Comprehensive metabolic panel   COPD (chronic obstructive pulmonary disease) ([place])    Carries this diagnosis in long smoking history, not on respiratory medication.       Transaminitis    Presumed amlodipine-driven transaminitis, saw GI. Now off amlodipine.       Borderline hypothyroidism    Update TSH on higher levothyroxine dose.      Relevant Orders   TSH   Nausea    Chronic, ongoing. Previously improved with addition of pepcid to daily PPI.       Relevant Orders   CBC with Differential/Platelet   Lipase   Interstitial lung disease ([place])    Noted on recent CXR. This has not been further evaluated as he has been largely asymptomatic from respiratory standpoint.       Constipation    Improved with 1/2 capful miralax daily.  This could contribute to nausea and current GI symptoms.      Low serum vitamin B12    Continue daily 1068mcg oral B12 replacement, update b12 levels.       Relevant Orders   Vitamin B12       No orders of the defined types were placed in this encounter.  Orders Placed This Encounter  Procedures  . Comprehensive metabolic panel  . TSH  . Hemoglobin A1c  .  CBC with Differential/Platelet  . Lipase  . Vitamin B12    Patient Instructions  Labs today Let us know how GI symptoms do.  Good to see you today. Return in 3 months if still here.  Good luck with upcoming  move! Let us know when you find new doctor so we can send all your records.    Follow up plan: Return in about 3 months (around 03/20/2021), or if symptoms worsen or fail to improve, for follow up visit.  [name], MD

## 2020-12-18 NOTE — Assessment & Plan Note (Addendum)
Continues full dose metformin XR. This could contribute to nausea.  Update A1c.

## 2020-12-18 NOTE — Assessment & Plan Note (Addendum)
Continue daily 1069mcg oral B12 replacement, update b12 levels.

## 2020-12-18 NOTE — Assessment & Plan Note (Signed)
Continue sertraline 50mg  daily.  He is nervous about upcoming move and house-selling process. Hopeful daughter will come and help.

## 2020-12-18 NOTE — Assessment & Plan Note (Signed)
Update TSH on higher levothyroxine dose.  

## 2020-12-18 NOTE — Assessment & Plan Note (Addendum)
Chronic, overall adequate.  Continue current regimen of quinapril/hctz 20/25 + quinapril 20mg  daily as well as carvedilol 6.25mg  in am and 2.135mg  in pm.  Will not change regimen given endorsed dizziness

## 2020-12-18 NOTE — Assessment & Plan Note (Signed)
Carries this diagnosis in long smoking history, not on respiratory medication.

## 2020-12-18 NOTE — Assessment & Plan Note (Addendum)
Improved with 1/2 capful miralax daily.  This could contribute to nausea and current GI symptoms.

## 2020-12-18 NOTE — Assessment & Plan Note (Deleted)
Continues oral iron QOD.  

## 2020-12-18 NOTE — Assessment & Plan Note (Addendum)
Remains abstinent since 08/25/2018.  Looking into getting established with AA locally in [place].

## 2020-12-22 ENCOUNTER — Telehealth: Payer: Self-pay

## 2020-12-22 NOTE — Chronic Care Management (AMB) (Addendum)
Chronic Care Management Pharmacy Assistant   Name: Justin Benson  MRN: [id number] DOB: [birth year]   Reason for Encounter:  Medication Adherence and Delivery Coordination   Recent office visits:  12/18/20 - PCP labs ordered, No medication changes. Follow up 3 months  Recent consult visits:  None since last CCM contact  Hospital visits:  None in previous 6 months  Medications: Outpatient Encounter Medications as of 12/22/2020  Medication Sig   atorvastatin (LIPITOR) 10 MG tablet Take 0.5 tablets (5 mg total) by mouth daily.   carvedilol (COREG) 3.125 MG tablet Take 2 tablets (6.25 mg total) by mouth every morning AND 1 tablet (3.125 mg total) at bedtime.   Cyanocobalamin (B-12) 1000 MCG SUBL Place 1 tablet under the tongue daily.   famotidine (PEPCID) 20 MG tablet TAKE ONE TABLET BY MOUTH EVERYDAY AT BEDTIME   ferrous sulfate 325 (65 FE) MG tablet Take 1 tablet (325 mg total) by mouth every other day.   finasteride (PROSCAR) 5 MG tablet Take 1 tablet (5 mg total) by mouth every evening.   levothyroxine (SYNTHROID) 50 MCG tablet Take 1 tablet (50 mcg total) by mouth daily before breakfast.   metFORMIN (GLUCOPHAGE-XR) 500 MG 24 hr tablet Take 2 tablets (1,000 mg total) by mouth 2 (two) times daily.   Multiple Vitamin (MULTIVITAMIN WITH MINERALS) TABS Take 1 tablet by mouth daily.   omeprazole (PRILOSEC) 40 MG capsule Take 1 capsule (40 mg total) by mouth daily.   polyethylene glycol powder (GLYCOLAX/MIRALAX) 17 GM/SCOOP powder Take 8.5 g by mouth daily as needed for moderate constipation. Hold for diarrhea   quinapril (ACCUPRIL) 20 MG tablet Take 1 tablet (20 mg total) by mouth at bedtime.   quinapril-hydrochlorothiazide (ACCURETIC) 20-25 MG tablet Take 1 tablet by mouth daily.   sertraline (ZOLOFT) 50 MG tablet Take 1 tablet (50 mg total) by mouth daily.   No facility-administered encounter medications on file as of 12/22/2020.    BP Readings from Last 3 Encounters:  12/18/20  (!) 150/60  09/19/20 (!) 164/64  09/08/20 (!) 159/76    Lab Results  Component Value Date   HGBA1C 7.6 (H) 12/18/2020    Recent OV, Consult or Hospital visit:  12/18/20-PCP labs ordered no medication changes follow up 3 months Patient obtains medications through Adherence Packaging  30 Days   Last adherence delivery date:12/04/2020   Patient is due for next adherence delivery on: 01/03/2021  Spoke with patient on 12/22/2020 and reviewed medications and coordinated delivery.  This delivery to include: Adherence Packaging  30 Days  Packs: Quinapril 20 mg 1 tablet daily (1 evening meal) Omeprazole 40 mg 1 tablet daily (1 breakfast) Levothyroxine 50 mcg 1 tablet daily (1 before breakfast) Quinapril/HCTZ 20-25 mg 1 tablet daily (1 breakfast) Carvedilol 3.125 mg 3 tablets daily (2 tablets breakfast, 1 tablet evening meal) Sertraline 50 mg 1 tablet daily (1 breakfast) Finasteride 5 mg 1 tablet daily (1 evening meal) Metformin ER 500 mg 4 tablets daily (2 breakfast, 2 evening meal) Atorvastatin 10 mg 0.5 tablet daily (1/2 evening meal) Famotidine 20 mg 1 tablet daily (1 bedtime)  PRN/VIAL medications: none  Patient declined the following medications this month: OTC Ferrous sulfate 325 mg 1 tablet every other day (bedtime) - would like OTCs in packs once finished current supply OTC Mens multivitamin- 1 tablet daily (breakfast) OTC Polyethylene glycol 3350- PRN  No refill request needed from PCP.  Confirmed delivery date of 01/03/2021 advised patient that pharmacy will contact him the morning  of delivery.  Recent blood pressure readings are as follows: The patient states he did not have any recent BP's. He had PCP visit this week   Recent blood glucose readings are as follows: The patient states the BG's run from 120-130 fasting the patient reports he stays aware of his fasting range and checks his BG more if higher than 150. The patient restricts sugar intake with all meals.  Follow-Up:   Coordination of Enhanced Pharmacy Services and Pharmacist Review  Debbora Dus, CPP notified  Avel Sensor, [place] Assistant [phone]  I have reviewed the care management and care coordination activities outlined in this encounter and I am certifying that I agree with the content of this note. No further action required.  Debbora Dus, PharmD Clinical Pharmacist [facility] at [facility] [phone]

## 2020-12-23 ENCOUNTER — Other Ambulatory Visit: Payer: Self-pay | Admitting: Family Medicine

## 2020-12-23 DIAGNOSIS — R110 Nausea: Secondary | ICD-10-CM

## 2020-12-23 DIAGNOSIS — R7401 Elevation of levels of liver transaminase levels: Secondary | ICD-10-CM

## 2020-12-26 ENCOUNTER — Other Ambulatory Visit: Payer: Self-pay | Admitting: Family Medicine

## 2020-12-26 MED ORDER — PIOGLITAZONE HCL 15 MG PO TABS: 15.0000 mg | ORAL_TABLET | Freq: Every day | ORAL | 3 refills | Status: AC

## 2020-12-28 ENCOUNTER — Telehealth: Payer: Self-pay

## 2020-12-28 NOTE — Progress Notes (Signed)
    Chronic Care Management Pharmacy Assistant   Name: [name]  MRN: [id number] DOB: 03-25-1935   Reason for Encounter: Acute Medication Delivery Coordination   Recent office visits:  No visits since last CCM contact. 12/26/20- PCP Medication change. Patient to start pioglitazone 15 mg.    Medications: Outpatient Encounter Medications as of 12/28/2020  Medication Sig  . atorvastatin (LIPITOR) 10 MG tablet Take 0.5 tablets (5 mg total) by mouth daily.  . carvedilol (COREG) 3.125 MG tablet Take 2 tablets (6.25 mg total) by mouth every morning AND 1 tablet (3.125 mg total) at bedtime.  . Cyanocobalamin (B-12) 1000 MCG SUBL Place 1 tablet under the tongue daily.  . famotidine (PEPCID) 20 MG tablet TAKE ONE TABLET BY MOUTH EVERYDAY AT BEDTIME  . ferrous sulfate 325 (65 FE) MG tablet Take 1 tablet (325 mg total) by mouth every other day.  . finasteride (PROSCAR) 5 MG tablet Take 1 tablet (5 mg total) by mouth every evening.  Marland Kitchen levothyroxine (SYNTHROID) 50 MCG tablet Take 1 tablet (50 mcg total) by mouth daily before breakfast.  . metFORMIN (GLUCOPHAGE-XR) 500 MG 24 hr tablet Take 2 tablets (1,000 mg total) by mouth 2 (two) times daily.  . Multiple Vitamin (MULTIVITAMIN WITH MINERALS) TABS Take 1 tablet by mouth daily.  Marland Kitchen omeprazole (PRILOSEC) 40 MG capsule Take 1 capsule (40 mg total) by mouth daily.  . pioglitazone (ACTOS) 15 MG tablet Take 1 tablet (15 mg total) by mouth daily.  . polyethylene glycol powder (GLYCOLAX/MIRALAX) 17 GM/SCOOP powder Take 8.5 g by mouth daily as needed for moderate constipation. Hold for diarrhea  . quinapril (ACCUPRIL) 20 MG tablet Take 1 tablet (20 mg total) by mouth at bedtime.  . quinapril-hydrochlorothiazide (ACCURETIC) 20-25 MG tablet Take 1 tablet by mouth daily.  . sertraline (ZOLOFT) 50 MG tablet Take 1 tablet (50 mg total) by mouth daily.   No facility-administered encounter medications on file as of 12/28/2020.   Dr. Danise Mina started patient on  pioglitazone 15 mg on 12/26/20. Acute delivery from [facility] has been initiated for delivery on 12/28/20.  Debbora Dus, CPP notified  Margaretmary Dys, [place] Pharmacy Assistant [phone]

## 2021-01-11 ENCOUNTER — Ambulatory Visit
Admission: RE | Admit: 2021-01-11 | Discharge: 2021-01-11 | Disposition: A | Discharge status: Home / Self Care | Payer: Medicare Other | Source: Ambulatory Visit | Attending: Family Medicine | Admitting: Family Medicine

## 2021-01-11 DIAGNOSIS — R7401 Elevation of levels of liver transaminase levels: Secondary | ICD-10-CM

## 2021-01-11 DIAGNOSIS — R110 Nausea: Secondary | ICD-10-CM

## 2021-01-19 ENCOUNTER — Telehealth: Payer: Self-pay

## 2021-01-19 NOTE — Chronic Care Management (AMB) (Addendum)
Chronic Care Management Pharmacy Assistant   Name: Justin Benson  MRN: [id number] DOB: 11/08/1934  Reason for Encounter: Medication Adherence and Delivery Coordination   Recent office visits:  12/26/20- PCP - Start pioglitazone 15 mg daily   Recent consult visits:  None since last CCM contact  Hospital visits:  None in previous 6 months  Medications: Outpatient Encounter Medications as of 01/19/2021  Medication Sig   atorvastatin (LIPITOR) 10 MG tablet Take 0.5 tablets (5 mg total) by mouth daily.   carvedilol (COREG) 3.125 MG tablet Take 2 tablets (6.25 mg total) by mouth every morning AND 1 tablet (3.125 mg total) at bedtime.   Cyanocobalamin (B-12) 1000 MCG SUBL Place 1 tablet under the tongue daily.   famotidine (PEPCID) 20 MG tablet TAKE ONE TABLET BY MOUTH EVERYDAY AT BEDTIME   ferrous sulfate 325 (65 FE) MG tablet Take 1 tablet (325 mg total) by mouth every other day.   finasteride (PROSCAR) 5 MG tablet Take 1 tablet (5 mg total) by mouth every evening.   levothyroxine (SYNTHROID) 50 MCG tablet Take 1 tablet (50 mcg total) by mouth daily before breakfast.   metFORMIN (GLUCOPHAGE-XR) 500 MG 24 hr tablet Take 2 tablets (1,000 mg total) by mouth 2 (two) times daily.   Multiple Vitamin (MULTIVITAMIN WITH MINERALS) TABS Take 1 tablet by mouth daily.   omeprazole (PRILOSEC) 40 MG capsule Take 1 capsule (40 mg total) by mouth daily.   pioglitazone (ACTOS) 15 MG tablet Take 1 tablet (15 mg total) by mouth daily.   polyethylene glycol powder (GLYCOLAX/MIRALAX) 17 GM/SCOOP powder Take 8.5 g by mouth daily as needed for moderate constipation. Hold for diarrhea   quinapril (ACCUPRIL) 20 MG tablet Take 1 tablet (20 mg total) by mouth at bedtime.   quinapril-hydrochlorothiazide (ACCURETIC) 20-25 MG tablet Take 1 tablet by mouth daily.   sertraline (ZOLOFT) 50 MG tablet Take 1 tablet (50 mg total) by mouth daily.   No facility-administered encounter medications on file as of 01/19/2021.    BP Readings from Last 3 Encounters:  12/18/20 (!) 150/60  09/19/20 (!) 164/64  09/08/20 (!) 159/76    Lab Results  Component Value Date   HGBA1C 7.6 (H) 12/18/2020     No OVs, Consults, or hospital visits since last care coordination call / Pharmacist visit. Recent medication changes indicated: Patient to start pioglitazone 15 mg.  Last adherence delivery date: 01/03/21       Patient is due for next adherence delivery on: 01/30/2021- [place].   Spoke with patient on 01/22/21 reviewed medications and coordinated delivery.  This delivery to include: Adherence Packaging  30 Days (Requesting 90 DS since he is moving)  Packs: Quinapril 20 mg 1 tablet daily (1 evening meal) Omeprazole 40 mg 1 tablet daily (1 breakfast) Levothyroxine 50 mcg 1 tablet daily (1 before breakfast) Quinapril/HCTZ 20-25 mg 1 tablet daily (1 breakfast) Carvedilol 3.125 mg 3 tablets daily (2 tablets breakfast, 1 tablet evening meal) Sertraline 50 mg 1 tablet daily (1 breakfast) Finasteride 5 mg 1 tablet daily (1 evening meal) Metformin ER 500 mg 4 tablets daily (2 breakfast, 2 evening meal) Atorvastatin 10 mg 0.5 tablet daily (1/2 evening meal) Famotidine 20 mg 1 tablet daily (1 bedtime) Pioglitazone 15 mg 1 tablet daily (breakfast) OTC Ferrous sulfate 325 mg 1 tablet every other day (bedtime)  OTC Mens multivitamin- 1 tablet daily (breakfast)  PRN/VIAL medications: OTC Polyethylene glycol 3350- PRN  Patient declined the following medications this month:  none  Any concerns about your medications? No  How often do you forget or accidentally miss a dose? Rarely  Do you use a pillbox? No  Is patient in packaging Yes  If yes  What is the date on your next pill pack? 6/27  Any concerns or issues with your packaging? No concerns with packaging   No refill request needed from PCP.  Confirmed delivery date of 01/30/21, advised patient that pharmacy will contact him the morning of  delivery.  Recent blood pressure readings are as follows: Not checking. States he has already packed up his BP monitor.   Recent blood glucose readings are as follows: Has not checked in the last week or 2 weeks   Debbora Dus, CPP notified  Margaretmary Dys, [place] [phone]  I have reviewed the care management and care coordination activities outlined in this encounter and I am certifying that I agree with the content of this note. No further action required.  Debbora Dus, PharmD Clinical Pharmacist [facility] at [facility] [phone]

## 2021-01-26 ENCOUNTER — Telehealth: Payer: Self-pay

## 2021-01-26 NOTE — Telephone Encounter (Signed)
Pt left v/m requesting to cancel appt with Dr Darnell Level on 03/20/21 (done as requested) and pt request cb with results of 01/11/21 [place] of abdomen. Pt is moving to [place] on 01/31/21 and pt will not be available by phone after 01/30/21. Sending note to [facility].

## 2021-01-26 NOTE — Telephone Encounter (Signed)
Left message on vm per dpr relaying results of abd [place].

## 2021-03-20 ENCOUNTER — Ambulatory Visit: Payer: Medicare Other | Admitting: Family Medicine

## 2021-04-05 ENCOUNTER — Telehealth: Payer: Self-pay | Admitting: *Deleted

## 2021-04-05 NOTE — Chronic Care Management (AMB) (Signed)
Pt moved to [place] and declines further CCM appointments or follow ups

## 2021-04-09 ENCOUNTER — Telehealth: Payer: Medicare Other

## 2021-04-10 ENCOUNTER — Telehealth: Payer: Medicare Other

## 2021-04-16 ENCOUNTER — Other Ambulatory Visit: Payer: Self-pay | Admitting: Gastroenterology

## 2021-07-25 IMAGING — US US ABDOMEN COMPLETE
1 series · 14 of 25 positions shown · non-contrast
Comparison: CT 09/10/2019

CLINICAL DATA: Transaminitis and nausea.

EXAM:
ABDOMEN ULTRASOUND COMPLETE

[Series 1: us abdomen complete · 0.25mm/px · 14 of 87 slices shown]
[im 1/87]
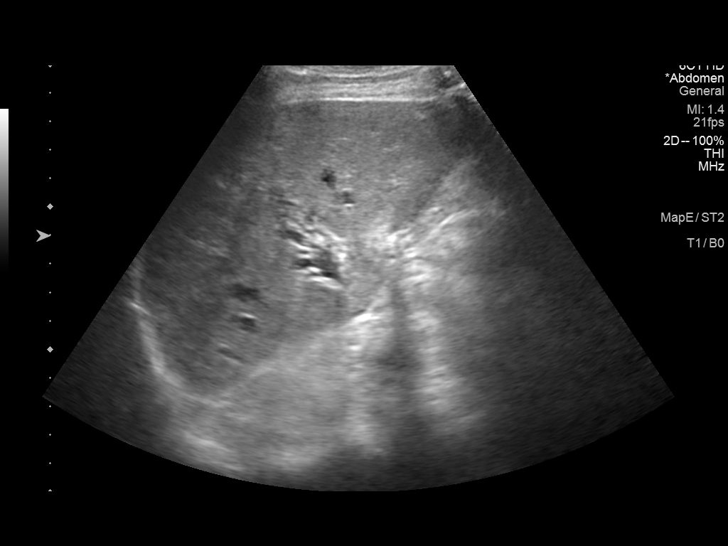
[im 8/87]
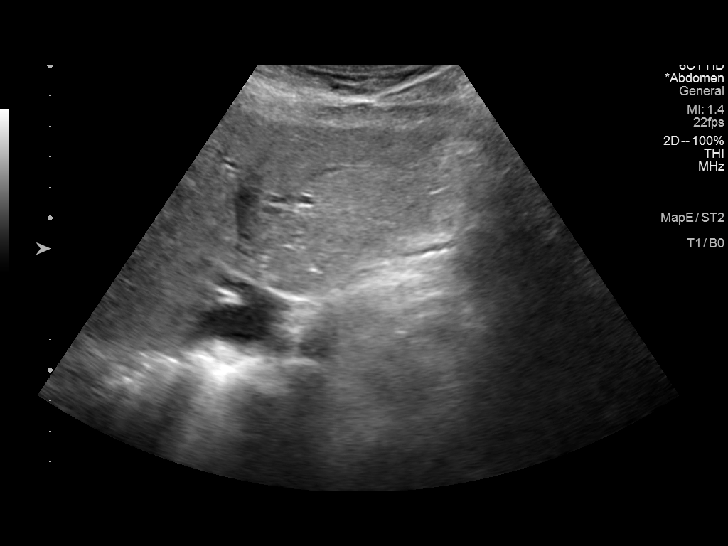
[im 15/87]
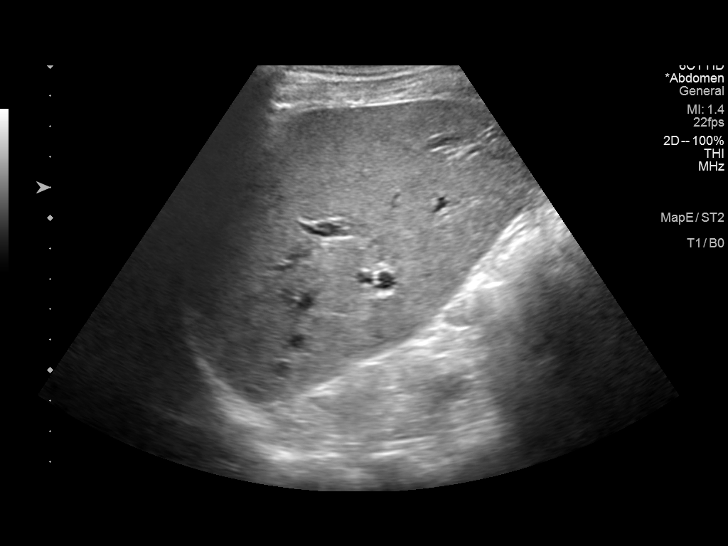
[im 22/87]
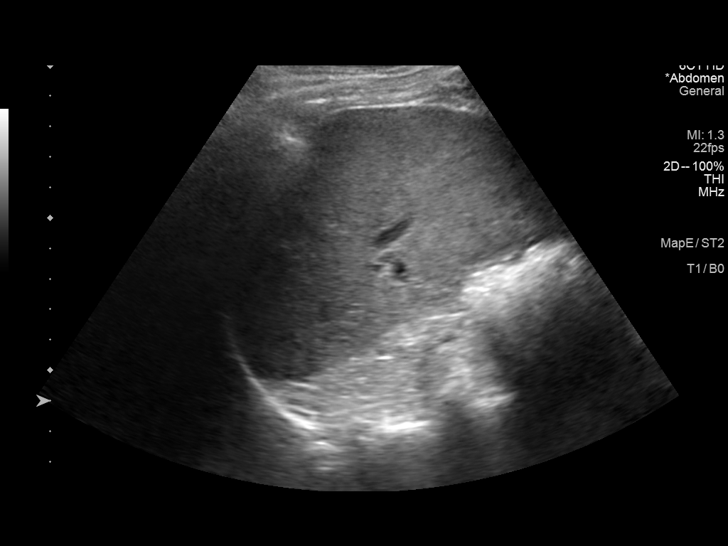
[im 29/87]
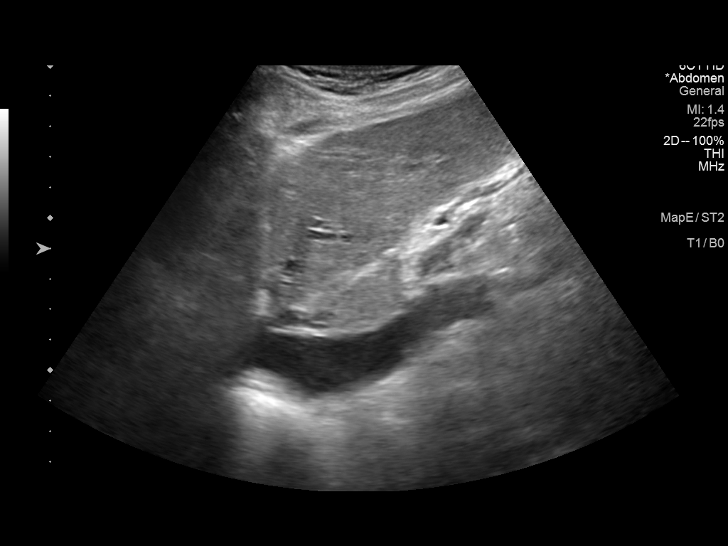
[im 33/87]
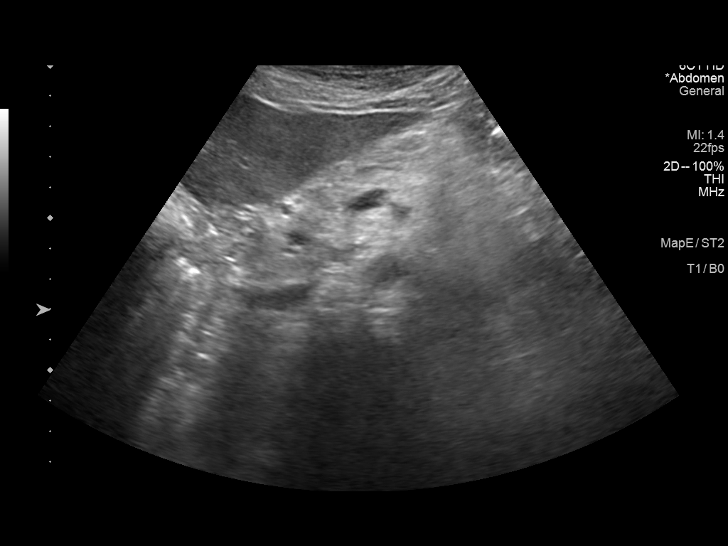
[im 40/87]
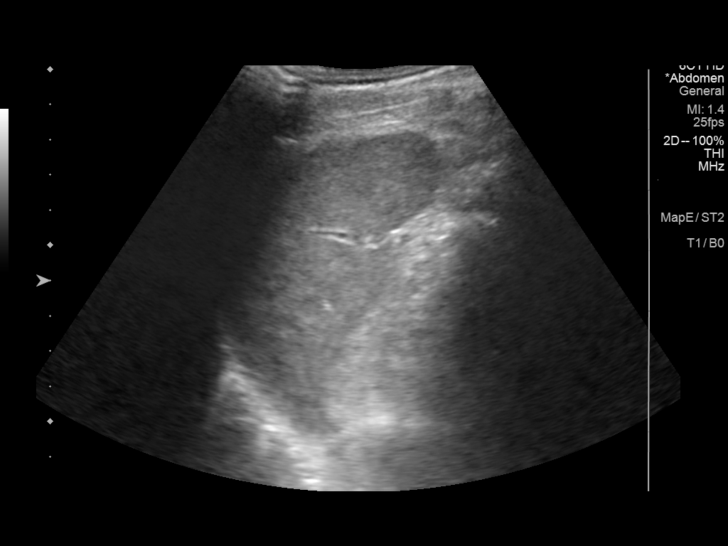
[im 47/87]
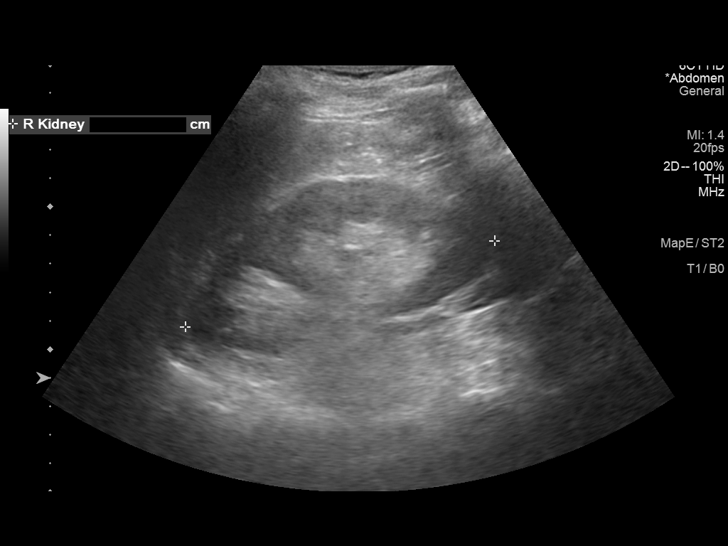
[im 54/87]
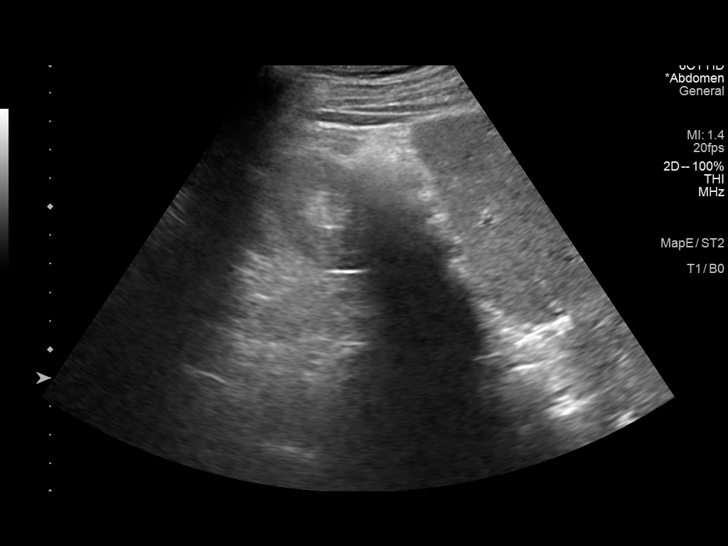
[im 58/87]
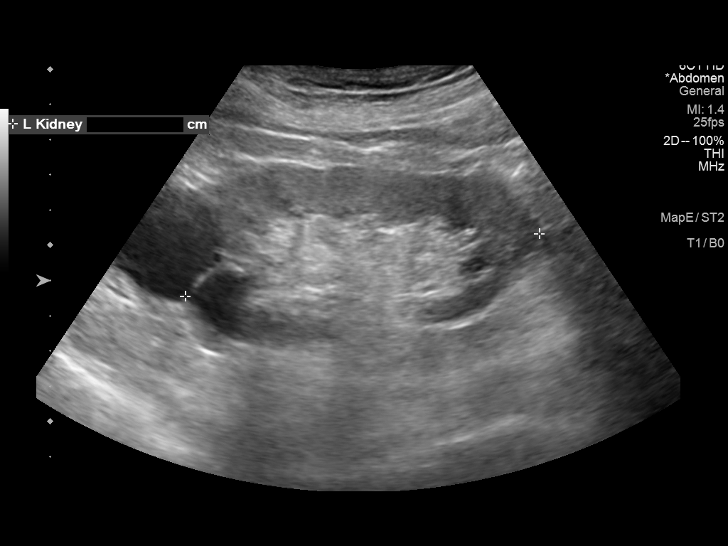
[im 65/87]
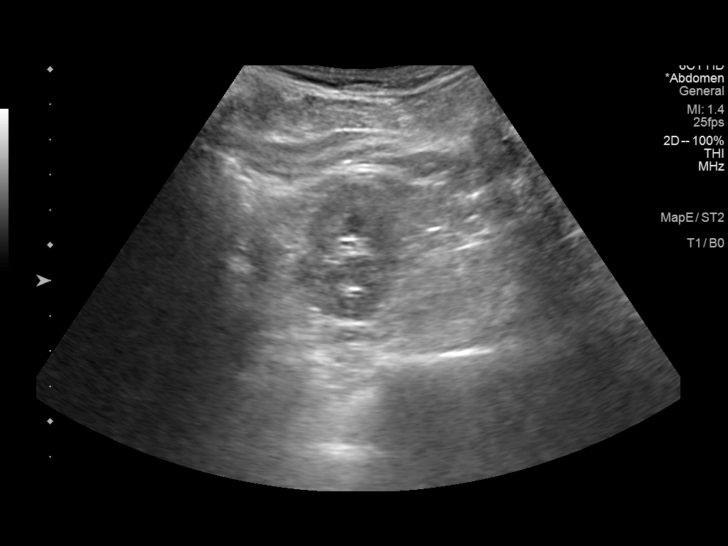
[im 72/87]
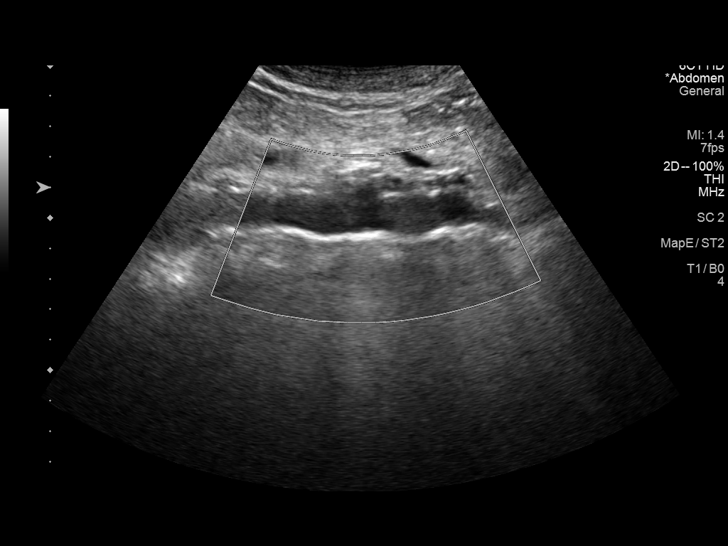
[im 79/87]
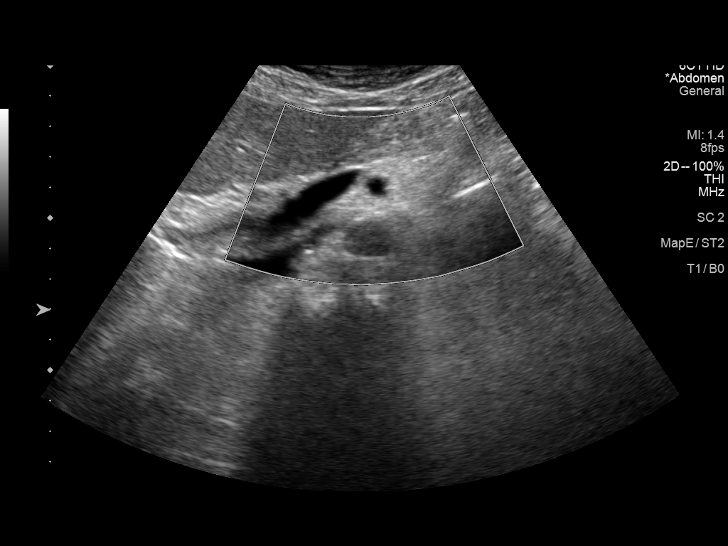
[im 87/87]
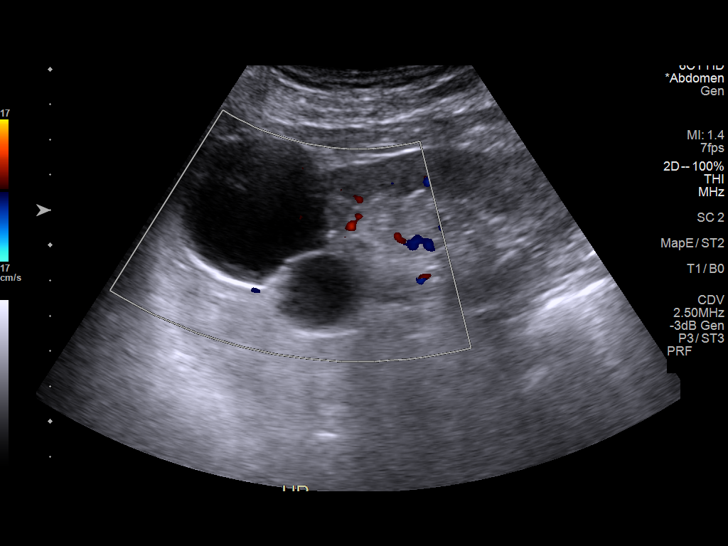

[14 of 25 positions shown; findings below may reference images not displayed]

FINDINGS: Gallbladder: Gallbladder is surgically absent.

Common bile duct: Diameter: 4 mm, normal

Liver: No focal lesion identified. Within normal limits in
parenchymal echogenicity. Portal vein is patent on color Doppler
imaging with normal direction of blood flow towards the liver.

IVC: No abnormality visualized.

Pancreas: Visualized portion unremarkable.

Spleen: Size and appearance within normal limits.

Right Kidney: Length: 11.3 cm. Echogenicity within normal limits. No
mass or hydronephrosis visualized.

Left Kidney: Length: 10.2 cm. Cysts demonstrated in the upper pole
of the left kidney measuring 4.2 and 2.2 cm maximal diameter.
Similar appearance to previous CT. No solid mass or hydronephrosis.

Abdominal aorta: No aneurysm visualized.

Other findings: None.
IMPRESSION: No acute process demonstrated. No bile duct dilatation. Stable
appearance of left upper pole renal cysts.

## 2021-09-07 NOTE — Progress Notes (Deleted)
Subjective:   Justin Benson is a 87 y.o. male who presents for Medicare Annual/Subsequent preventive examination.  I connected with Chapman Moss today by telephone and verified that I am speaking with the correct person using two identifiers. Location patient: home Location provider: work Persons participating in the virtual visit: patient, Marine scientist.    I discussed the limitations, risks, security and privacy concerns of performing an evaluation and management service by telephone and the availability of in person appointments. I also discussed with the patient that there may be a patient responsible charge related to this service. The patient expressed understanding and verbally consented to this telephonic visit.    Interactive audio and video telecommunications were attempted between this provider and patient, however failed, due to patient having technical difficulties OR patient did not have access to video capability.  We continued and completed visit with audio only.  Some vital signs may be absent or patient reported.   Time Spent with patient on telephone encounter: *** minutes  Review of Systems           Objective:    There were no vitals filed for this visit. There is no height or weight on file to calculate BMI.  Advanced Directives 08/28/2018 08/20/2017 06/10/2016 02/28/2014 02/26/2013 01/21/2013 12/16/2012  Does Patient Have a Medical Advance Directive? Yes Yes Yes Patient has advance directive, copy not in chart Patient has advance directive, copy not in chart Patient has advance directive, copy not in chart Patient has advance directive, copy not in chart  Type of Advance Directive [place];Living will [place];Living will [place];Living will - [place];Living will [place];Living will [place];Living will  Does patient want to make changes to medical advance  directive? - - No - Patient declined - - - -  Copy of [place] in Chart? Yes - validated most recent copy scanned in chart (See row information) Yes Yes - Copy requested from family - Copy requested from family  Pre-existing out of facility DNR order (yellow form or pink MOST form) - - - - - No -    Current Medications (verified) Outpatient Encounter Medications as of 09/10/2021  Medication Sig   atorvastatin (LIPITOR) 10 MG tablet Take 0.5 tablets (5 mg total) by mouth daily.   carvedilol (COREG) 3.125 MG tablet Take 2 tablets (6.25 mg total) by mouth every morning AND 1 tablet (3.125 mg total) at bedtime.   Cyanocobalamin (B-12) 1000 MCG SUBL Place 1 tablet under the tongue daily.   famotidine (PEPCID) 20 MG tablet TAKE ONE TABLET BY MOUTH EVERYDAY AT BEDTIME   ferrous sulfate 325 (65 FE) MG tablet Take 1 tablet (325 mg total) by mouth every other day.   finasteride (PROSCAR) 5 MG tablet Take 1 tablet (5 mg total) by mouth every evening.   levothyroxine (SYNTHROID) 50 MCG tablet Take 1 tablet (50 mcg total) by mouth daily before breakfast.   metFORMIN (GLUCOPHAGE-XR) 500 MG 24 hr tablet Take 2 tablets (1,000 mg total) by mouth 2 (two) times daily.   Multiple Vitamin (MULTIVITAMIN WITH MINERALS) TABS Take 1 tablet by mouth daily.   omeprazole (PRILOSEC) 40 MG capsule Take 1 capsule (40 mg total) by mouth daily.   pioglitazone (ACTOS) 15 MG tablet Take 1 tablet (15 mg total) by mouth daily.   polyethylene glycol powder (GLYCOLAX/MIRALAX) 17 GM/SCOOP powder Take 8.5 g by mouth daily as needed for  moderate constipation. Hold for diarrhea   quinapril (ACCUPRIL) 20 MG tablet Take 1 tablet (20 mg total) by mouth at bedtime.   quinapril-hydrochlorothiazide (ACCURETIC) 20-25 MG tablet Take 1 tablet by mouth daily.   sertraline (ZOLOFT) 50 MG tablet Take 1 tablet (50 mg total) by mouth daily.   No facility-administered encounter medications on file as of 09/10/2021.    Allergies  (verified) Amlodipine   History: Past Medical History:  Diagnosis Date   AAA (abdominal aortic aneurysm) ([place]) 05/2012   mild 2.3 cm , rpt 1 year   Alcoholic hepatitis without ascites 06/02/2014   Normal viral hep panel, normal abd [place] (2015)   Anemia    Anxiety    Arthritis    fingers and hip   BCC (basal cell carcinoma of skin) 11/2018   L leg ([place])   BPH (benign prostatic hypertrophy)    Choledocholithiasis 11/2012   s/p ERCP, s/p lap chole   COPD (chronic obstructive pulmonary disease) ([place])    Depression    Diabetes mellitus 4/02   GERD (gastroesophageal reflux disease)    Hyperlipidemia    Hypertension 1996   Pneumonia as a baby   Radial nerve palsy 06/02/2014   Schwannoma of spinal cord ([place]) 11/2012, 09/2015   medial to R psoas muscle, 4cm size   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 Elvera Lennox, [facility]   CHOLECYSTECTOMY N/A 03/08/2013   Procedure: LAPAROSCOPIC CHOLECYSTECTOMY WITH INTRAOPERATIVE CHOLANGIOGRAM;  Surgeon: Harl Bowie, MD;  Location: [facility];  Service: General;  Laterality: N/A;   CIRCUMCISION  10/30/09   Dr Gaynelle Arabian   COLONOSCOPY  11/2008   2 small polyps (1 tubular), diverticulosis, lipoma, int/ext hemorrhoids [name])   COLONOSCOPY  02/2014   1 polyp, diverticulosis, no rpt needed [name])   COLONOSCOPY  01/2018   1 TA [name])   ENDOSCOPIC RETROGRADE CHOLANGIOPANCREATOGRAPHY (ERCP) WITH PROPOFOL N/A 12/24/2012   Procedure: ENDOSCOPIC RETROGRADE CHOLANGIOPANCREATOGRAPHY (ERCP) WITH PROPOFOL;  Surgeon: Milus Banister, MD;  Location: [facility];  Service: Endoscopy;   Laterality: N/A;   ESOPHAGOGASTRODUODENOSCOPY  01/2018   friable duodenal mucosa, mild gastritis s/p benign biopsies [name])   LAPAROSCOPIC CHOLECYSTECTOMY  02/2013   Dr. Ninfa Linden   Family History  Problem Relation Age of Onset   Prostate cancer Father    Aneurysm Father        aortic   Stroke Father    Cancer Sister        bladder (nonsmoker)   Hodgkin's lymphoma Brother 78   Prostate cancer Brother 78       1/2   Prostate cancer Brother 7       1/2   Other Sister        cerebral hemm   Lupus Sister    Diabetes Brother    Colon cancer Neg Hx    Social History   Socioeconomic History   Marital status: Single    Spouse name: Not on file   Number of children: 1   Years of education: Not on file   Highest education level: Not  on file  Occupational History   Occupation: Chartered certified accountant: retired    Comment: retired 1999  Tobacco Use   Smoking status: Former    Packs/day: 2.00    Years: 30.00    Pack years: 60.00    Types: Cigarettes    Start date: 07/29/1962    Quit date: 07/29/1992    Years since quitting: 29.1   Smokeless tobacco: Never  Vaping Use   Vaping Use: Never used  Substance and Sexual Activity   Alcohol use: Not Currently   Drug use: No   Sexual activity: Yes  Other Topics Concern   Not on file  Social History Narrative   Divorced since 73   Wife with post partum depression committed suicide remotely   Lives alone   1 daughter 1, widowed   Occupation:retired, worked for [employer] (supervised foster care, 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   Social Determinants of Health   Financial Resource Strain: Low Risk    Difficulty of Paying Living Expenses: Not very hard  Food Insecurity: No Food Insecurity   Worried About Charity fundraiser in the Last Year: Never true   Ran Out of Food in the Last Year: Never true  Transportation  Needs: No Transportation Needs   Lack of Transportation (Medical): No   Lack of Transportation (Non-Medical): No  Physical Activity: Sufficiently Active   Days of Exercise per Week: 7 days   Minutes of Exercise per Session: 30 min  Stress: Stress Concern Present   Feeling of Stress : To some extent  Social Connections: Not on file    Tobacco Counseling Counseling given: Not Answered   Clinical Intake:                 Diabetes:  Is the patient diabetic?  Yes  If diabetic, was a CBG obtained today?  {YES/NO:21197} Did the patient bring in their glucometer from home?  No  How often do you monitor your CBG's? ***.   Financial Strains and Diabetes Management:  Are you having any financial strains with the device, your supplies or your medication? {YES/NO:21197}.  Does the patient want to be seen by Chronic Care Management for management of their diabetes?  {YES/NO:21197} Would the patient like to be referred to a Nutritionist or for Diabetic Management?  {YES/NO:21197}  Diabetic Exams:  Diabetic Eye Exam: Completed ***. Overdue for diabetic eye exam. Pt has been advised about the importance in completing this exam. A referral has been placed today. Message sent to referral coordinator for scheduling purposes. Advised pt to expect a call from our office re: appt.  Diabetic Foot Exam: Completed 12/18/20.           Activities of Daily Living In your present state of health, do you have any difficulty performing the following activities: 09/08/2020  Hearing? Y  Comment some hearing loss noted  Vision? N  Difficulty concentrating or making decisions? Y  Comment some trouble remembering names, places, people, etc  Walking or climbing stairs? N  Dressing or bathing? N  Doing errands, shopping? N  Preparing Food and eating ? N  Using the Toilet? N  In the past six months, have you accidently leaked urine? Y  Comment wears liners  Do you have problems with loss of  bowel control? N  Managing your Medications? N  Managing your Finances? N  Housekeeping or managing your Housekeeping? N  Some recent data might be hidden    Patient Care Team: [name], MD as PCP - General (Family Medicine) Melida Quitter, MD as Consulting Physician (Otolaryngology) Harriett Sine, MD as Consulting Physician (Dermatology) Fanny Dance, DDS as Referring Physician (Dentistry) Debbora Dus, [facility] as Pharmacist (Pharmacist)  Indicate any recent Medical Services you may have received from other than [facility] providers in the past year (date may be approximate).     Assessment:   This is a routine wellness examination for [place].  Hearing/Vision screen No results found.  Dietary issues and exercise activities discussed:     Goals Addressed   None    Depression Screen PHQ 2/9 Scores 09/08/2020 09/06/2019 08/28/2018 08/20/2017 06/10/2016 06/07/2015 06/02/2014  PHQ - 2 Score 4 1 4 2 2 2  0  PHQ- 9 Score 4 4 16 4 6 2  -    Fall Risk Fall Risk  09/08/2020 09/06/2019 08/28/2018 08/20/2017 06/10/2016  Falls in the past year? 1 0 0 No No  Comment - - - - -  Number falls in past yr: 0 - - - -  Injury with Fall? 1 - - - -  Comment hurt shoulder - - - -  Risk for fall due to : Medication side effect;Impaired balance/gait - - - -  Risk for fall due to: Comment - - - - -  Follow up Falls evaluation completed;Falls prevention discussed - - - -    FALL RISK PREVENTION PERTAINING TO THE HOME:  Any stairs in or around the home? {YES/NO:21197} If so, are there any without handrails? {YES/NO:21197} Home free of loose throw rugs in walkways, pet beds, electrical cords, etc? {YES/NO:21197} Adequate lighting in your home to reduce risk of falls? {YES/NO:21197}  ASSISTIVE DEVICES UTILIZED TO PREVENT FALLS:  Life alert? {YES/NO:21197} Use of a cane, walker or w/c? {YES/NO:21197} Grab bars in the bathroom? {YES/NO:21197} Shower chair or bench in shower?  {YES/NO:21197} Elevated toilet seat or a handicapped toilet? {YES/NO:21197}  TIMED UP AND GO:  Was the test performed? No .    Cognitive Function: MMSE - Mini Mental State Exam 09/08/2020 08/28/2018 08/20/2017 06/10/2016  Orientation to time 5 5 5 5   Orientation to Place 5 5 5 5   Registration 3 3 3 3   Attention/ Calculation 5 0 0 0  Recall 3 3 3 3   Language- name 2 objects - 0 0 0  Language- repeat 1 1 1 1   Language- follow 3 step command - 3 3 3   Language- read & follow direction - 0 0 0  Write a sentence - 0 0 0  Copy design - 0 0 0  Total score - 20 20 20         Immunizations Immunization History  Administered Date(s) Administered   Fluad Quad(high Dose 65+) 05/07/2019, 05/10/2020   Influenza Split 05/08/2011, 05/29/2012   Influenza Whole 04/28/2006, 06/17/2007, 05/15/2008, 05/07/2010   Influenza,inj,Quad PF,6+ Mos 06/02/2014, 06/07/2015   Influenza,inj,quad, With Preservative 04/23/2017, 05/01/2018   Influenza-Unspecified 05/17/2013, 04/15/2016   PFIZER(Purple Top)SARS-COV-2 Vaccination 08/13/2019, 09/02/2019, 04/28/2020   Pneumococcal Conjugate-13 06/02/2014   Pneumococcal Polysaccharide-23 06/29/2003   Td 12/28/1998, 08/28/2009   Zoster Recombinat (Shingrix) 11/06/2017, 02/25/2018   Zoster, Live 04/03/2011    TDAP status: Due, Education has been provided regarding the importance of this vaccine. Advised may receive this vaccine at local pharmacy or Health Dept. Aware to provide a copy of the vaccination record if obtained from local pharmacy or Health Dept. Verbalized acceptance  and understanding.  {Flu Vaccine status:2101806}  Pneumococcal vaccine status: Up to date  {Covid-19 vaccine status:2101808}  Qualifies for Shingles Vaccine? Yes   Zostavax completed Yes   Shingrix Completed?: Yes  Screening Tests Health Maintenance  Topic Date Due   COVID-19 Vaccine (4 - Booster for Pfizer series) 06/23/2020   INFLUENZA VACCINE  02/26/2021   OPHTHALMOLOGY EXAM   03/21/2021   HEMOGLOBIN A1C  06/20/2021   TETANUS/TDAP  09/09/2023 (Originally 08/29/2019)   FOOT EXAM  12/18/2021   Pneumonia Vaccine 87+ Years old  Completed   Zoster Vaccines- Shingrix  Completed   HPV VACCINES  Aged Out    Health Maintenance  Health Maintenance Due  Topic Date Due   COVID-19 Vaccine (4 - Booster for Pfizer series) 06/23/2020   INFLUENZA VACCINE  02/26/2021   OPHTHALMOLOGY EXAM  03/21/2021   HEMOGLOBIN A1C  06/20/2021    Colorectal cancer screening: No longer required.   Lung Cancer Screening: (Low Dose CT Chest recommended if Age 25-80 years, 30 pack-year currently smoking OR have quit w/in 15years.) does qualify.   Lung Cancer Screening Referral: ***  Additional Screening:  Hepatitis C Screening: does not qualify  Vision Screening: Recommended annual ophthalmology exams for early detection of glaucoma and other disorders of the eye. Is the patient up to date with their annual eye exam?  {YES/NO:21197} Who is the provider or what is the name of the office in which the patient attends annual eye exams? *** If pt is not established with a provider, would they like to be referred to a provider to establish care? {YES/NO:21197}.   Dental Screening: Recommended annual dental exams for proper oral hygiene  Community Resource Referral / Chronic Care Management: CRR required this visit?  {YES/NO:21197}  CCM required this visit?  {YES/NO:21197}     Plan:     I have personally reviewed and noted the following in the patients chart:   Medical and social history Use of alcohol, tobacco or illicit drugs  Current medications and supplements including opioid prescriptions. {Opioid Prescriptions:[phone]} Functional ability and status Nutritional status Physical activity Advanced directives List of other physicians Hospitalizations, surgeries, and ER visits in previous 12 months Vitals Screenings to include cognitive, depression, and falls Referrals  and appointments  In addition, I have reviewed and discussed with patient certain preventive protocols, quality metrics, and best practice recommendations. A written personalized care plan for preventive services as well as general preventive health recommendations were provided to patient.   Due to this being a telephonic visit, the after visit summary with patients personalized plan was offered to patient via mail or my-chart. ***Patient declined at this time./ Patient would like to access on my-chart/ per request, patient was mailed a copy of AVS./ Patient preferred to pick up at office at next visit.   Loma Messing, LPN   3/00/9233   Nurse Health Advisor  Nurse Notes: none

## 2021-09-10 ENCOUNTER — Ambulatory Visit: Payer: Medicare Other
